# Patient Record
Sex: Male | Born: 1935 | Race: White | Hispanic: No | Smoking: Current some day smoker
Health system: Southern US, Community
[De-identification: ages and names within clinical notes are randomized; demographics above are authoritative.]

## PROBLEM LIST (undated history)

## (undated) DIAGNOSIS — N189 Chronic kidney disease, unspecified: Secondary | ICD-10-CM

## (undated) DIAGNOSIS — R569 Unspecified convulsions: Secondary | ICD-10-CM

## (undated) DIAGNOSIS — F419 Anxiety disorder, unspecified: Secondary | ICD-10-CM

## (undated) DIAGNOSIS — I10 Essential (primary) hypertension: Secondary | ICD-10-CM

## (undated) DIAGNOSIS — N390 Urinary tract infection, site not specified: Secondary | ICD-10-CM

## (undated) DIAGNOSIS — F32A Depression, unspecified: Secondary | ICD-10-CM

## (undated) DIAGNOSIS — F329 Major depressive disorder, single episode, unspecified: Secondary | ICD-10-CM

## (undated) DIAGNOSIS — R7881 Bacteremia: Secondary | ICD-10-CM

## (undated) DIAGNOSIS — D649 Anemia, unspecified: Secondary | ICD-10-CM

## (undated) DIAGNOSIS — D472 Monoclonal gammopathy: Secondary | ICD-10-CM

## (undated) DIAGNOSIS — Z978 Presence of other specified devices: Secondary | ICD-10-CM

## (undated) DIAGNOSIS — K5792 Diverticulitis of intestine, part unspecified, without perforation or abscess without bleeding: Secondary | ICD-10-CM

## (undated) DIAGNOSIS — Z960 Presence of urogenital implants: Secondary | ICD-10-CM

## (undated) DIAGNOSIS — N400 Enlarged prostate without lower urinary tract symptoms: Secondary | ICD-10-CM

## (undated) DIAGNOSIS — N289 Disorder of kidney and ureter, unspecified: Secondary | ICD-10-CM

## (undated) DIAGNOSIS — F4310 Post-traumatic stress disorder, unspecified: Secondary | ICD-10-CM

## (undated) DIAGNOSIS — J189 Pneumonia, unspecified organism: Secondary | ICD-10-CM

## (undated) HISTORY — PX: TONSILLECTOMY: SUR1361

---

## 2006-08-28 ENCOUNTER — Inpatient Hospital Stay: Payer: Self-pay | Admitting: Internal Medicine

## 2006-08-28 ENCOUNTER — Other Ambulatory Visit: Payer: Self-pay

## 2007-08-25 ENCOUNTER — Inpatient Hospital Stay: Payer: Self-pay | Admitting: Cardiovascular Disease

## 2007-08-25 ENCOUNTER — Other Ambulatory Visit: Payer: Self-pay

## 2007-09-21 ENCOUNTER — Ambulatory Visit: Payer: Self-pay | Admitting: Internal Medicine

## 2007-09-22 ENCOUNTER — Ambulatory Visit: Payer: Self-pay | Admitting: Internal Medicine

## 2008-10-17 ENCOUNTER — Ambulatory Visit: Payer: Self-pay | Admitting: Internal Medicine

## 2008-10-23 ENCOUNTER — Emergency Department: Payer: Self-pay | Admitting: Emergency Medicine

## 2008-11-02 ENCOUNTER — Emergency Department: Payer: Self-pay | Admitting: Emergency Medicine

## 2008-11-20 ENCOUNTER — Ambulatory Visit: Payer: Self-pay | Admitting: Ophthalmology

## 2009-07-06 ENCOUNTER — Inpatient Hospital Stay: Payer: Self-pay | Admitting: Internal Medicine

## 2009-07-20 ENCOUNTER — Other Ambulatory Visit: Payer: Self-pay | Admitting: Family Medicine

## 2009-09-22 ENCOUNTER — Inpatient Hospital Stay: Payer: Self-pay | Admitting: Specialist

## 2009-10-01 ENCOUNTER — Emergency Department: Payer: Self-pay | Admitting: Internal Medicine

## 2009-12-05 ENCOUNTER — Ambulatory Visit: Payer: Self-pay | Admitting: Internal Medicine

## 2009-12-11 ENCOUNTER — Inpatient Hospital Stay: Payer: Self-pay | Admitting: Internal Medicine

## 2010-01-04 ENCOUNTER — Ambulatory Visit: Payer: Self-pay | Admitting: Internal Medicine

## 2010-01-16 ENCOUNTER — Ambulatory Visit: Payer: Self-pay | Admitting: Internal Medicine

## 2010-02-04 ENCOUNTER — Ambulatory Visit: Payer: Self-pay | Admitting: Internal Medicine

## 2010-03-07 ENCOUNTER — Ambulatory Visit: Payer: Self-pay | Admitting: Internal Medicine

## 2010-04-06 ENCOUNTER — Ambulatory Visit: Payer: Self-pay | Admitting: Internal Medicine

## 2010-04-18 ENCOUNTER — Ambulatory Visit: Payer: Self-pay | Admitting: Internal Medicine

## 2010-05-07 ENCOUNTER — Ambulatory Visit: Payer: Self-pay | Admitting: Internal Medicine

## 2010-11-01 ENCOUNTER — Ambulatory Visit: Payer: Self-pay | Admitting: Internal Medicine

## 2010-11-05 ENCOUNTER — Ambulatory Visit: Payer: Self-pay | Admitting: Internal Medicine

## 2011-08-02 ENCOUNTER — Emergency Department: Payer: Self-pay | Admitting: *Deleted

## 2011-08-05 ENCOUNTER — Ambulatory Visit: Payer: Self-pay | Admitting: Internal Medicine

## 2011-08-05 LAB — CBC CANCER CENTER
Basophil %: 0.3 %
Eosinophil #: 0 x10 3/mm (ref 0.0–0.7)
Eosinophil %: 0.3 %
HGB: 12.7 g/dL — ABNORMAL LOW (ref 13.0–18.0)
Lymphocyte #: 0.9 x10 3/mm — ABNORMAL LOW (ref 1.0–3.6)
MCH: 32.1 pg (ref 26.0–34.0)
MCHC: 33.3 g/dL (ref 32.0–36.0)
MCV: 97 fL (ref 80–100)
Monocyte #: 0.5 x10 3/mm (ref 0.0–0.7)
Neutrophil #: 9.2 x10 3/mm — ABNORMAL HIGH (ref 1.4–6.5)
Neutrophil %: 87 %
RBC: 3.96 10*6/uL — ABNORMAL LOW (ref 4.40–5.90)

## 2011-08-05 LAB — COMPREHENSIVE METABOLIC PANEL
Albumin: 2.8 g/dL — ABNORMAL LOW (ref 3.4–5.0)
Anion Gap: 7 (ref 7–16)
Calcium, Total: 9.3 mg/dL (ref 8.5–10.1)
Glucose: 132 mg/dL — ABNORMAL HIGH (ref 65–99)
Osmolality: 285 (ref 275–301)
Potassium: 4.4 mmol/L (ref 3.5–5.1)
Sodium: 139 mmol/L (ref 136–145)
Total Protein: 8.8 g/dL — ABNORMAL HIGH (ref 6.4–8.2)

## 2011-08-08 ENCOUNTER — Ambulatory Visit: Payer: Self-pay | Admitting: Internal Medicine

## 2011-08-19 LAB — CBC CANCER CENTER
Bands: 1 %
Comment - H1-Com3: NORMAL
Eosinophil %: 1 %
Eosinophil: 3 %
HCT: 32.4 % — ABNORMAL LOW (ref 40.0–52.0)
HGB: 11.2 g/dL — ABNORMAL LOW (ref 13.0–18.0)
Lymphocyte #: 1.6 x10 3/mm (ref 1.0–3.6)
Lymphocyte %: 16.5 %
Lymphocytes: 17 %
Monocyte %: 6.3 %
Monocytes: 4 %
Neutrophil #: 7.5 x10 3/mm — ABNORMAL HIGH (ref 1.4–6.5)
Neutrophil %: 75.8 %
RDW: 15.4 % — ABNORMAL HIGH (ref 11.5–14.5)
WBC: 9.8 x10 3/mm (ref 3.8–10.6)

## 2011-08-19 LAB — RETICULOCYTES
Absolute Retic Count: 0.107 10*6/uL — ABNORMAL HIGH (ref 0.024–0.084)
Reticulocyte: 3.1 % — ABNORMAL HIGH (ref 0.5–1.5)

## 2011-09-05 ENCOUNTER — Ambulatory Visit: Payer: Self-pay | Admitting: Internal Medicine

## 2011-12-06 ENCOUNTER — Ambulatory Visit: Payer: Self-pay | Admitting: Internal Medicine

## 2011-12-20 ENCOUNTER — Inpatient Hospital Stay: Payer: Self-pay | Admitting: Internal Medicine

## 2011-12-20 LAB — DRUG SCREEN, URINE
Amphetamines, Ur Screen: NEGATIVE (ref ?–1000)
Benzodiazepine, Ur Scrn: POSITIVE (ref ?–200)
Cannabinoid 50 Ng, Ur ~~LOC~~: NEGATIVE (ref ?–50)
Cocaine Metabolite,Ur ~~LOC~~: NEGATIVE (ref ?–300)
Methadone, Ur Screen: NEGATIVE (ref ?–300)
Phencyclidine (PCP) Ur S: NEGATIVE (ref ?–25)
Tricyclic, Ur Screen: NEGATIVE (ref ?–1000)

## 2011-12-20 LAB — COMPREHENSIVE METABOLIC PANEL
Alkaline Phosphatase: 63 U/L (ref 50–136)
Anion Gap: 5 — ABNORMAL LOW (ref 7–16)
Creatinine: 1.4 mg/dL — ABNORMAL HIGH (ref 0.60–1.30)
EGFR (African American): 52 — ABNORMAL LOW
EGFR (Non-African Amer.): 45 — ABNORMAL LOW
Glucose: 81 mg/dL (ref 65–99)
Osmolality: 283 (ref 275–301)
SGOT(AST): 30 U/L (ref 15–37)
Total Protein: 8.2 g/dL (ref 6.4–8.2)

## 2011-12-20 LAB — CBC WITH DIFFERENTIAL/PLATELET
Basophil #: 0 10*3/uL (ref 0.0–0.1)
Basophil %: 0.3 %
Eosinophil %: 1.2 %
Lymphocyte #: 1.1 10*3/uL (ref 1.0–3.6)
Neutrophil %: 78.3 %
Platelet: 182 10*3/uL (ref 150–440)

## 2011-12-20 LAB — TROPONIN I: Troponin-I: 0.02 ng/mL

## 2011-12-20 LAB — URINALYSIS, COMPLETE
Glucose,UR: NEGATIVE mg/dL (ref 0–75)
Ketone: NEGATIVE
Ph: 5 (ref 4.5–8.0)
RBC,UR: 76 /HPF (ref 0–5)
Squamous Epithelial: <1

## 2011-12-21 LAB — MAGNESIUM: Magnesium: 1.9 mg/dL

## 2011-12-21 LAB — BASIC METABOLIC PANEL
BUN: 25 mg/dL — ABNORMAL HIGH (ref 7–18)
Calcium, Total: 8.3 mg/dL — ABNORMAL LOW (ref 8.5–10.1)
Chloride: 110 mmol/L — ABNORMAL HIGH (ref 98–107)
Co2: 28 mmol/L (ref 21–32)
EGFR (African American): 49 — ABNORMAL LOW
Glucose: 82 mg/dL (ref 65–99)
Potassium: 4 mmol/L (ref 3.5–5.1)
Sodium: 143 mmol/L (ref 136–145)

## 2011-12-21 LAB — LIPID PANEL: Cholesterol: 123 mg/dL (ref 0–200)

## 2011-12-21 LAB — CBC WITH DIFFERENTIAL/PLATELET
Basophil %: 3.2 %
Eosinophil #: 0.1 10*3/uL (ref 0.0–0.7)
Eosinophil %: 2.1 %
HGB: 12.2 g/dL — ABNORMAL LOW (ref 13.0–18.0)
Lymphocyte %: 13.4 %
MCH: 32 pg (ref 26.0–34.0)
MCHC: 33 g/dL (ref 32.0–36.0)
Monocyte %: 7.4 %
RDW: 14.2 % (ref 11.5–14.5)

## 2011-12-22 LAB — CBC WITH DIFFERENTIAL/PLATELET
Basophil #: 0 10*3/uL (ref 0.0–0.1)
Basophil %: 0.3 %
Eosinophil %: 0.9 %
Lymphocyte #: 0.6 10*3/uL — ABNORMAL LOW (ref 1.0–3.6)
Lymphocyte %: 9 %
MCV: 97 fL (ref 80–100)
Monocyte #: 0.5 x10 3/mm (ref 0.2–1.0)
Neutrophil %: 82 %
WBC: 7 10*3/uL (ref 3.8–10.6)

## 2011-12-22 LAB — URINE CULTURE

## 2011-12-22 LAB — BASIC METABOLIC PANEL
Creatinine: 1.36 mg/dL — ABNORMAL HIGH (ref 0.60–1.30)
EGFR (African American): 54 — ABNORMAL LOW
EGFR (Non-African Amer.): 46 — ABNORMAL LOW
Osmolality: 288 (ref 275–301)
Potassium: 3.5 mmol/L (ref 3.5–5.1)

## 2011-12-22 LAB — PHENYTOIN LEVEL, TOTAL: Dilantin: 12.6 ug/mL (ref 10.0–20.0)

## 2011-12-23 LAB — CBC WITH DIFFERENTIAL/PLATELET
Basophil #: 0 10*3/uL (ref 0.0–0.1)
Eosinophil %: 1.4 %
HCT: 35.1 % — ABNORMAL LOW (ref 40.0–52.0)
Lymphocyte #: 0.5 10*3/uL — ABNORMAL LOW (ref 1.0–3.6)
Lymphocyte %: 6.9 %
MCHC: 32.4 g/dL (ref 32.0–36.0)
Monocyte %: 5.5 %
Neutrophil #: 6.8 10*3/uL — ABNORMAL HIGH (ref 1.4–6.5)
Neutrophil %: 86 %
Platelet: 131 10*3/uL — ABNORMAL LOW (ref 150–440)
RDW: 14.7 % — ABNORMAL HIGH (ref 11.5–14.5)

## 2011-12-23 LAB — BASIC METABOLIC PANEL
Anion Gap: 7 (ref 7–16)
Calcium, Total: 7.9 mg/dL — ABNORMAL LOW (ref 8.5–10.1)
Chloride: 111 mmol/L — ABNORMAL HIGH (ref 98–107)
Co2: 23 mmol/L (ref 21–32)
EGFR (African American): 46 — ABNORMAL LOW
Glucose: 139 mg/dL — ABNORMAL HIGH (ref 65–99)
Osmolality: 285 (ref 275–301)

## 2011-12-24 LAB — BASIC METABOLIC PANEL WITH GFR
Anion Gap: 10
BUN: 20 mg/dL — ABNORMAL HIGH
Calcium, Total: 8.5 mg/dL
Chloride: 110 mmol/L — ABNORMAL HIGH
Co2: 20 mmol/L — ABNORMAL LOW
Creatinine: 1.36 mg/dL — ABNORMAL HIGH
EGFR (African American): 54 — ABNORMAL LOW
EGFR (Non-African Amer.): 46 — ABNORMAL LOW
Glucose: 89 mg/dL
Osmolality: 281
Potassium: 3.5 mmol/L
Sodium: 140 mmol/L

## 2011-12-25 LAB — CULTURE, BLOOD (SINGLE)

## 2011-12-31 ENCOUNTER — Inpatient Hospital Stay: Payer: Self-pay | Admitting: Internal Medicine

## 2011-12-31 ENCOUNTER — Other Ambulatory Visit: Payer: Self-pay

## 2011-12-31 LAB — URINALYSIS, COMPLETE
Ketone: NEGATIVE
Ph: 5 (ref 4.5–8.0)
Protein: NEGATIVE
Squamous Epithelial: NONE SEEN
WBC UR: 6 /HPF (ref 0–5)

## 2011-12-31 LAB — BASIC METABOLIC PANEL
Anion Gap: 10 (ref 7–16)
BUN: 68 mg/dL — ABNORMAL HIGH (ref 7–18)
Calcium, Total: 8.3 mg/dL — ABNORMAL LOW (ref 8.5–10.1)
Co2: 21 mmol/L (ref 21–32)
EGFR (African American): 8 — ABNORMAL LOW
EGFR (Non-African Amer.): 7 — ABNORMAL LOW
Glucose: 96 mg/dL (ref 65–99)
Osmolality: 301 (ref 275–301)
Potassium: 4.1 mmol/L (ref 3.5–5.1)
Sodium: 141 mmol/L (ref 136–145)

## 2011-12-31 LAB — CBC
HCT: 28.6 % — ABNORMAL LOW (ref 40.0–52.0)
HGB: 9.5 g/dL — ABNORMAL LOW (ref 13.0–18.0)
MCH: 32.1 pg (ref 26.0–34.0)
MCHC: 33.1 g/dL (ref 32.0–36.0)
Platelet: 204 10*3/uL (ref 150–440)
RDW: 14.6 % — ABNORMAL HIGH (ref 11.5–14.5)
WBC: 6.3 10*3/uL (ref 3.8–10.6)

## 2011-12-31 LAB — CBC WITH DIFFERENTIAL/PLATELET
Eosinophil %: 2.6 %
HCT: 31.9 % — ABNORMAL LOW (ref 40.0–52.0)
HGB: 10.6 g/dL — ABNORMAL LOW (ref 13.0–18.0)
Lymphocyte #: 0.6 10*3/uL — ABNORMAL LOW (ref 1.0–3.6)
MCH: 32.2 pg (ref 26.0–34.0)
MCHC: 33.3 g/dL (ref 32.0–36.0)
Monocyte #: 0.4 x10 3/mm (ref 0.2–1.0)
Monocyte %: 6.3 %
Neutrophil #: 5.7 10*3/uL (ref 1.4–6.5)
Platelet: 208 10*3/uL (ref 150–440)
RDW: 14.9 % — ABNORMAL HIGH (ref 11.5–14.5)
WBC: 7 10*3/uL (ref 3.8–10.6)

## 2011-12-31 LAB — COMPREHENSIVE METABOLIC PANEL
BUN: 73 mg/dL — ABNORMAL HIGH (ref 7–18)
Calcium, Total: 8.1 mg/dL — ABNORMAL LOW (ref 8.5–10.1)
Chloride: 110 mmol/L — ABNORMAL HIGH (ref 98–107)
Creatinine: 7.4 mg/dL — ABNORMAL HIGH (ref 0.60–1.30)
EGFR (African American): 7 — ABNORMAL LOW
Glucose: 94 mg/dL (ref 65–99)
Osmolality: 299 (ref 275–301)
Potassium: 4.5 mmol/L (ref 3.5–5.1)

## 2012-01-01 LAB — CBC WITH DIFFERENTIAL/PLATELET
Basophil #: 0 10*3/uL (ref 0.0–0.1)
Eosinophil #: 0.2 10*3/uL (ref 0.0–0.7)
Eosinophil %: 2.9 %
HCT: 29.4 % — ABNORMAL LOW (ref 40.0–52.0)
Lymphocyte #: 0.7 10*3/uL — ABNORMAL LOW (ref 1.0–3.6)
Lymphocyte %: 11.3 %
MCH: 32.3 pg (ref 26.0–34.0)
Monocyte #: 0.5 x10 3/mm (ref 0.2–1.0)
Neutrophil #: 4.5 10*3/uL (ref 1.4–6.5)
Neutrophil %: 76.7 %
Platelet: 194 10*3/uL (ref 150–440)
RBC: 3.06 10*6/uL — ABNORMAL LOW (ref 4.40–5.90)
WBC: 5.9 10*3/uL (ref 3.8–10.6)

## 2012-01-01 LAB — BASIC METABOLIC PANEL
Calcium, Total: 7.9 mg/dL — ABNORMAL LOW (ref 8.5–10.1)
Chloride: 114 mmol/L — ABNORMAL HIGH (ref 98–107)
Co2: 22 mmol/L (ref 21–32)
Creatinine: 4.99 mg/dL — ABNORMAL HIGH (ref 0.60–1.30)
EGFR (African American): 11 — ABNORMAL LOW
EGFR (Non-African Amer.): 10 — ABNORMAL LOW
Glucose: 89 mg/dL (ref 65–99)
Osmolality: 304 (ref 275–301)
Potassium: 3.9 mmol/L (ref 3.5–5.1)

## 2012-01-01 LAB — MAGNESIUM: Magnesium: 2 mg/dL

## 2012-01-01 LAB — PROTIME-INR: INR: 1.1

## 2012-01-02 LAB — BASIC METABOLIC PANEL
Anion Gap: 9 (ref 7–16)
BUN: 36 mg/dL — ABNORMAL HIGH (ref 7–18)
Calcium, Total: 8 mg/dL — ABNORMAL LOW (ref 8.5–10.1)
Chloride: 111 mmol/L — ABNORMAL HIGH (ref 98–107)
Creatinine: 1.74 mg/dL — ABNORMAL HIGH (ref 0.60–1.30)
EGFR (African American): 40 — ABNORMAL LOW
EGFR (Non-African Amer.): 34 — ABNORMAL LOW
Glucose: 89 mg/dL (ref 65–99)
Osmolality: 291 (ref 275–301)
Sodium: 142 mmol/L (ref 136–145)

## 2012-01-02 LAB — URINE CULTURE

## 2012-01-02 LAB — CBC WITH DIFFERENTIAL/PLATELET
Basophil %: 0.4 %
Eosinophil #: 0.1 10*3/uL (ref 0.0–0.7)
Eosinophil %: 1.6 %
HCT: 29.1 % — ABNORMAL LOW (ref 40.0–52.0)
HGB: 9.7 g/dL — ABNORMAL LOW (ref 13.0–18.0)
Lymphocyte %: 9.2 %
MCV: 97 fL (ref 80–100)
Monocyte #: 0.6 x10 3/mm (ref 0.2–1.0)
Monocyte %: 8.3 %
Neutrophil %: 80.5 %

## 2012-01-03 LAB — BASIC METABOLIC PANEL
Anion Gap: 8 (ref 7–16)
BUN: 27 mg/dL — ABNORMAL HIGH (ref 7–18)
Creatinine: 1.29 mg/dL (ref 0.60–1.30)
Osmolality: 288 (ref 275–301)
Potassium: 3.8 mmol/L (ref 3.5–5.1)

## 2012-01-05 ENCOUNTER — Ambulatory Visit: Payer: Self-pay | Admitting: Internal Medicine

## 2012-01-11 ENCOUNTER — Inpatient Hospital Stay (HOSPITAL_COMMUNITY): Payer: Medicare Other

## 2012-01-11 ENCOUNTER — Emergency Department (HOSPITAL_COMMUNITY): Payer: Medicare Other

## 2012-01-11 ENCOUNTER — Encounter (HOSPITAL_COMMUNITY): Payer: Self-pay

## 2012-01-11 ENCOUNTER — Inpatient Hospital Stay (HOSPITAL_COMMUNITY)
Admission: EM | Admit: 2012-01-11 | Discharge: 2012-01-19 | DRG: 872 | Disposition: A | Discharge status: Skilled Nursing Facility | Payer: Medicare Other | Attending: Internal Medicine | Admitting: Internal Medicine

## 2012-01-11 DIAGNOSIS — R310 Gross hematuria: Secondary | ICD-10-CM | POA: Diagnosis present

## 2012-01-11 DIAGNOSIS — R0603 Acute respiratory distress: Secondary | ICD-10-CM

## 2012-01-11 DIAGNOSIS — N390 Urinary tract infection, site not specified: Secondary | ICD-10-CM

## 2012-01-11 DIAGNOSIS — F329 Major depressive disorder, single episode, unspecified: Secondary | ICD-10-CM | POA: Diagnosis present

## 2012-01-11 DIAGNOSIS — R4182 Altered mental status, unspecified: Secondary | ICD-10-CM | POA: Diagnosis present

## 2012-01-11 DIAGNOSIS — A419 Sepsis, unspecified organism: Principal | ICD-10-CM

## 2012-01-11 DIAGNOSIS — F411 Generalized anxiety disorder: Secondary | ICD-10-CM | POA: Diagnosis present

## 2012-01-11 DIAGNOSIS — J90 Pleural effusion, not elsewhere classified: Secondary | ICD-10-CM | POA: Diagnosis present

## 2012-01-11 DIAGNOSIS — N1330 Unspecified hydronephrosis: Secondary | ICD-10-CM | POA: Diagnosis present

## 2012-01-11 DIAGNOSIS — R319 Hematuria, unspecified: Secondary | ICD-10-CM

## 2012-01-11 DIAGNOSIS — N400 Enlarged prostate without lower urinary tract symptoms: Secondary | ICD-10-CM | POA: Diagnosis present

## 2012-01-11 DIAGNOSIS — E871 Hypo-osmolality and hyponatremia: Secondary | ICD-10-CM

## 2012-01-11 DIAGNOSIS — I10 Essential (primary) hypertension: Secondary | ICD-10-CM

## 2012-01-11 DIAGNOSIS — D72829 Elevated white blood cell count, unspecified: Secondary | ICD-10-CM

## 2012-01-11 DIAGNOSIS — E860 Dehydration: Secondary | ICD-10-CM

## 2012-01-11 DIAGNOSIS — N289 Disorder of kidney and ureter, unspecified: Secondary | ICD-10-CM

## 2012-01-11 DIAGNOSIS — M549 Dorsalgia, unspecified: Secondary | ICD-10-CM

## 2012-01-11 DIAGNOSIS — D649 Anemia, unspecified: Secondary | ICD-10-CM | POA: Diagnosis present

## 2012-01-11 DIAGNOSIS — K5900 Constipation, unspecified: Secondary | ICD-10-CM | POA: Diagnosis present

## 2012-01-11 DIAGNOSIS — N183 Chronic kidney disease, stage 3 unspecified: Secondary | ICD-10-CM

## 2012-01-11 DIAGNOSIS — F3289 Other specified depressive episodes: Secondary | ICD-10-CM | POA: Diagnosis present

## 2012-01-11 DIAGNOSIS — N210 Calculus in bladder: Secondary | ICD-10-CM | POA: Diagnosis present

## 2012-01-11 DIAGNOSIS — N179 Acute kidney failure, unspecified: Secondary | ICD-10-CM | POA: Diagnosis present

## 2012-01-11 DIAGNOSIS — R9431 Abnormal electrocardiogram [ECG] [EKG]: Secondary | ICD-10-CM

## 2012-01-11 DIAGNOSIS — I129 Hypertensive chronic kidney disease with stage 1 through stage 4 chronic kidney disease, or unspecified chronic kidney disease: Secondary | ICD-10-CM | POA: Diagnosis present

## 2012-01-11 DIAGNOSIS — B9561 Methicillin susceptible Staphylococcus aureus infection as the cause of diseases classified elsewhere: Secondary | ICD-10-CM

## 2012-01-11 DIAGNOSIS — N401 Enlarged prostate with lower urinary tract symptoms: Secondary | ICD-10-CM | POA: Diagnosis present

## 2012-01-11 DIAGNOSIS — D509 Iron deficiency anemia, unspecified: Secondary | ICD-10-CM | POA: Diagnosis present

## 2012-01-11 DIAGNOSIS — R5381 Other malaise: Secondary | ICD-10-CM | POA: Diagnosis present

## 2012-01-11 DIAGNOSIS — J984 Other disorders of lung: Secondary | ICD-10-CM | POA: Diagnosis present

## 2012-01-11 DIAGNOSIS — A4902 Methicillin resistant Staphylococcus aureus infection, unspecified site: Secondary | ICD-10-CM | POA: Diagnosis present

## 2012-01-11 DIAGNOSIS — I7090 Unspecified atherosclerosis: Secondary | ICD-10-CM | POA: Diagnosis present

## 2012-01-11 DIAGNOSIS — K5730 Diverticulosis of large intestine without perforation or abscess without bleeding: Secondary | ICD-10-CM | POA: Diagnosis present

## 2012-01-11 DIAGNOSIS — F17200 Nicotine dependence, unspecified, uncomplicated: Secondary | ICD-10-CM | POA: Diagnosis present

## 2012-01-11 DIAGNOSIS — N138 Other obstructive and reflux uropathy: Secondary | ICD-10-CM | POA: Diagnosis present

## 2012-01-11 DIAGNOSIS — J9819 Other pulmonary collapse: Secondary | ICD-10-CM | POA: Diagnosis present

## 2012-01-11 DIAGNOSIS — R531 Weakness: Secondary | ICD-10-CM

## 2012-01-11 DIAGNOSIS — K449 Diaphragmatic hernia without obstruction or gangrene: Secondary | ICD-10-CM | POA: Diagnosis present

## 2012-01-11 DIAGNOSIS — D62 Acute posthemorrhagic anemia: Secondary | ICD-10-CM

## 2012-01-11 DIAGNOSIS — R7881 Bacteremia: Secondary | ICD-10-CM | POA: Diagnosis present

## 2012-01-11 HISTORY — DX: Chronic kidney disease, unspecified: N18.9

## 2012-01-11 HISTORY — DX: Monoclonal gammopathy: D47.2

## 2012-01-11 HISTORY — DX: Major depressive disorder, single episode, unspecified: F32.9

## 2012-01-11 HISTORY — DX: Anxiety disorder, unspecified: F41.9

## 2012-01-11 HISTORY — DX: Depression, unspecified: F32.A

## 2012-01-11 HISTORY — DX: Essential (primary) hypertension: I10

## 2012-01-11 HISTORY — DX: Post-traumatic stress disorder, unspecified: F43.10

## 2012-01-11 HISTORY — DX: Diverticulitis of intestine, part unspecified, without perforation or abscess without bleeding: K57.92

## 2012-01-11 HISTORY — DX: Unspecified convulsions: R56.9

## 2012-01-11 HISTORY — DX: Pneumonia, unspecified organism: J18.9

## 2012-01-11 HISTORY — DX: Anemia, unspecified: D64.9

## 2012-01-11 HISTORY — DX: Disorder of kidney and ureter, unspecified: N28.9

## 2012-01-11 HISTORY — DX: Benign prostatic hyperplasia without lower urinary tract symptoms: N40.0

## 2012-01-11 LAB — LACTIC ACID, PLASMA: Lactic Acid, Venous: 2.7 mmol/L — ABNORMAL HIGH (ref 0.5–2.2)

## 2012-01-11 LAB — COMPREHENSIVE METABOLIC PANEL
ALT: 12 U/L (ref 0–53)
AST: 18 U/L (ref 0–37)
Alkaline Phosphatase: 67 U/L (ref 39–117)
CO2: 19 mEq/L (ref 19–32)
Calcium: 9.1 mg/dL (ref 8.4–10.5)
Chloride: 98 mEq/L (ref 96–112)
GFR calc Af Amer: 14 mL/min — ABNORMAL LOW (ref >90)
GFR calc non Af Amer: 12 mL/min — ABNORMAL LOW (ref >90)
Glucose, Bld: 134 mg/dL — ABNORMAL HIGH (ref 70–99)
Potassium: 4.8 mEq/L (ref 3.5–5.1)
Sodium: 130 mEq/L — ABNORMAL LOW (ref 135–145)

## 2012-01-11 LAB — CBC WITH DIFFERENTIAL/PLATELET
Basophils Relative: 0 % (ref 0–1)
Eosinophils Relative: 0 % (ref 0–5)
Hemoglobin: 9.2 g/dL — ABNORMAL LOW (ref 13.0–17.0)
Lymphocytes Relative: 1 % — ABNORMAL LOW (ref 12–46)
MCH: 31.7 pg (ref 26.0–34.0)
Monocytes Absolute: 1.1 10*3/uL — ABNORMAL HIGH (ref 0.1–1.0)
Monocytes Relative: 5 % (ref 3–12)
Neutrophils Relative %: 94 % — ABNORMAL HIGH (ref 43–77)
Platelets: 299 10*3/uL (ref 150–400)
RBC: 2.9 MIL/uL — ABNORMAL LOW (ref 4.22–5.81)
WBC: 21.7 10*3/uL — ABNORMAL HIGH (ref 4.0–10.5)

## 2012-01-11 LAB — URINALYSIS, ROUTINE W REFLEX MICROSCOPIC
Ketones, ur: 15 mg/dL — AB
Specific Gravity, Urine: 1.018 (ref 1.005–1.030)
Urobilinogen, UA: 0.2 mg/dL (ref 0.0–1.0)

## 2012-01-11 LAB — CARDIAC PANEL(CRET KIN+CKTOT+MB+TROPI)
CK, MB: 3.2 ng/mL (ref 0.3–4.0)
CK, MB: 2.2 ng/mL (ref 0.3–4.0)
Relative Index: INVALID (ref 0.0–2.5)
Relative Index: INVALID (ref 0.0–2.5)
Troponin I: <0.3 ng/mL (ref <0.30)
Troponin I: <0.3 ng/mL (ref <0.30)

## 2012-01-11 LAB — VITAMIN B12: Vitamin B-12: 373 pg/mL (ref 211–911)

## 2012-01-11 LAB — FOLATE: Folate: 19.3 ng/mL

## 2012-01-11 LAB — PROTIME-INR: INR: 1.29 (ref 0.00–1.49)

## 2012-01-11 LAB — IRON AND TIBC: Iron: <10 ug/dL — ABNORMAL LOW (ref 42–135)

## 2012-01-11 LAB — PROCALCITONIN: Procalcitonin: 33.3 ng/mL

## 2012-01-11 LAB — APTT: aPTT: 36 seconds (ref 24–37)

## 2012-01-11 LAB — URINE MICROSCOPIC-ADD ON

## 2012-01-11 LAB — SODIUM, URINE, RANDOM: Sodium, Ur: 42 mEq/L

## 2012-01-11 MED ORDER — SODIUM CHLORIDE 0.9 % IJ SOLN
3.0000 mL | Freq: Two times a day (BID) | INTRAMUSCULAR | Status: DC
  Administered 2012-01-12: 10 mL via INTRAVENOUS
  Administered 2012-01-13 – 2012-01-17 (×4): 3 mL via INTRAVENOUS

## 2012-01-11 MED ORDER — LEVALBUTEROL HCL 1.25 MG/0.5ML IN NEBU
1.2500 mg | INHALATION_SOLUTION | Freq: Once | RESPIRATORY_TRACT | Status: AC
  Administered 2012-01-12: 1.25 mg via RESPIRATORY_TRACT
  Filled 2012-01-11: qty 0.5

## 2012-01-11 MED ORDER — ONDANSETRON HCL 4 MG/2ML IJ SOLN: 4.0000 mg | Freq: Four times a day (QID) | INTRAMUSCULAR | Status: DC | PRN

## 2012-01-11 MED ORDER — SERTRALINE HCL 100 MG PO TABS
150.0000 mg | ORAL_TABLET | Freq: Every day | ORAL | Status: DC
  Administered 2012-01-12 – 2012-01-19 (×8): 150 mg via ORAL
  Filled 2012-01-11 (×8): qty 1

## 2012-01-11 MED ORDER — TAMSULOSIN HCL 0.4 MG PO CAPS
0.4000 mg | ORAL_CAPSULE | Freq: Every day | ORAL | Status: DC
  Administered 2012-01-11 – 2012-01-18 (×8): 0.4 mg via ORAL
  Filled 2012-01-11 (×9): qty 1

## 2012-01-11 MED ORDER — SODIUM CHLORIDE 0.9 % IV BOLUS (SEPSIS)
500.0000 mL | Freq: Once | INTRAVENOUS | Status: AC
  Administered 2012-01-11: 500 mL via INTRAVENOUS

## 2012-01-11 MED ORDER — ALPRAZOLAM 0.5 MG PO TABS
0.5000 mg | ORAL_TABLET | Freq: Three times a day (TID) | ORAL | Status: DC | PRN
  Administered 2012-01-12 – 2012-01-17 (×7): 0.5 mg via ORAL
  Filled 2012-01-11 (×7): qty 1

## 2012-01-11 MED ORDER — PIPERACILLIN-TAZOBACTAM IN DEX 2-0.25 GM/50ML IV SOLN
2.2500 g | Freq: Three times a day (TID) | INTRAVENOUS | Status: DC
  Administered 2012-01-11 – 2012-01-12 (×2): 2.25 g via INTRAVENOUS
  Filled 2012-01-11 (×4): qty 50

## 2012-01-11 MED ORDER — BISACODYL 5 MG PO TBEC
10.0000 mg | DELAYED_RELEASE_TABLET | Freq: Every day | ORAL | Status: DC | PRN
  Filled 2012-01-11: qty 2

## 2012-01-11 MED ORDER — POLYETHYLENE GLYCOL 3350 17 G PO PACK
17.0000 g | PACK | Freq: Every day | ORAL | Status: DC | PRN
  Filled 2012-01-11: qty 1

## 2012-01-11 MED ORDER — OXYCODONE HCL 5 MG PO TABS
5.0000 mg | ORAL_TABLET | ORAL | Status: DC | PRN
  Administered 2012-01-15 – 2012-01-19 (×8): 5 mg via ORAL
  Filled 2012-01-11 (×8): qty 1

## 2012-01-11 MED ORDER — CHLORHEXIDINE GLUCONATE CLOTH 2 % EX PADS
6.0000 | MEDICATED_PAD | Freq: Every day | CUTANEOUS | Status: AC
  Administered 2012-01-13 – 2012-01-15 (×3): 6 via TOPICAL

## 2012-01-11 MED ORDER — ACETAMINOPHEN 325 MG PO TABS
650.0000 mg | ORAL_TABLET | Freq: Four times a day (QID) | ORAL | Status: DC | PRN
  Administered 2012-01-12 – 2012-01-18 (×11): 650 mg via ORAL
  Filled 2012-01-11 (×4): qty 2
  Filled 2012-01-11: qty 1
  Filled 2012-01-11 (×4): qty 2
  Filled 2012-01-11: qty 1
  Filled 2012-01-11 (×2): qty 2

## 2012-01-11 MED ORDER — SODIUM CHLORIDE 0.9 % IV SOLN
INTRAVENOUS | Status: DC
  Administered 2012-01-11: 100 mL via INTRAVENOUS
  Administered 2012-01-12 – 2012-01-13 (×5): via INTRAVENOUS
  Administered 2012-01-18: 20 mL/h via INTRAVENOUS

## 2012-01-11 MED ORDER — SODIUM CHLORIDE 0.9 % IV SOLN: INTRAVENOUS | Status: DC

## 2012-01-11 MED ORDER — ACETAMINOPHEN 650 MG RE SUPP: 650.0000 mg | Freq: Four times a day (QID) | RECTAL | Status: DC | PRN

## 2012-01-11 MED ORDER — DEXTROSE 5 % IV SOLN
1.0000 g | Freq: Once | INTRAVENOUS | Status: AC
  Administered 2012-01-11: 1 g via INTRAVENOUS
  Filled 2012-01-11: qty 10

## 2012-01-11 MED ORDER — ONDANSETRON HCL 4 MG PO TABS: 4.0000 mg | ORAL_TABLET | Freq: Four times a day (QID) | ORAL | Status: DC | PRN

## 2012-01-11 MED ORDER — DUTASTERIDE 0.5 MG PO CAPS
0.5000 mg | ORAL_CAPSULE | Freq: Every day | ORAL | Status: DC
  Administered 2012-01-11 – 2012-01-19 (×9): 0.5 mg via ORAL
  Filled 2012-01-11 (×9): qty 1

## 2012-01-11 MED ORDER — PIPERACILLIN-TAZOBACTAM IN DEX 2-0.25 GM/50ML IV SOLN
2.2500 g | INTRAVENOUS | Status: AC
  Administered 2012-01-11: 2.25 g via INTRAVENOUS
  Filled 2012-01-11: qty 50

## 2012-01-11 MED ORDER — MUPIROCIN 2 % EX OINT
1.0000 "application " | TOPICAL_OINTMENT | Freq: Two times a day (BID) | CUTANEOUS | Status: AC
  Administered 2012-01-11 – 2012-01-16 (×10): 1 via NASAL
  Filled 2012-01-11 (×2): qty 22

## 2012-01-11 MED ORDER — ALUM & MAG HYDROXIDE-SIMETH 200-200-20 MG/5ML PO SUSP: 30.0000 mL | Freq: Four times a day (QID) | ORAL | Status: DC | PRN

## 2012-01-11 MED ORDER — PANTOPRAZOLE SODIUM 40 MG IV SOLR
40.0000 mg | INTRAVENOUS | Status: DC
  Administered 2012-01-12: 40 mg via INTRAVENOUS
  Filled 2012-01-11 (×2): qty 40

## 2012-01-11 NOTE — Progress Notes (Signed)
ANTIBIOTIC CONSULT NOTE - INITIAL  Pharmacy Consult for Zosyn Indication: urosepsis  No Known Allergies  Patient Measurements:    Vital Signs: Temp: 98.1 F (36.7 C) (07/07 1038) Temp src: Oral (07/07 1038) BP: 98/53 mmHg (07/07 1400) Pulse Rate: 107  (07/07 1400) Intake/Output from previous day:   Intake/Output from this shift: Total I/O In: -  Out: 250 [Urine:250]  Labs:  [facility] 01/11/12 1159  WBC 21.7*  HGB 9.2*  PLT 299  LABCREA --  CREATININE 3.95*   CrCl is unknown because there is no height on file for the current visit. No results found for this basename: VANCOTROUGH:2,VANCOPEAK:2,VANCORANDOM:2,GENTTROUGH:2,GENTPEAK:2,GENTRANDOM:2,TOBRATROUGH:2,TOBRAPEAK:2,TOBRARND:2,AMIKACINPEAK:2,AMIKACINTROU:2,AMIKACIN:2, in the last 72 hours   Microbiology: No results found for this or any previous visit (from the past 720 hour(s)).  Medical History: Past Medical History  Diagnosis Date  . Renal disorder   . Chronic renal failure   . BPH (benign prostatic hyperplasia)   . Seizure   . Hypertension   . Depression   . MGUS (monoclonal gammopathy of unknown significance)   . Anxiety   . PTSD (post-traumatic stress disorder)   . Diverticulitis   . Anemia   . Pneumonia    Assessment:  10 YOM with h/o stage III CKD presented with acute renal failure and worsening hematuria. UA consistent with UTI to start IV zosyn for r/o urosepsis.  Patient received Ceftriaxone 1gm x 1 in the ED at 1303  Baseline Scr 1.29 (01/06/12), Scr here 3.95. Patient reported weighing 150 lbs (68 kg) => CrCl 13 ml/min  WBC 21.7, afebrile  Urine and blood culture pending  Plan:   Zosyn 2.25 gm IV q8h   Pharmacy will f/u  Geoffry Paradise Thi 01/11/2012,3:36 PM

## 2012-01-11 NOTE — Consult Note (Signed)
Urology Consult  Referring physician: Dr. Ramiro Harvest Reason for referral: Gross hematuria  History of Present Illness: The patient is an 78 years old male who was seen in the emergency room and with a 2 days history of worsening hematuria. He has a history of renal insufficiency. He was discharged from [facility] on 01/06/2012 with a diagnosis of acute on chronic renal disease. His creatinine on 7/2 was 1.29. His creatinine today is 3.95 with a BUN of 69. He has a history of BPH and has a chronic indwelling Foley catheter that is draining bloody urine. His hemoglobin is 9.2 and white count is 21.7. No history can be obtained from the patient. He resides in a nursing home and most of the history was obtained from the emergency room records. The Foley catheter was replaced with a #24 French catheter and is now draining well. He has been on Avodart and tamsulosin.``  Past Medical History  Diagnosis Date  . Renal disorder   . Chronic renal failure   . BPH (benign prostatic hyperplasia)   . Seizure   . Hypertension   . Depression   . MGUS (monoclonal gammopathy of unknown significance)   . Anxiety   . PTSD (post-traumatic stress disorder)   . Diverticulitis   . Anemia   . Pneumonia    Past Surgical History  Procedure Date  . Tonsillectomy     Medications: Acetaminophen, alprazolam, amlodipine, Avodart, ferrous sulfate, hydrocodone/APAP 5/325, Lopressor, nystatin, Zofran, Nicoderm patch, Protonix, Zoloft, tamsulosin HCl, triamcinolone. Allergies: No Known Allergies  History reviewed. No pertinent family history. Social History:  reports that he has been smoking.  He does not have any smokeless tobacco history on file. He reports that he does not drink alcohol or use illicit drugs.  ROS: All systems are reviewed and negative except as noted.            No history can be obtained from the patient. He has a history of shortness of breath.  Physical Exam:  Vital signs  in last 24 hours: Temp:  [98.1 F (36.7 C)-98.6 F (37 C)] 98.6 F (37 C) (07/07 1615) Pulse Rate:  [76-121] 121  (07/07 1800) Resp:  [18-31] 31  (07/07 1800) BP: (98-148)/(47-83) 148/69 mmHg (07/07 1800) SpO2:  [93 %-98 %] 98 % (07/07 1800)  Ears nose and throat: Are within normal limits. Neck: Supple, no cervical adenopathy, no thyromegaly.  Cardiovascular: Skin warm; not flushed.  Respiratory: Breaths quiet; no shortness of breath.  Abdomen: No masses. Bladder is not distended. No hepatomegaly no splenomegaly.  Neurological: Normal sensation to touch Musculoskeletal: Normal motor function arms and legs Lymphatics: No inguinal adenopathy Skin: No rashes Genitourinary: Penis is uncircumcised. He has a three-way Foley catheter in place. Scrotum is normal in appearance. There is no testicular mass. Cords and epididymis are within normal limits. Rectal: Sphincter tone is normal; the prostate is markedly enlarged, 90 g. There are no nodules, no induration. Seminal vesicles are not palpable.  Laboratory Data:  Results for orders placed during the hospital encounter of 01/11/12 (from the past 72 hour(s))  URINALYSIS, ROUTINE W REFLEX MICROSCOPIC     Status: Abnormal   Collection Time   01/11/12 11:47 AM      Component Value Range Comment   Color, Urine RED (*) YELLOW BIOCHEMICALS MAY BE AFFECTED BY COLOR   APPearance TURBID (*) CLEAR    Specific Gravity, Urine 1.018  1.005 - 1.030    pH 6.0  5.0 - 8.0  Glucose, UA NEGATIVE  NEGATIVE mg/dL    Hgb urine dipstick LARGE (*) NEGATIVE    Bilirubin Urine LARGE (*) NEGATIVE    Ketones, ur 15 (*) NEGATIVE mg/dL    Protein, ur >578 (*) NEGATIVE mg/dL    Urobilinogen, UA 0.2  0.0 - 1.0 mg/dL    Nitrite POSITIVE (*) NEGATIVE    Leukocytes, UA LARGE (*) NEGATIVE   URINE MICROSCOPIC-ADD ON     Status: Abnormal   Collection Time   01/11/12 11:47 AM      Component Value Range Comment   Squamous Epithelial / LPF RARE  RARE    WBC, UA TOO  NUMEROUS TO COUNT  <3 WBC/hpf    RBC / HPF TOO NUMEROUS TO COUNT  <3 RBC/hpf    Bacteria, UA FEW (*) RARE    Urine-Other MICROSCOPIC EXAM PERFORMED ON UNCONCENTRATED URINE   FIELD OBSCURED BY RBC'S  CBC WITH DIFFERENTIAL     Status: Abnormal   Collection Time   01/11/12 11:59 AM      Component Value Range Comment   WBC 21.7 (*) 4.0 - 10.5 K/uL    RBC 2.90 (*) 4.22 - 5.81 MIL/uL    Hemoglobin 9.2 (*) 13.0 - 17.0 g/dL    HCT 46.9 (*) 62.9 - 52.0 %    MCV 93.8  78.0 - 100.0 fL    MCH 31.7  26.0 - 34.0 pg    MCHC 33.8  30.0 - 36.0 g/dL    RDW 52.8  41.3 - 24.4 %    Platelets 299  150 - 400 K/uL    Neutrophils Relative 94 (*) 43 - 77 %    Lymphocytes Relative 1 (*) 12 - 46 %    Monocytes Relative 5  3 - 12 %    Eosinophils Relative 0  0 - 5 %    Basophils Relative 0  0 - 1 %    Neutro Abs 20.4 (*) 1.7 - 7.7 K/uL    Lymphs Abs 0.2 (*) 0.7 - 4.0 K/uL    Monocytes Absolute 1.1 (*) 0.1 - 1.0 K/uL    Eosinophils Absolute 0.0  0.0 - 0.7 K/uL    Basophils Absolute 0.0  0.0 - 0.1 K/uL    WBC Morphology MILD LEFT SHIFT (1-5% METAS, OCC MYELO, OCC BANDS)     COMPREHENSIVE METABOLIC PANEL     Status: Abnormal   Collection Time   01/11/12 11:59 AM      Component Value Range Comment   Sodium 130 (*) 135 - 145 mEq/L    Potassium 4.8  3.5 - 5.1 mEq/L    Chloride 98  96 - 112 mEq/L    CO2 19  19 - 32 mEq/L    Glucose, Bld 134 (*) 70 - 99 mg/dL    BUN 69 (*) 6 - 23 mg/dL    Creatinine, Ser 0.10 (*) 0.50 - 1.35 mg/dL    Calcium 9.1  8.4 - 27.2 mg/dL    Total Protein 7.2  6.0 - 8.3 g/dL    Albumin 2.4 (*) 3.5 - 5.2 g/dL    AST 18  0 - 37 U/L    ALT 12  0 - 53 U/L    Alkaline Phosphatase 67  39 - 117 U/L    Total Bilirubin 0.2 (*) 0.3 - 1.2 mg/dL    GFR calc non Af Amer 12 (*) >90 mL/min    GFR calc Af Amer 14 (*) >90 mL/min   PROTIME-INR  Status: Abnormal   Collection Time   01/11/12 11:59 AM      Component Value Range Comment   Prothrombin Time 16.3 (*) 11.6 - 15.2 seconds    INR 1.29   0.00 - 1.49   APTT     Status: Normal   Collection Time   01/11/12 11:59 AM      Component Value Range Comment   aPTT 36  24 - 37 seconds   CARDIAC PANEL(CRET KIN+CKTOT+MB+TROPI)     Status: Normal   Collection Time   01/11/12 11:59 AM      Component Value Range Comment   Total CK 60  7 - 232 U/L    CK, MB 3.2  0.3 - 4.0 ng/mL    Troponin I <0.30  <0.30 ng/mL    Relative Index RELATIVE INDEX IS INVALID  0.0 - 2.5   MAGNESIUM     Status: Normal   Collection Time   01/11/12 11:59 AM      Component Value Range Comment   Magnesium 1.9  1.5 - 2.5 mg/dL   LACTIC ACID, PLASMA     Status: Abnormal   Collection Time   01/11/12  3:03 PM      Component Value Range Comment   Lactic Acid, Venous 2.7 (*) 0.5 - 2.2 mmol/L   PROCALCITONIN     Status: Normal   Collection Time   01/11/12  3:03 PM      Component Value Range Comment   Procalcitonin 33.30     MRSA PCR SCREENING     Status: Abnormal   Collection Time   01/11/12  4:37 PM      Component Value Range Comment   MRSA by PCR POSITIVE (*) NEGATIVE    Recent Results (from the past 240 hour(s))  MRSA PCR SCREENING     Status: Abnormal   Collection Time   01/11/12  4:37 PM      Component Value Range Status Comment   MRSA by PCR POSITIVE (*) NEGATIVE Final    Creatinine:  Basename 01/11/12 1159  CREATININE 3.95*      Impression/Assessment:  Gross hematuria. BPH. Renal insufficiency.    Plan:  Leave Foley catheter indwelling. Irrigate catheter as needed. Renal ultrasound to rule out hydronephrosis. Patient will need cystoscopy when medically stable.  Jann Milkovich-HENRY 01/11/2012, 6:20 PM

## 2012-01-11 NOTE — ED Notes (Signed)
Per EMS- Patient is a resident of [facility]. Patient has had hematuria and weakness x 2 days according to staff. Patient was seen on 6/26/21013 in the ED for the same. Patient has a history of BPH per EMS.

## 2012-01-11 NOTE — Consult Note (Signed)
Reason for Consult: Abnormal EKG  Referring Physician: Dr. [name] is an 78 y.o. male.    Chief Complaint:  Hematuria   HPI: Jack Stevens is an 78 year old Caucasian gentleman with a history of chronic kidney disease stage III last creatinine of 1.29 on 01/06/2012 per discharge summary from [facility], history of BPH with chronic indwelling Foley catheter, history of MGUS, history of hypertension history of seizure history of depression history of anxiety history of PTSD history of anemia who was recently discharged from [facility] with acute on chronic kidney disease secondary to severe BPH and seizure disorder presenting to the ED with a one to two-day history of worsening hematuria. ED records and records from the nursing home. Patient is a poor historian and as such can't quite give a full history. When asked the patient why he is here patient states that he just feels very weak and feels his nerves are shot and not too sure. Patient thinks he lives at home in his own home however is from a facility since to be asked in place. There is no family at bedside and a such most of the history was obtained from the ED records. The ED records the patient presented from [facility] place with generalized weakness and hematuria which have been ongoing for the past 2 days. Patient denies any fever, no chills, no chest pain, no nausea, no vomiting, no abdominal pain, no change in his chronic shortness of breath per patient, no dysuria no diarrhea no constipation no cough no other associated symptoms. Patient was seen in the ED labs which were drawn did show white count of 21.7 a hemoglobin of 9.2 sodium of 1:30 BUN of 69 creatinine of 3.95 urinalysis was consistent with a UTI chest x-ray was negative. The patient was admitted for further evaluation and management.  Cardiology asked to see for abnormal EKG. T wave inversions in lateral leads and ? St elevation in ant   Leads.  Initial cardiac markers negative.   Currently no chest pain, and he denied chest pain though he did have history of chest pain when he was in the [employer] and had a stress test.  But no knowledge of heart disease or cardiac cath.  HR now 120 S. Tach with occ. Pause.  He appears septic.  In discharge note from [facility] there is no mention of CAD or EKGs.   Past Medical History  Diagnosis Date  . Renal disorder   . Chronic renal failure   . BPH (benign prostatic hyperplasia)   . Seizure   . Hypertension   . Depression   . MGUS (monoclonal gammopathy of unknown significance)   . Anxiety   . PTSD (post-traumatic stress disorder)   . Diverticulitis   . Anemia   . Pneumonia     Past Surgical History  Procedure Date  . Tonsillectomy     History reviewed. No pertinent family history. He stated no family history of CAD. Social History:  reports that he has been smoking.  He does not have any smokeless tobacco history on file. He reports that he does not drink alcohol or use illicit drugs. Widowed, 2 children in [place].   Currently living at [facility].  Allergies: No Known Allergies  Medications Prior to Admission  Medication Sig Dispense Refill  . acetaminophen (TYLENOL) 325 MG tablet Take by mouth every 6 (six) hours as needed. 1 to 2 tablets every 4 hours as needed for pain  or temperature exceeding 100.79F. Not to exceed 4gm APAP/24 hrs.      . ALPRAZolam (XANAX) 0.5 MG tablet Take 0.5 mg by mouth 3 (three) times daily as needed. Anxiety.      Marland Kitchen amLODipine (NORVASC) 5 MG tablet Take 5 mg by mouth daily.      Marland Kitchen dutasteride (AVODART) 0.5 MG capsule Take 0.5 mg by mouth daily.      . ferrous sulfate 325 (65 FE) MG tablet Take 325 mg by mouth daily with breakfast.      . hydrocerin (EUCERIN) CREA Apply 1 application topically 2 (two) times daily as needed. Apply to affected area on back every 12 hours as needed for rash.      Marland Kitchen HYDROcodone-acetaminophen (NORCO)  5-325 MG per tablet Take by mouth every 4 (four) hours as needed. Take 1 to 2 tablets every 4 hours as needed; not to exceed 4gm APAP/24 hrs.      . metoprolol tartrate (LOPRESSOR) 25 MG tablet Take 25 mg by mouth 2 (two) times daily.      . Multiple Vitamin (MULTIVITAMIN WITH MINERALS) TABS Take 1 tablet by mouth daily.      . nicotine (NICODERM CQ - DOSED IN MG/24 HR) 7 mg/24hr patch Place 1 patch onto the skin daily.      Marland Kitchen nystatin (MYCOSTATIN) powder Apply 1 Units topically 2 (two) times daily. To affected area (unspecified) for fungal infection.      . ondansetron (ZOFRAN-ODT) 4 MG disintegrating tablet Take 4 mg by mouth every 6 (six) hours as needed. Nausea/vomiting.      . pantoprazole (PROTONIX) 40 MG tablet Take 40 mg by mouth every morning.      . sertraline (ZOLOFT) 100 MG tablet Take 150 mg by mouth daily.      . sodium chloride 0.9 % infusion Inject 2 mLs into the vein every 6 (six) hours. For IV push.      . Tamsulosin HCl (FLOMAX) 0.4 MG CAPS Take 0.4 mg by mouth daily. After a meal.      . triamcinolone cream (KENALOG) 0.1 % Apply 1 application topically 2 (two) times daily. Apply to rash on back until resolved.        Results for orders placed during the hospital encounter of 01/11/12 (from the past 48 hour(s))  URINALYSIS, ROUTINE W REFLEX MICROSCOPIC     Status: Abnormal   Collection Time   01/11/12 11:47 AM      Component Value Range Comment   Color, Urine RED (*) YELLOW BIOCHEMICALS MAY BE AFFECTED BY COLOR   APPearance TURBID (*) CLEAR    Specific Gravity, Urine 1.018  1.005 - 1.030    pH 6.0  5.0 - 8.0    Glucose, UA NEGATIVE  NEGATIVE mg/dL    Hgb urine dipstick LARGE (*) NEGATIVE    Bilirubin Urine LARGE (*) NEGATIVE    Ketones, ur 15 (*) NEGATIVE mg/dL    Protein, ur >098 (*) NEGATIVE mg/dL    Urobilinogen, UA 0.2  0.0 - 1.0 mg/dL    Nitrite POSITIVE (*) NEGATIVE    Leukocytes, UA LARGE (*) NEGATIVE   URINE MICROSCOPIC-ADD ON     Status: Abnormal   Collection  Time   01/11/12 11:47 AM      Component Value Range Comment   Squamous Epithelial / LPF RARE  RARE    WBC, UA TOO NUMEROUS TO COUNT  <3 WBC/hpf    RBC / HPF TOO NUMEROUS TO COUNT  <3 RBC/hpf  Bacteria, UA FEW (*) RARE    Urine-Other MICROSCOPIC EXAM PERFORMED ON UNCONCENTRATED URINE   FIELD OBSCURED BY RBC'S  CBC WITH DIFFERENTIAL     Status: Abnormal   Collection Time   01/11/12 11:59 AM      Component Value Range Comment   WBC 21.7 (*) 4.0 - 10.5 K/uL    RBC 2.90 (*) 4.22 - 5.81 MIL/uL    Hemoglobin 9.2 (*) 13.0 - 17.0 g/dL    HCT 60.4 (*) 54.0 - 52.0 %    MCV 93.8  78.0 - 100.0 fL    MCH 31.7  26.0 - 34.0 pg    MCHC 33.8  30.0 - 36.0 g/dL    RDW 98.1  19.1 - 47.8 %    Platelets 299  150 - 400 K/uL    Neutrophils Relative 94 (*) 43 - 77 %    Lymphocytes Relative 1 (*) 12 - 46 %    Monocytes Relative 5  3 - 12 %    Eosinophils Relative 0  0 - 5 %    Basophils Relative 0  0 - 1 %    Neutro Abs 20.4 (*) 1.7 - 7.7 K/uL    Lymphs Abs 0.2 (*) 0.7 - 4.0 K/uL    Monocytes Absolute 1.1 (*) 0.1 - 1.0 K/uL    Eosinophils Absolute 0.0  0.0 - 0.7 K/uL    Basophils Absolute 0.0  0.0 - 0.1 K/uL    WBC Morphology MILD LEFT SHIFT (1-5% METAS, OCC MYELO, OCC BANDS)     COMPREHENSIVE METABOLIC PANEL     Status: Abnormal   Collection Time   01/11/12 11:59 AM      Component Value Range Comment   Sodium 130 (*) 135 - 145 mEq/L    Potassium 4.8  3.5 - 5.1 mEq/L    Chloride 98  96 - 112 mEq/L    CO2 19  19 - 32 mEq/L    Glucose, Bld 134 (*) 70 - 99 mg/dL    BUN 69 (*) 6 - 23 mg/dL    Creatinine, Ser 2.95 (*) 0.50 - 1.35 mg/dL    Calcium 9.1  8.4 - 62.1 mg/dL    Total Protein 7.2  6.0 - 8.3 g/dL    Albumin 2.4 (*) 3.5 - 5.2 g/dL    AST 18  0 - 37 U/L    ALT 12  0 - 53 U/L    Alkaline Phosphatase 67  39 - 117 U/L    Total Bilirubin 0.2 (*) 0.3 - 1.2 mg/dL    GFR calc non Af Amer 12 (*) >90 mL/min    GFR calc Af Amer 14 (*) >90 mL/min   PROTIME-INR     Status: Abnormal   Collection Time    01/11/12 11:59 AM      Component Value Range Comment   Prothrombin Time 16.3 (*) 11.6 - 15.2 seconds    INR 1.29  0.00 - 1.49   APTT     Status: Normal   Collection Time   01/11/12 11:59 AM      Component Value Range Comment   aPTT 36  24 - 37 seconds   CARDIAC PANEL(CRET KIN+CKTOT+MB+TROPI)     Status: Normal   Collection Time   01/11/12 11:59 AM      Component Value Range Comment   Total CK 60  7 - 232 U/L    CK, MB 3.2  0.3 - 4.0 ng/mL    Troponin I <0.30  <  0.30 ng/mL    Relative Index RELATIVE INDEX IS INVALID  0.0 - 2.5   LACTIC ACID, PLASMA     Status: Abnormal   Collection Time   01/11/12  3:03 PM      Component Value Range Comment   Lactic Acid, Venous 2.7 (*) 0.5 - 2.2 mmol/L   PROCALCITONIN     Status: Normal   Collection Time   01/11/12  3:03 PM      Component Value Range Comment   Procalcitonin 33.30      Dg Chest Port 1 View  01/11/2012  *RADIOLOGY REPORT*  Clinical Data: Weakness.  Shortness of breath.  PORTABLE CHEST - 1 VIEW  Comparison: None.  Findings: Enlarged cardiac silhouette.  Moderately large hiatal hernia.  Clear lungs with normal vascularity.  Bilateral acromioclavicular joint degenerative changes.  IMPRESSION:  1.  Cardiomegaly. 2.  Moderately large hiatal hernia.  Original Report Authenticated By: Darrol Angel, M.D.    ROS: General:chilled, now , no chest pain, obviously ill, but pleasant affect. Skin:no rashes or ulcers HEENT:no blurred vision CV:no chest pain PUL:no SOB GI:no diarrhea, no constipation  GU:+++hematuria ZO:XWRU back pain Neuro:oriented now, but weak Endo:no diabetes. No thyroid   Blood pressure 133/71, pulse 114, temperature 98.1 F (36.7 C), temperature source Oral, resp. rate 27, SpO2 96.00%. PE: General:Alert and oriented, no acute distress but appears ill.. Color poor Skin:Warm and dry to moist HEENT:normocephalic sclera clear Neck:supple no JVD. Heart:S1S2 RRR.  Lungs:clear ant. No rales Abd:+BS, soft abd.  Ext:+ 1 edema of  ankles Neuro:alert and oriented, poor historian.    Assessment/Plan Patient Active Problem List  Diagnosis  . Sepsis  . Acute on chronic kidney disease, stage 3  . UTI (lower urinary tract infection)  . Dehydration  . EKG abnormalities  . Hyponatremia  . Weakness generalized  . Leukocytosis  . BPH (benign prostatic hyperplasia)  . Anemia  . Acute blood loss anemia   PLAN: recent hydronephrosis agree with renal ultrasound.  Abnormal EKG could be LVH,  Neg. Enzymes.  Will check EKG in am and echo.  Serial cardiac enzymes.  Would eventually need nuc. Study.   INGOLD,LAURA R 01/11/2012, 4:45 PM  Patient seen and examined. Agree with assessment and plan. Pleasant [name] with  History of htn, 65 yr tobacco history, anemia and has h/o recent obstructive uropathy felt secondary to marked BPH. He had been  hospitilized at [facility] in from 6/26 - 01/06/2012 with Cr 6.67 with bilateral hydronephrosis(L>R) which improved to 1.29 at dc.  He now presents with SOB, fever, probable sepsis with WBC 21K with left shift probably from urine source. Pt is tachycardic at 110. ECG shows T inversion anterolaterally, Cr now 3.95. No chest pain. Initial cardiac enzymes are negative. CXR suggests cardiomegaly with large hiatal hernia.  Will schedule for 2d echo, and suggest reevaluation for obstructive uropathy with ultrasound. Will need ischemic evaluation once stable with initial myoview scan for risk stratification in light of ECG changes, risk factors and renal insufficiency.   Lennette Bihari, MD, [facility] 01/11/2012 5:46 PM

## 2012-01-11 NOTE — ED Provider Notes (Signed)
History     CSN: [id number]  Arrival date & time 01/11/12  1031   First MD Initiated Contact with Patient 01/11/12 1059      Chief Complaint  Patient presents with  . Hematuria  . Weakness    (Consider location/radiation/quality/duration/timing/severity/associated sxs/prior treatment) HPI Pt is poor historian. Coming [facility] by [facility] for generalized weakness and hematuria. Per facility symptoms have been for 2 days. Pt unable to recall when it started. Denied focal pain, fever, chills. No N/V/D.  Past Medical History  Diagnosis Date  . Renal disorder   . Chronic renal failure   . BPH (benign prostatic hyperplasia)   . Seizure   . Hypertension   . Depression   . MGUS (monoclonal gammopathy of unknown significance)   . Anxiety   . PTSD (post-traumatic stress disorder)   . Diverticulitis   . Anemia   . Pneumonia     Past Surgical History  Procedure Date  . Tonsillectomy     No family history on file.  History  Substance Use Topics  . Smoking status: Current Some Day Smoker  . Smokeless tobacco: Not on file  . Alcohol Use: No      Review of Systems  Constitutional: Positive for fatigue. Negative for fever and chills.  Respiratory: Negative for shortness of breath.   Cardiovascular: Negative for chest pain.  Gastrointestinal: Negative for nausea, vomiting and abdominal pain.  Genitourinary: Positive for hematuria. Negative for dysuria.  Skin: Negative for rash.  Neurological: Positive for weakness. Negative for dizziness, light-headedness, numbness and headaches.    Allergies  Review of patient's allergies indicates no known allergies.  Home Medications  No current outpatient prescriptions on file.  BP 100/52  Pulse 77  Temp 98.1 F (36.7 C) (Oral)  Resp 18  SpO2 98%  Physical Exam  Nursing note and vitals reviewed. Constitutional: He is oriented to person, place, and time. He appears well-developed. No distress.  HENT:  Head:  Normocephalic and atraumatic.       Dry MM and tongue  Eyes: EOM are normal. Pupils are equal, round, and reactive to light.  Neck: Normal range of motion. Neck supple.  Cardiovascular: Normal rate and regular rhythm.   Pulmonary/Chest: Effort normal and breath sounds normal. No respiratory distress. He has no wheezes. He has no rales.  Abdominal: Soft. Bowel sounds are normal. He exhibits distension. He exhibits no mass. There is no tenderness. There is no rebound and no guarding.  Genitourinary:       Foley draining red urine  Musculoskeletal: Normal range of motion. He exhibits no edema and no tenderness.  Neurological: He is oriented to person, place, and time.       Pt appears fatigued and dry.  Moves all ext without focal weakness, sensation intact  Skin: Skin is warm and dry. No rash noted. No erythema.    ED Course  Procedures (including critical care time)  Labs Reviewed  CBC WITH DIFFERENTIAL - Abnormal; Notable for the following:    WBC 21.7 (*)     RBC 2.90 (*)     Hemoglobin 9.2 (*)     HCT 27.2 (*)     Neutrophils Relative 94 (*)     Lymphocytes Relative 1 (*)     Neutro Abs 20.4 (*)     Lymphs Abs 0.2 (*)     Monocytes Absolute 1.1 (*)     All other components within normal limits  COMPREHENSIVE METABOLIC PANEL - Abnormal; Notable  for the following:    Sodium 130 (*)     Glucose, Bld 134 (*)     BUN 69 (*)     Creatinine, Ser 3.95 (*)     Albumin 2.4 (*)     Total Bilirubin 0.2 (*)     GFR calc non Af Amer 12 (*)     GFR calc Af Amer 14 (*)     All other components within normal limits  URINALYSIS, ROUTINE W REFLEX MICROSCOPIC - Abnormal; Notable for the following:    Color, Urine RED (*)  BIOCHEMICALS MAY BE AFFECTED BY COLOR   APPearance TURBID (*)     Hgb urine dipstick LARGE (*)     Bilirubin Urine LARGE (*)     Ketones, ur 15 (*)     Protein, ur >300 (*)     Nitrite POSITIVE (*)     Leukocytes, UA LARGE (*)     All other components within normal  limits  PROTIME-INR - Abnormal; Notable for the following:    Prothrombin Time 16.3 (*)     All other components within normal limits  URINE MICROSCOPIC-ADD ON - Abnormal; Notable for the following:    Bacteria, UA FEW (*)     All other components within normal limits  APTT  CARDIAC PANEL(CRET KIN+CKTOT+MB+TROPI)   Dg Chest Port 1 View  01/11/2012  *RADIOLOGY REPORT*  Clinical Data: Weakness.  Shortness of breath.  PORTABLE CHEST - 1 VIEW  Comparison: None.  Findings: Enlarged cardiac silhouette.  Moderately large hiatal hernia.  Clear lungs with normal vascularity.  Bilateral acromioclavicular joint degenerative changes.  IMPRESSION:  1.  Cardiomegaly. 2.  Moderately large hiatal hernia.  Original Report Authenticated By: Darrol Angel, M.D.     1. Dehydration   2. UTI (lower urinary tract infection)   3. Renal insufficiency      Date: 01/11/2012  Rate: 79  Rhythm: normal sinus rhythm  QRS Axis: normal  Intervals: normal  ST/T Wave abnormalities: nonspecific T wave changes  Conduction Disutrbances:none  Narrative Interpretation:   Old EKG Reviewed: none available   MDM   [facility] to admit.        Loren Racer, MD 01/11/12 1356

## 2012-01-11 NOTE — ED Notes (Signed)
ZOX:[id number]<AV> Expected date:01/11/12<BR> Expected time:10:27 AM<BR> Means of arrival:<BR> Comments:<BR> Blood in foley

## 2012-01-11 NOTE — Plan of Care (Signed)
Problem: Phase I Progression Outcomes Goal: Pain controlled with appropriate interventions Outcome: Progressing Denies pain.     

## 2012-01-11 NOTE — ED Notes (Signed)
Emergency Contact Person-Jennifer Condrey/daughter  [phone]

## 2012-01-11 NOTE — H&P (Addendum)
Jack Stevens MRN: [id number] DOB/AGE: 10-20-1921 78 y.o. Primary Care Physician:No primary provider on file. Admit date: 01/11/2012 Chief Complaint: Hematuria HPI: Jack Stevens is an 78 year old Caucasian gentleman with a history of chronic kidney disease stage III last creatinine of 1.29 on 01/06/2012 per discharge summary from [facility], history of BPH with chronic indwelling Foley catheter, history of MGUS, history of hypertension history of seizure history of depression history of anxiety history of PTSD history of anemia who was recently discharged from [facility] with acute on chronic kidney disease secondary to severe BPH and seizure disorder presenting to the ED with a one to two-day history of worsening hematuria. ED records and records from the nursing home. Patient is a poor historian and as such can't quite give a full history. When asked the patient why he is here patient states that he just feels very weak and feels his nerves are shot and not too sure. Patient thinks he lives at home in his own home however is from a facility since to be asked in place. There is no family at bedside and a such most of the history was obtained from the ED records. The ED records the patient presented from [facility] with generalized weakness and hematuria which have been ongoing for the past 2 days. Patient denies any fever, no chills, no chest pain, no nausea, no vomiting, no abdominal pain, no change in his chronic shortness of breath per patient, no dysuria no diarrhea no constipation no cough no other associated symptoms. Patient was seen in the ED labs which were drawn did show white count of 21.7 a hemoglobin of 9.2 sodium of 1:30 BUN of 69 creatinine of 3.95 urinalysis was consistent with a UTI chest x-ray was negative. Will call to admit the patient for further evaluation and management.  Past Medical History  Diagnosis Date  . Renal disorder   . Chronic renal  failure   . BPH (benign prostatic hyperplasia)   . Seizure   . Hypertension   . Depression   . MGUS (monoclonal gammopathy of unknown significance)   . Anxiety   . PTSD (post-traumatic stress disorder)   . Diverticulitis   . Anemia   . Pneumonia     Past Surgical History  Procedure Date  . Tonsillectomy     Prior to Admission medications   Not on File    Allergies: No Known Allergies  History reviewed. No pertinent family history.  Social History:  reports that he has been smoking.  He does not have any smokeless tobacco history on file. He reports that he does not drink alcohol or use illicit drugs.  ROS: All systems reviewed with the patient and was positive as per HPI otherwise all other systems are negative.  PHYSICAL EXAM: Blood pressure 98/53, pulse 107, temperature 98.1 F (36.7 C), temperature source Oral, resp. rate 29, SpO2 95.00%. General: Alert, awake, some confusion in no acute cardiopulmonary distress. Following commands. HEENT: Normocephalic atraumatic. Pupils equal round and reactive to light and accommodation. Extraocular movements intact. Oropharynx is clear, no lesions, no exudates. Neck is supple with no lymphadenopathy. Extremely dry mucous membranes. No bruits, no goiter. Heart: Tachycardia, without murmurs, rubs, gallops. Lungs: Clear to auscultation bilaterally. Abdomen: Soft, nontender, nondistended, positive bowel sounds. Extremities: No clubbing cyanosis or edema with positive pedal pulses. Neuro: Alert. Cranial nerves II through XII are closely intact. No focal deficits. Sensation is intact. 5 out of 5 bilateral upper extremity strength. 5 out of 5  bilateral lower extremity strength. Grossly intact, nonfocal. GU: Foley catheter in place with numerous bloody clots in the Foley bag.    EKG: T-wave inversions in leads 1,2, V4 through V6. No old EKG to compare to.  No results found for this or any previous visit (from the past 240 hour(s)).    Lab results:  [facility] 01/11/12 1159  NA 130*  K 4.8  CL 98  CO2 19  GLUCOSE 134*  BUN 69*  CREATININE 3.95*  CALCIUM 9.1  MG --  PHOS --    Basename 01/11/12 1159  AST 18  ALT 12  ALKPHOS 67  BILITOT 0.2*  PROT 7.2  ALBUMIN 2.4*   No results found for this basename: LIPASE:2,AMYLASE:2 in the last 72 hours  Basename 01/11/12 1159  WBC 21.7*  NEUTROABS 20.4*  HGB 9.2*  HCT 27.2*  MCV 93.8  PLT 299    Basename 01/11/12 1159  CKTOTAL 60  CKMB 3.2  CKMBINDEX --  TROPONINI <0.30   No components found with this basename: POCBNP:3 No results found for this basename: DDIMER in the last 72 hours No results found for this basename: HGBA1C:2 in the last 72 hours No results found for this basename: CHOL:2,HDL:2,LDLCALC:2,TRIG:2,CHOLHDL:2,LDLDIRECT:2 in the last 72 hours No results found for this basename: TSH,T4TOTAL,FREET3,T3FREE,THYROIDAB in the last 72 hours No results found for this basename: VITAMINB12:2,FOLATE:2,FERRITIN:2,TIBC:2,IRON:2,RETICCTPCT:2 in the last 72 hours Imaging results:  Dg Chest Port 1 View  01/11/2012  *RADIOLOGY REPORT*  Clinical Data: Weakness.  Shortness of breath.  PORTABLE CHEST - 1 VIEW  Comparison: None.  Findings: Enlarged cardiac silhouette.  Moderately large hiatal hernia.  Clear lungs with normal vascularity.  Bilateral acromioclavicular joint degenerative changes.  IMPRESSION:  1.  Cardiomegaly. 2.  Moderately large hiatal hernia.  Original Report Authenticated By: Darrol Angel, M.D.   Impression/Plan:  Principal Problem:  *Sepsis Active Problems:  Acute on chronic kidney disease, stage 3  UTI (lower urinary tract infection)  Dehydration  EKG abnormalities  Hyponatremia  Weakness generalized  Leukocytosis  BPH (benign prostatic hyperplasia)  Anemia  Acute blood loss anemia   #1 sepsis Likely secondary to urinary tract infection. Patient is tachycardic, has a respiratory rate of 29, with a white count of 21.7 and  urinalysis consistent with a UTI. Patient's blood pressure is borderline. We'll admit patient to the step down unit. Hydrate with IV fluids. Will check blood cultures x2. Check a lactic acid level. Check a pro calcitonin level. Chest x-ray is negative. Hydrate with IV fluids. Placed empirically on IV Zosyn.  #2 acute on chronic kidney disease stage III Likely secondary to a combination of a post renal azotemia likely secondary to obstruction from BPH in the setting of dehydration. Per records from nursing home on discharge from [facility] patient's discharge creatinine was 1.29 on 01/06/2012.Marland Kitchen Patient's creatinine on admission is 3.95. Will check a UA with cultures and sensitivities. Check a urine sodium and a urine creatinine. Check a renal ultrasound. Will change Foley catheter in place a 24 inch Foley with three-way. Hydrate with IV fluids. Follow. Will consult with urology for further evaluation and management. If no improvement in renal function her renal function continues to worsen may consider a renal consult.  #3 hematuria  Questionable etiology. Patient does have a history of enlarged BPH. Will change Foley catheter. Will consult with urology for further evaluation and management.  #4 urinary tract infection Check a urine cultures. Placed empirically on IV Zosyn.  #5 dehydration IV fluids  #  6 EKG changes We'll cycle cardiac enzymes every 8 hours x3. Check a 2-D echo. Will consult cardiology for further evaluation and management. Unable to place patient on aspirin secondary to problem #3.  #7 leukocytosis Likely secondary to UTI. Will check blood cultures x2. Chest x-ray is negative. Will check a lactic acid level and a pro calcitonin level. Placed empirically on IV Zosyn.  #8 anemia/acute blood loss anemia Likely secondary to problem #3. Will check an anemia panel. Follow H&H.  #9 hyponatremia Likely secondary to volume depletion. Will check a TSH. Check a urine  sodium and a urine creatinine. Chest x-ray is negative. Place on IV fluids and follow.  #10 depression/anxiety Continue home regimen of Zoloft and Xanax.  #11 hypertension Will hold patient's metoprolol and Norvasc secondary to borderline blood pressure in problem #1. Will follow.  #12 BPH Continue home regimen of Flomax and Avodart.  #13 prophylaxis Protonix for GI prophylaxis. SCDs for DVT prophylaxis.  Shawntez Dickison [phone] 01/11/2012, 3:10 PM  Critical care time spent 60 minutes.

## 2012-01-11 NOTE — Progress Notes (Signed)
CRITICAL VALUE ALERT  Critical value received:  + MRSA  Date of notification:  01/11/2012  Time of notification:  1800  Critical value read back:yes   Nurse who received alert:  Aris Georgia, RN  MD notified (1st page):  Janee Morn, D  Time of first page:  1805  MD notified (2nd page):  Time of second page:  Responding MD:  Isla Pence  Time MD responded:  [phone]

## 2012-01-12 ENCOUNTER — Inpatient Hospital Stay (HOSPITAL_COMMUNITY): Payer: Medicare Other

## 2012-01-12 ENCOUNTER — Other Ambulatory Visit: Payer: Self-pay

## 2012-01-12 DIAGNOSIS — N400 Enlarged prostate without lower urinary tract symptoms: Secondary | ICD-10-CM

## 2012-01-12 DIAGNOSIS — R9431 Abnormal electrocardiogram [ECG] [EKG]: Secondary | ICD-10-CM

## 2012-01-12 DIAGNOSIS — J984 Other disorders of lung: Secondary | ICD-10-CM

## 2012-01-12 DIAGNOSIS — N179 Acute kidney failure, unspecified: Secondary | ICD-10-CM

## 2012-01-12 DIAGNOSIS — R319 Hematuria, unspecified: Secondary | ICD-10-CM | POA: Diagnosis present

## 2012-01-12 DIAGNOSIS — R0603 Acute respiratory distress: Secondary | ICD-10-CM | POA: Diagnosis present

## 2012-01-12 DIAGNOSIS — I10 Essential (primary) hypertension: Secondary | ICD-10-CM | POA: Diagnosis present

## 2012-01-12 DIAGNOSIS — A419 Sepsis, unspecified organism: Principal | ICD-10-CM

## 2012-01-12 LAB — COMPREHENSIVE METABOLIC PANEL
CO2: 18 mEq/L — ABNORMAL LOW (ref 19–32)
Calcium: 8.7 mg/dL (ref 8.4–10.5)
Chloride: 99 mEq/L (ref 96–112)
Creatinine, Ser: 3.14 mg/dL — ABNORMAL HIGH (ref 0.50–1.35)
GFR calc Af Amer: 19 mL/min — ABNORMAL LOW (ref >90)
GFR calc non Af Amer: 16 mL/min — ABNORMAL LOW (ref >90)
Glucose, Bld: 129 mg/dL — ABNORMAL HIGH (ref 70–99)
Total Bilirubin: 0.2 mg/dL — ABNORMAL LOW (ref 0.3–1.2)

## 2012-01-12 LAB — DIFFERENTIAL
Basophils Relative: 0 % (ref 0–1)
Eosinophils Relative: 0 % (ref 0–5)
Lymphs Abs: 0.3 10*3/uL — ABNORMAL LOW (ref 0.7–4.0)
Monocytes Absolute: 0.5 10*3/uL (ref 0.1–1.0)
Monocytes Relative: 3 % (ref 3–12)
Neutro Abs: 16.3 10*3/uL — ABNORMAL HIGH (ref 1.7–7.7)

## 2012-01-12 LAB — CBC
HCT: 24.2 % — ABNORMAL LOW (ref 39.0–52.0)
Hemoglobin: 8.3 g/dL — ABNORMAL LOW (ref 13.0–17.0)
MCH: 31.4 pg (ref 26.0–34.0)
MCV: 91.7 fL (ref 78.0–100.0)
RBC: 2.64 MIL/uL — ABNORMAL LOW (ref 4.22–5.81)
WBC: 17.1 10*3/uL — ABNORMAL HIGH (ref 4.0–10.5)

## 2012-01-12 LAB — CARDIAC PANEL(CRET KIN+CKTOT+MB+TROPI)
CK, MB: 2 ng/mL (ref 0.3–4.0)
Total CK: 34 U/L (ref 7–232)
Troponin I: <0.3 ng/mL (ref <0.30)

## 2012-01-12 LAB — PROCALCITONIN: Procalcitonin: 22.14 ng/mL

## 2012-01-12 MED ORDER — VANCOMYCIN HCL IN DEXTROSE 1-5 GM/200ML-% IV SOLN
1000.0000 mg | INTRAVENOUS | Status: DC
  Administered 2012-01-12 – 2012-01-14 (×2): 1000 mg via INTRAVENOUS
  Filled 2012-01-12 (×2): qty 200

## 2012-01-12 MED ORDER — NICOTINE 14 MG/24HR TD PT24
14.0000 mg | MEDICATED_PATCH | Freq: Every day | TRANSDERMAL | Status: DC
  Filled 2012-01-12: qty 1

## 2012-01-12 MED ORDER — METOPROLOL TARTRATE 25 MG PO TABS
25.0000 mg | ORAL_TABLET | Freq: Two times a day (BID) | ORAL | Status: DC
  Administered 2012-01-12 – 2012-01-15 (×6): 25 mg via ORAL
  Filled 2012-01-12 (×8): qty 1

## 2012-01-12 MED ORDER — ASPIRIN 81 MG PO CHEW
81.0000 mg | CHEWABLE_TABLET | Freq: Every day | ORAL | Status: DC
  Administered 2012-01-12 – 2012-01-19 (×8): 81 mg via ORAL
  Filled 2012-01-12 (×8): qty 1

## 2012-01-12 MED ORDER — DILTIAZEM HCL ER COATED BEADS 120 MG PO CP24
120.0000 mg | ORAL_CAPSULE | Freq: Every day | ORAL | Status: DC
  Administered 2012-01-12 – 2012-01-19 (×8): 120 mg via ORAL
  Filled 2012-01-12 (×8): qty 1

## 2012-01-12 MED ORDER — PIPERACILLIN-TAZOBACTAM IN DEX 2-0.25 GM/50ML IV SOLN
2.2500 g | Freq: Four times a day (QID) | INTRAVENOUS | Status: DC
  Administered 2012-01-12 – 2012-01-13 (×4): 2.25 g via INTRAVENOUS
  Filled 2012-01-12 (×5): qty 50

## 2012-01-12 NOTE — Progress Notes (Signed)
Subjective: Patient reports: No pain  Objective: Vital signs in last 24 hours: Temp:  [99 F (37.2 C)-101.1 F (38.4 C)] 99 F (37.2 C) (07/08 1600) Pulse Rate:  [80-122] 80  (07/08 1800) Resp:  [18-30] 18  (07/08 1800) BP: (94-147)/(37-74) 105/44 mmHg (07/08 1800) SpO2:  [92 %-99 %] 97 % (07/08 1800) Weight:  [172 lb 2.9 oz (78.1 kg)] 172 lb 2.9 oz (78.1 kg) (07/08 0457)  Intake/Output from previous day: 07/07 0701 - 07/08 0700 In: 1789.2 [P.O.:360; I.V.:1316.7; IV Piggyback:112.5] Out: 1700 [Urine:1700] Intake/Output this shift: Total I/O In: 1635 [I.V.:1375; IV Piggyback:260] Out: 1300 [Urine:1300]  Physical Exam:   Lungs - Normal respiratory effort, chest expands symmetrically.  Abdomen - Soft, non-tender & non-distended Urine clearing up.  Foley in place draining well.  Urinary output: 1300 ml. Creatinine : 3.14 from 3.95 yesterday. Lab Results:  Basename 01/12/12 0339 01/11/12 1159  HGB 8.3* 9.2*  HCT 24.2* 27.2*   BMET  Basename 01/12/12 0339 01/11/12 1159  NA 129* 130*  K 3.7 4.8  CL 99 98  CO2 18* 19  GLUCOSE 129* 134*  BUN 66* 69*  CREATININE 3.14* 3.95*  CALCIUM 8.7 9.1    Basename 01/11/12 1159  LABPT --  INR 1.29   No results found for this basename: LABURIN:1 in the last 72 hours Results for orders placed during the hospital encounter of 01/11/12  URINE CULTURE     Status: Normal (Preliminary result)   Collection Time   01/11/12 11:47 AM      Component Value Range Status Comment   Specimen Description URINE, CATHETERIZED   Final    Special Requests NONE   Final    Culture  Setup Time 01/11/2012 16:43   Final    Colony Count >=100,000 COLONIES/ML   Final    Culture     Final    Value: STAPHYLOCOCCUS AUREUS     Note: RIFAMPIN AND GENTAMICIN SHOULD NOT BE USED AS SINGLE DRUGS FOR TREATMENT OF STAPH INFECTIONS.   Report Status PENDING   Incomplete   CULTURE, BLOOD (ROUTINE X 2)     Status: Normal (Preliminary result)   Collection Time   01/11/12  3:03 PM      Component Value Range Status Comment   Specimen Description BLOOD RIGHT ARM  5 ML IN [facility] BOTTLE   Final    Special Requests NONE   Final    Culture  Setup Time 01/11/2012 22:25   Final    Culture     Final    Value: GRAM POSITIVE COCCI IN CLUSTERS     Note: Gram Stain Report Called to,Read Back By and Verified With: SHEILA MAIN @ 1746 ON 01/12/12 BY GOLLD   Report Status PENDING   Incomplete   CULTURE, BLOOD (ROUTINE X 2)     Status: Normal (Preliminary result)   Collection Time   01/11/12  3:36 PM      Component Value Range Status Comment   Specimen Description BLOOD RIGHT HAND  3 ML IN [facility] BOTTLE   Final    Special Requests NONE   Final    Culture  Setup Time 01/11/2012 22:25   Final    Culture     Final    Value: GRAM POSITIVE COCCI IN CLUSTERS     Note: Gram Stain Report Called to,Read Back By and Verified With: CONRAD WHITROW 01/12/12 1050 BY SMITHERSJ   Report Status PENDING   Incomplete   MRSA PCR SCREENING  Status: Abnormal   Collection Time   01/11/12  4:37 PM      Component Value Range Status Comment   MRSA by PCR POSITIVE (*) NEGATIVE Final     Studies/Results: US Renal  01/12/2012  *RADIOLOGY REPORT*  Clinical Data: 78 year old male with hematuria, UTI, acute renal failure.  RENAL/URINARY TRACT ULTRASOUND COMPLETE  Comparison:  None.  Findings:  Right Kidney:  Hydronephrosis.  Evidence of echogenic debris in the right renal pelvis and proximal ureter.  Hydroureter is evident. Renal length 11.5 cm.  No focal right renal lesion is identified.  Left Kidney:  Hydronephrosis, moderate to severe in greater than that on the right.  No debris evident in the left renal collecting system.  Hydroureter is evident.  There is a superimposed simple appearing exophytic renal cyst measuring up to 48 mm diameter. Renal length is 12.9 cm.  Bladder:  At least partially decompressed, and a Foley catheter balloon is partially visible.  However, to complex appearing cystic  structures are noted adjacent to the bladder.  These might be bladder diverticula containing debris, uncertain.  The bladder also has a heterogeneous appearance.  Furthermore there is a linear echogenic structure partially visible which might represent a stent, uncertain.  IMPRESSION: Left greater than right bilateral obstructive uropathy.  The obstructing etiology is not identified, but the bladder is very heterogeneous such that an obstructing bladder mass is not excluded. There is evidence of echogenic debris in the obstructed right side system, which may indicate pyonephrosis.  Additionally, there may be bladder diverticula containing debris. Overall, recommend follow-up CT abdomen and pelvis (can be done without contrast if necessary) to better characterize.  Original Report Authenticated By: Harley Hallmark, M.D.   Dg Chest Port 1 View  01/12/2012  *RADIOLOGY REPORT*  Clinical Data: T kidney.  PORTABLE CHEST - 1 VIEW  Comparison: 01/11/2012.  Findings: Mild pulmonary vascular congestion.  Slight increased markings lung bases may represent atelectasis.  Subtle infiltrate not entirely excluded.  Hiatal hernia.  Heart size top normal. Minimally tortuous aorta.  No pneumothorax.  IMPRESSION: Pulmonary vascular congestion.  Slight increased markings lung bases as noted above.  Hiatal hernia.  Original Report Authenticated By: Fuller Canada, M.D.   Dg Chest Port 1 View  01/11/2012  *RADIOLOGY REPORT*  Clinical Data: Weakness.  Shortness of breath.  PORTABLE CHEST - 1 VIEW  Comparison: None.  Findings: Enlarged cardiac silhouette.  Moderately large hiatal hernia.  Clear lungs with normal vascularity.  Bilateral acromioclavicular joint degenerative changes.  IMPRESSION:  1.  Cardiomegaly. 2.  Moderately large hiatal hernia.  Original Report Authenticated By: Darrol Angel, M.D.    Assessment/Plan:  Gross hematuria. BPH. Bilateral hydronephrosis.  Leave foley indwelling.     LOS: 1 day    Jack Stevens 01/12/2012, 6:51 PM

## 2012-01-12 NOTE — Progress Notes (Signed)
Upon assessment pt found sleeping with respirations between 26-30 resp. per min. Awoke pt and asked about breathing. Pt stated feeling SOB and upon ausculation was clear with mildly diminished bases bilaterally. O2 sats 96% on Rm Air. Pt with shallow, moderately labored breathing. MD on call notified and orders received. Will cont to monitor Jack Stevens, [employer]

## 2012-01-12 NOTE — Progress Notes (Signed)
Subjective:  No distress  Objective:  Vital Signs in the last 24 hours: Temp:  [98.6 F (37 C)-101.1 F (38.4 C)] 99.4 F (37.4 C) (07/08 1200) Pulse Rate:  [107-122] 119  (07/08 1200) Resp:  [22-31] 27  (07/08 1200) BP: (98-148)/(51-83) 132/69 mmHg (07/08 1200) SpO2:  [92 %-99 %] 99 % (07/08 1200) Weight:  [78.1 kg (172 lb 2.9 oz)-78.7 kg (173 lb 8 oz)] 78.1 kg (172 lb 2.9 oz) (07/08 0457)  Intake/Output from previous day:  Intake/Output Summary (Last 24 hours) at 01/12/12 1256 Last data filed at 01/12/12 1200  Gross per 24 hour  Intake 2614.17 ml  Output   2425 ml  Net 189.17 ml    Physical Exam: General appearance: alert and no distress Lungs: decreased breath sounds Heart: irregularly irregular rhythm   Rate: 110  Rhythm: Looks like only occasion sinus beat, mainly AF  Lab Results:  Basename 01/12/12 0339 01/11/12 1159  WBC 17.1* 21.7*  HGB 8.3* 9.2*  PLT 241 299    Basename 01/12/12 0339 01/11/12 1159  NA 129* 130*  K 3.7 4.8  CL 99 98  CO2 18* 19  GLUCOSE 129* 134*  BUN 66* 69*  CREATININE 3.14* 3.95*    Basename 01/12/12 0339 01/11/12 1947  TROPONINI <0.30 <0.30   Hepatic Function Panel  Basename 01/12/12 0339  PROT 6.6  ALBUMIN 2.1*  AST 13  ALT 9  ALKPHOS 63  BILITOT 0.2*  BILIDIR --  IBILI --   No results found for this basename: CHOL in the last 72 hours  Basename 01/11/12 1159  INR 1.29    Imaging: Imaging results have been reviewed  Cardiac Studies:  Assessment/Plan:   Principal Problem:  *Sepsis Active Problems:  Acute on chronic kidney disease, stage 3  UTI (lower urinary tract infection)  Dehydration  EKG abnormalities  Acute respiratory distress  Hyponatremia  Weakness generalized  Leukocytosis  BPH (benign prostatic hyperplasia)  Anemia  Hematuria  HTN (hypertension), LVH by volts   Plan- echo done, results pending. MD to see. Will add beta blocker, change Norvasc to Diltiazem. EKG changes may all be  secondary to LVH. Add ASA 81mg .   Corine Shelter PA-C 01/12/2012, 12:56 PM    I have seen and examined the patient along with Corine Shelter PA-C.  I have reviewed the chart, notes and new data.  I agree with PA's note.  Key new complaints: none Key examination changes: background rhythm is mild sinus bradycardia, but he has incessant PACs and brief runs of slow PAT (90-110).  Key new findings / data: hyperdynamic LV and RV. LA is borderline enlarged. No serious valve issues.  PLAN: Prefer negative chronotropic drugs instead of vasodilators to treat PAT, but watch for bradycardia.  Thurmon Fair, MD, [facility] [phone] 01/12/2012, 7:27 PM

## 2012-01-12 NOTE — Plan of Care (Signed)
Problem: Consults Goal: General Medical Patient Education See Patient Education Module for specific education.  Outcome: Progressing Pt responsive to teaching, but may not comprehend information well due to current illness.  Continued teaching and updates to be given.  Awaiting Caregiver and family visitation.  Pt reports daughter is coming to visit today. Goal: Skin Care Protocol Initiated - if indicated If consults are not indicated, leave blank or document N/A  Outcome: Progressing Sacral Stage 1 being monitored.  Pt turned and positioned Q 2 hrs.  Problem: Phase I Progression Outcomes Goal: OOB as tolerated unless otherwise ordered Outcome: Progressing Pt SOB at rest, no plan to get OOB today, unless improvement seen. Goal: Hemodynamically stable Outcome: Progressing Currently

## 2012-01-12 NOTE — Progress Notes (Signed)
Subjective: Patient states shortness of breath is worsening. Patient is tachypneic and tachycardic. Hematuria clearing up. Patient denies any chest pain.  Objective: Vital signs in last 24 hours: Filed Vitals:   01/12/12 0445 01/12/12 0457 01/12/12 0700 01/12/12 0800  BP: 147/65  126/58 123/51  Pulse:   122 118  Temp:  101.1 F (38.4 C)  99.1 F (37.3 C)  TempSrc:  Oral  Oral  Resp: 30  24 28   Height:      Weight:  78.1 kg (172 lb 2.9 oz)    SpO2: 96%  93% 92%    Intake/Output Summary (Last 24 hours) at 01/12/12 0908 Last data filed at 01/12/12 0747  Gross per 24 hour  Intake 1789.17 ml  Output   1975 ml  Net -185.83 ml    Weight change:   General: Alert, awake, oriented to self and thinks  he's in [place], in no acute distress. Tachypneic. HEENT: No bruits, no goiter. Heart: Tachycardia, without murmurs, rubs, gallops. Lungs: Clear to auscultation bilaterally. Abdomen: Soft, nontender, nondistended, positive bowel sounds. Extremities: No clubbing cyanosis or edema with positive pedal pulses. Neuro: Grossly intact, nonfocal.    Lab Results:  Basename 01/12/12 0339 01/11/12 1159  NA 129* 130*  K 3.7 4.8  CL 99 98  CO2 18* 19  GLUCOSE 129* 134*  BUN 66* 69*  CREATININE 3.14* 3.95*  CALCIUM 8.7 9.1  MG -- 1.9  PHOS -- --    Basename 01/12/12 0339 01/11/12 1159  AST 13 18  ALT 9 12  ALKPHOS 63 67  BILITOT 0.2* 0.2*  PROT 6.6 7.2  ALBUMIN 2.1* 2.4*   No results found for this basename: LIPASE:2,AMYLASE:2 in the last 72 hours  Basename 01/12/12 0339 01/11/12 1159  WBC 17.1* 21.7*  NEUTROABS 16.3* 20.4*  HGB 8.3* 9.2*  HCT 24.2* 27.2*  MCV 91.7 93.8  PLT 241 299    Basename 01/12/12 0339 01/11/12 1947 01/11/12 1159  CKTOTAL 34 53 60  CKMB 2.0 2.2 3.2  CKMBINDEX -- -- --  TROPONINI <0.30 <0.30 <0.30   No components found with this basename: POCBNP:3 No results found for this basename: DDIMER:2 in the last 72 hours No results found for this  basename: HGBA1C:2 in the last 72 hours No results found for this basename: CHOL:2,HDL:2,LDLCALC:2,TRIG:2,CHOLHDL:2,LDLDIRECT:2 in the last 72 hours  Basename 01/11/12 1159  TSH 1.354  T4TOTAL --  T3FREE --  THYROIDAB --    Basename 01/11/12 1503  VITAMINB12 373  FOLATE 19.3  FERRITIN 165  TIBC Not calculated due to Iron <10.  IRON <10*  RETICCTPCT --    Micro Results: Recent Results (from the past 240 hour(s))  CULTURE, BLOOD (ROUTINE X 2)     Status: Normal (Preliminary result)   Collection Time   01/11/12  3:03 PM      Component Value Range Status Comment   Specimen Description BLOOD RIGHT ARM  5 ML IN [facility] BOTTLE   Final    Special Requests NONE   Final    Culture  Setup Time 01/11/2012 22:25   Final    Culture     Final    Value:        BLOOD CULTURE RECEIVED NO GROWTH TO DATE CULTURE WILL BE HELD FOR 5 DAYS BEFORE ISSUING A FINAL NEGATIVE REPORT   Report Status PENDING   Incomplete   CULTURE, BLOOD (ROUTINE X 2)     Status: Normal (Preliminary result)   Collection Time   01/11/12  3:36  PM      Component Value Range Status Comment   Specimen Description BLOOD RIGHT HAND  3 ML IN [facility] BOTTLE   Final    Special Requests NONE   Final    Culture  Setup Time 01/11/2012 22:25   Final    Culture     Final    Value:        BLOOD CULTURE RECEIVED NO GROWTH TO DATE CULTURE WILL BE HELD FOR 5 DAYS BEFORE ISSUING A FINAL NEGATIVE REPORT   Report Status PENDING   Incomplete   MRSA PCR SCREENING     Status: Abnormal   Collection Time   01/11/12  4:37 PM      Component Value Range Status Comment   MRSA by PCR POSITIVE (*) NEGATIVE Final     Studies/Results: Dg Chest Port 1 View  01/11/2012  *RADIOLOGY REPORT*  Clinical Data: Weakness.  Shortness of breath.  PORTABLE CHEST - 1 VIEW  Comparison: None.  Findings: Enlarged cardiac silhouette.  Moderately large hiatal hernia.  Clear lungs with normal vascularity.  Bilateral acromioclavicular joint degenerative changes.  IMPRESSION:  1.   Cardiomegaly. 2.  Moderately large hiatal hernia.  Original Report Authenticated By: Darrol Angel, M.D.    Medications:     . cefTRIAXone (ROCEPHIN)  IV  1 g Intravenous Once  . Chlorhexidine Gluconate Cloth  6 each Topical Q0600  . dutasteride  0.5 mg Oral Daily  . levalbuterol  1.25 mg Nebulization Once  . mupirocin ointment  1 application Nasal BID  . pantoprazole (PROTONIX) IV  40 mg Intravenous Q24H  . piperacillin-tazobactam (ZOSYN)  IV  2.25 g Intravenous STAT  . piperacillin-tazobactam (ZOSYN)  IV  2.25 g Intravenous Q8H  . sertraline  150 mg Oral Daily  . sodium chloride  500 mL Intravenous Once  . sodium chloride  500 mL Intravenous Once  . sodium chloride  3 mL Intravenous Q12H  . Tamsulosin HCl  0.4 mg Oral QPC supper  . DISCONTD: sodium chloride   Intravenous STAT    Assessment: Principal Problem:  *Sepsis Active Problems:  Acute on chronic kidney disease, stage 3  UTI (lower urinary tract infection)  Dehydration  EKG abnormalities  Hyponatremia  Weakness generalized  Leukocytosis  BPH (benign prostatic hyperplasia)  Anemia  Acute blood loss anemia  Acute respiratory distress  Hematuria   Plan: #1 sepsis Likely secondary to UTI. Patient is tachycardic with heart rate of 118 patient is also tachypneic with respiratory rate in the mid to high 30s patient's WBC is trending down and now at 17.1 from 21.7 on admission. Lactic acid level is trending down and a 1.4 today from 2.7. Pro calcitonin is trending down at 22.14 from 33.30. Will increase patient's IV fluids to 125 cc per hour. Urine cultures are pending. Blood cultures are pending. Continue empiric IV Zosyn. Follow.  #2 acute respiratory distress Likely secondary to problem #1. Will repeat a chest x-ray this morning. We'll get a stat ABG. Lactic acid and pro calcitonin levels are trending down. Blood cultures and urine cultures are pending. Continue empiric IV Zosyn. We'll consult with PCP for further  evaluation and management.  #3 UTI Urine cultures are pending. Continue empiric IV Zosyn.  #4 gross hematuria Hematuria is slowly improving urine is clearing up. Continue indwelling Foley catheter and irrigate as needed. Renal ultrasound is pending. Urology is following a recommended cystoscopy when patient is medically stable. Per urology.  #5 acute on chronic kidney disease stage  III Patient on discharge from [place] on 01/06/2012 and a creatinine of 1.29. Admission creatinine was 3.95. Likely secondary to post renal azotemia likely secondary to obstruction from BPH in the setting of volume depletion and dehydration and sepsis. Patient is not on any nephrotoxins. Renal function is slowly trending down. Renal ultrasound is pending. Increase IV fluids to 125 cc per hour. Follow. Urology is following and appreciate input and recommendations.   #6 dehydration IV fluids  #7 EKG changes Patient with shortness of breath however denies any chest pain. Cardiac enzymes are negative x3. 2-D echo is pending. Repeat EKG with T wave inversion in leads 1 aVL V4 through V6. Unable to place on aspirin secondary to gross hematuria. Per cardiology patient will need ischemic evaluation once medically stable with probable initial Myoview scan. Cardiology is following and appreciate input and recommendations.  #8 BPH Foley catheter. Per urology. Patient will likely need a cystoscopy once medically stable.  #9 hyponatremia Likely secondary to volume depletion. TSH is within normal limits. Chest x-ray yesterday was negative. Increase IV fluids and follow.  #10 depression/anxiety Continue home regimen of Zoloft and Xanax.  #11 hypertension Patient's metoprolol and Norvasc on hold. As blood pressure stabilizes will slowly resume metoprolol first secondary to EKG changes.  #12 prophylaxis PPI for GI prophylaxis. SCDs for DVT prophylaxis.  Critical care time spent 40 minutes.    LOS: 1 day    Lattie Cervi [phone] 01/12/2012, 9:08 AM

## 2012-01-12 NOTE — Progress Notes (Signed)
  Echocardiogram 2D Echocardiogram has been performed.  Lafonda Patron 01/12/2012, 10:14 AM

## 2012-01-12 NOTE — Progress Notes (Signed)
ANTIBIOTIC CONSULT NOTE - INITIAL  Pharmacy Consult for Vancomycin/Zosyn Indication: Sepsis   No Known Allergies  Patient Measurements: Height: 5\' 9"  (175.3 cm) Weight: 172 lb 2.9 oz (78.1 kg) IBW/kg (Calculated) : 70.7    Vital Signs: Temp: 99.4 F (37.4 C) (07/08 1200) Temp src: Oral (07/08 1200) BP: 132/69 mmHg (07/08 1200) Pulse Rate: 119  (07/08 1200) Intake/Output from previous day: 07/07 0701 - 07/08 0700 In: 1789.2 [P.O.:360; I.V.:1316.7; IV Piggyback:112.5] Out: 1700 [Urine:1700] Intake/Output from this shift: Total I/O In: 825 [I.V.:625; IV Piggyback:200] Out: 725 [Urine:725]  Labs:  [facility] 01/12/12 0339 01/11/12 1636 01/11/12 1159  WBC 17.1* -- 21.7*  HGB 8.3* -- 9.2*  PLT 241 -- 299  LABCREA -- 112.34 --  CREATININE 3.14* -- 3.95*   Estimated Creatinine Clearance: 16.6 ml/min (by C-G formula based on Cr of 3.14). No results found for this basename: VANCOTROUGH:2,VANCOPEAK:2,VANCORANDOM:2,GENTTROUGH:2,GENTPEAK:2,GENTRANDOM:2,TOBRATROUGH:2,TOBRAPEAK:2,TOBRARND:2,AMIKACINPEAK:2,AMIKACINTROU:2,AMIKACIN:2, in the last 72 hours   Microbiology: Recent Results (from the past 720 hour(s))  URINE CULTURE     Status: Normal (Preliminary result)   Collection Time   01/11/12 11:47 AM      Component Value Range Status Comment   Specimen Description URINE, CATHETERIZED   Final    Special Requests NONE   Final    Culture  Setup Time 01/11/2012 16:43   Final    Colony Count >=100,000 COLONIES/ML   Final    Culture     Final    Value: STAPHYLOCOCCUS AUREUS     Note: RIFAMPIN AND GENTAMICIN SHOULD NOT BE USED AS SINGLE DRUGS FOR TREATMENT OF STAPH INFECTIONS.   Report Status PENDING   Incomplete   CULTURE, BLOOD (ROUTINE X 2)     Status: Normal (Preliminary result)   Collection Time   01/11/12  3:03 PM      Component Value Range Status Comment   Specimen Description BLOOD RIGHT ARM  5 ML IN [facility] BOTTLE   Final    Special Requests NONE   Final    Culture  Setup Time  01/11/2012 22:25   Final    Culture     Final    Value:        BLOOD CULTURE RECEIVED NO GROWTH TO DATE CULTURE WILL BE HELD FOR 5 DAYS BEFORE ISSUING A FINAL NEGATIVE REPORT   Report Status PENDING   Incomplete   CULTURE, BLOOD (ROUTINE X 2)     Status: Normal (Preliminary result)   Collection Time   01/11/12  3:36 PM      Component Value Range Status Comment   Specimen Description BLOOD RIGHT HAND  3 ML IN [facility] BOTTLE   Final    Special Requests NONE   Final    Culture  Setup Time 01/11/2012 22:25   Final    Culture     Final    Value: GRAM POSITIVE COCCI IN CLUSTERS     Note: Gram Stain Report Called to,Read Back By and Verified With: CONRAD WHITROW 01/12/12 1050 BY SMITHERSJ   Report Status PENDING   Incomplete   MRSA PCR SCREENING     Status: Abnormal   Collection Time   01/11/12  4:37 PM      Component Value Range Status Comment   MRSA by PCR POSITIVE (*) NEGATIVE Final     Medical History: Past Medical History  Diagnosis Date  . Renal disorder   . Chronic renal failure   . BPH (benign prostatic hyperplasia)   . Seizure   . Hypertension   .  Depression   . MGUS (monoclonal gammopathy of unknown significance)   . Anxiety   . PTSD (post-traumatic stress disorder)   . Diverticulitis   . Anemia   . Pneumonia     Medications:  Scheduled:    . cefTRIAXone (ROCEPHIN)  IV  1 g Intravenous Once  . Chlorhexidine Gluconate Cloth  6 each Topical Q0600  . dutasteride  0.5 mg Oral Daily  . levalbuterol  1.25 mg Nebulization Once  . mupirocin ointment  1 application Nasal BID  . pantoprazole (PROTONIX) IV  40 mg Intravenous Q24H  . piperacillin-tazobactam (ZOSYN)  IV  2.25 g Intravenous STAT  . piperacillin-tazobactam (ZOSYN)  IV  2.25 g Intravenous Q8H  . sertraline  150 mg Oral Daily  . sodium chloride  500 mL Intravenous Once  . sodium chloride  3 mL Intravenous Q12H  . Tamsulosin HCl  0.4 mg Oral QPC supper  . vancomycin  1,000 mg Intravenous Q48H  . DISCONTD: sodium  chloride   Intravenous STAT  . DISCONTD: nicotine  14 mg Transdermal Daily   Infusions:    . sodium chloride 125 mL/hr at 01/12/12 0808   PRN: acetaminophen, acetaminophen, ALPRAZolam, alum & mag hydroxide-simeth, bisacodyl, ondansetron (ZOFRAN) IV, ondansetron, oxyCODONE, polyethylene glycol Assessment:  78 yo M with presumed sepsis from UTI, on D2 of Zosyn and starting Vancomycin today.    Acute on chronic renal failure with baseline Scr 1.9, now improving but elevated at 3.1 (CrCl 16 ml/min).   WBC elevated but improving, 17.1 today  Micro: GPC in 1/2 blood cultures; > 100 K Staph in Urine culture  Goal of Therapy:  Vancomycin trough 15-20 Zosyn per renal function   Plan:  1.) Vancomycin 1gm iv q48h  2.) Zosyn 2.25 gm IV, change to Q6h 3.) Monitor renal function and cultures pending  Sujey Gundry, Loma Messing PharmD 1:00 PM 01/12/2012

## 2012-01-12 NOTE — Progress Notes (Signed)
PT Cancellation Note  __x_Treatment cancelled today due to medical issues with patient which prohibited Physical therapy per RN respiratory distress.  ___ Treatment cancelled today due to patient receiving procedure or test   ___ Treatment cancelled today due to patient's refusal to participate   ___ Treatment cancelled today due to

## 2012-01-12 NOTE — Consult Note (Signed)
Name: Jack Stevens MRN: [id number] DOB: 1921-12-17    LOS: 1  Referring Provider:  [facility] Reason for Referral:  Tachycardia, tachypnea, questionable sepsis.   PULMONARY / CRITICAL CARE MEDICINE  HPI:  [name] who was just discharged from [facility] 01/06/12 after being admitted from [facility] for acute on chronic renal failure. Base creatine 1.9. Foley was placed with balloon in the prostate anf urology was called with bladder irrigation. Renal US revealed no change in previous hydronephrosis.  He was discharged to local NH and presents to [facility] 7/7 with a creatine of 3.95, new hematuria followed by Dr Brunilda Payor and foley placement. 7/8 PCCM is consulted for increased wob, presumed early sirs. He is a full code.  Past Medical History  Diagnosis Date  . Renal disorder   . Chronic renal failure   . BPH (benign prostatic hyperplasia)   . Seizure   . Hypertension   . Depression   . MGUS (monoclonal gammopathy of unknown significance)   . Anxiety   . PTSD (post-traumatic stress disorder)   . Diverticulitis   . Anemia   . Pneumonia    Past Surgical History  Procedure Date  . Tonsillectomy    Prior to Admission medications   Medication Sig Start Date End Date Taking? Authorizing Provider  acetaminophen (TYLENOL) 325 MG tablet Take by mouth every 6 (six) hours as needed. 1 to 2 tablets every 4 hours as needed for pain or temperature exceeding 100.21F. Not to exceed 4gm APAP/24 hrs.   Yes Historical Provider, MD  ALPRAZolam Prudy Feeler) 0.5 MG tablet Take 0.5 mg by mouth 3 (three) times daily as needed. Anxiety.   Yes Historical Provider, MD  amLODipine (NORVASC) 5 MG tablet Take 5 mg by mouth daily.   Yes Historical Provider, MD  dutasteride (AVODART) 0.5 MG capsule Take 0.5 mg by mouth daily.   Yes Historical Provider, MD  ferrous sulfate 325 (65 FE) MG tablet Take 325 mg by mouth daily with breakfast.   Yes Historical Provider, MD  hydrocerin (EUCERIN) CREA Apply 1 application topically 2 (two) times daily as  needed. Apply to affected area on back every 12 hours as needed for rash.   Yes Historical Provider, MD  HYDROcodone-acetaminophen (NORCO) 5-325 MG per tablet Take by mouth every 4 (four) hours as needed. Take 1 to 2 tablets every 4 hours as needed; not to exceed 4gm APAP/24 hrs.   Yes Historical Provider, MD  metoprolol tartrate (LOPRESSOR) 25 MG tablet Take 25 mg by mouth 2 (two) times daily.   Yes Historical Provider, MD  Multiple Vitamin (MULTIVITAMIN WITH MINERALS) TABS Take 1 tablet by mouth daily.   Yes Historical Provider, MD  nicotine (NICODERM CQ - DOSED IN MG/24 HR) 7 mg/24hr patch Place 1 patch onto the skin daily.   Yes Historical Provider, MD  nystatin (MYCOSTATIN) powder Apply 1 Units topically 2 (two) times daily. To affected area (unspecified) for fungal infection.   Yes Historical Provider, MD  ondansetron (ZOFRAN-ODT) 4 MG disintegrating tablet Take 4 mg by mouth every 6 (six) hours as needed. Nausea/vomiting.   Yes Historical Provider, MD  pantoprazole (PROTONIX) 40 MG tablet Take 40 mg by mouth every morning.   Yes Historical Provider, MD  sertraline (ZOLOFT) 100 MG tablet Take 150 mg by mouth daily.   Yes Historical Provider, MD  sodium chloride 0.9 % infusion Inject 2 mLs into the vein every 6 (six) hours. For IV push.   Yes Historical Provider, MD  Tamsulosin HCl (FLOMAX) 0.4  MG CAPS Take 0.4 mg by mouth daily. After a meal.   Yes Historical Provider, MD  triamcinolone cream (KENALOG) 0.1 % Apply 1 application topically 2 (two) times daily. Apply to rash on back until resolved.   Yes Historical Provider, MD   Allergies No Known Allergies  Family History History reviewed. No pertinent family history. Social History  reports that he has been smoking.  He does not have any smokeless tobacco history on file. He reports that he does not drink alcohol or use illicit drugs.  Review Of Systems:  NA due to confusion  Brief patient description:   78 yo wm SN[facility] patient admitted  7/7 to [facility] with AKI related to urethral obstruction from misplaced foley, PCCM consulted for SIRS.  Events Since Admission: 7/7 foley with hematuria per urology 7/8 PCCM consulted   Current Status: Confused but not in acute distress. Vital Signs: Temp:  [98.1 F (36.7 C)-101.1 F (38.4 C)] 99.1 F (37.3 C) (07/08 0800) Pulse Rate:  [76-122] 118  (07/08 0800) Resp:  [18-31] 28  (07/08 0800) BP: (98-148)/(47-83) 123/51 mmHg (07/08 0800) SpO2:  [92 %-98 %] 92 % (07/08 0800) Weight:  [172 lb 2.9 oz (78.1 kg)-173 lb 8 oz (78.7 kg)] 172 lb 2.9 oz (78.1 kg) (07/08 0457)  Physical Examination: General:  Frail, confused, [name]. Neuro:  Confused but otherwise intact HEENT:  Perl, eom intact, no adenopathy, oral mucosa dry Neck:  No JVD Cardiovascular:  Hsd, st Lungs:  Decreased bs bases Abdomen:  Tight but non tender Musculoskeletal:  Wasted muculature Skin:  Cool ext  Principal Problem:  *Sepsis Active Problems:  Acute on chronic kidney disease, stage 3  UTI (lower urinary tract infection)  Dehydration  EKG abnormalities  Hyponatremia  Weakness generalized  Leukocytosis  BPH (benign prostatic hyperplasia)  Anemia  Acute blood loss anemia  Acute respiratory distress  Hematuria   ASSESSMENT AND PLAN  PULMONARY  Lab 01/12/12 0900  PHART 7.419  PCO2ART 27.7*  PO2ART 91.2  HCO3 17.6*  O2SAT 98.9   Ventilator Settings:   CXR:   Dg Chest Port 1 View  01/12/2012  *RADIOLOGY REPORT*  Clinical Data: T kidney.  PORTABLE CHEST - 1 VIEW  Comparison: 01/11/2012.  Findings: Mild pulmonary vascular congestion.  Slight increased markings lung bases may represent atelectasis.  Subtle infiltrate not entirely excluded.  Hiatal hernia.  Heart size top normal. Minimally tortuous aorta.  No pneumothorax.  IMPRESSION: Pulmonary vascular congestion.  Slight increased markings lung bases as noted above.  Hiatal hernia.  Original Report Authenticated By: Fuller Canada, M.D.   Dg Chest Port  1 View  01/11/2012  *RADIOLOGY REPORT*  Clinical Data: Weakness.  Shortness of breath.  PORTABLE CHEST - 1 VIEW  Comparison: None.  Findings: Enlarged cardiac silhouette.  Moderately large hiatal hernia.  Clear lungs with normal vascularity.  Bilateral acromioclavicular joint degenerative changes.  IMPRESSION:  1.  Cardiomegaly. 2.  Moderately large hiatal hernia.  Original Report Authenticated By: Darrol Angel, M.D.    ETT:    A:  Tachypnea most likely secondary to metabolic process  P:   -O2 as needed -BD -Follow CxR -RO aspiration  CARDIOVASCULAR  Lab 01/12/12 0339 01/11/12 1947 01/11/12 1503 01/11/12 1159  TROPONINI <0.30 <0.30 -- <0.30  LATICACIDVEN 1.4 -- 2.7* --  PROBNP -- -- -- --   ECG: Sinus tachycardia with Premature supraventricular complexes ST & T wave abnormality, consider anterolateral ischemia Abnormal ECG  Lines:   A:Tachycardia in setting of  fever. P:  -No tx unless rate >120. Tx underlying issue  -check CE for completeness Cardiac Panel (last 3 results)  Basename 01/12/12 0339 01/11/12 1947 01/11/12 1159  CKTOTAL 34 53 60  CKMB 2.0 2.2 3.2  TROPONINI <0.30 <0.30 <0.30  RELINDX RELATIVE INDEX IS INVALID RELATIVE INDEX IS INVALID RELATIVE INDEX IS INVALID  -May need cvl for fluid balance RENAL  Lab 01/12/12 0339 01/11/12 1159  NA 129* 130*  K 3.7 4.8  CL 99 98  CO2 18* 19  BUN 66* 69*  CREATININE 3.14* 3.95*  CALCIUM 8.7 9.1  MG -- 1.9  PHOS -- --   Intake/Output      07/07 0701 - 07/08 0700 07/08 0701 - 07/09 0700   P.O. 360    I.V. (mL/kg) 1316.7 (16.9)    IV Piggyback 112.5    Total Intake(mL/kg) 1789.2 (22.9)    Urine (mL/kg/hr) 1700 (0.9) 275   Total Output 1700 275   Net +89.2 -275        Urine Occurrence 1 x     Foley:  7/7  A:  Acute on chronic renal failure with base creatine 1.29. Hematuria P:   -foley placed -renal US -urology consulted -renal US 7/8>>  GASTROINTESTINAL  Lab 01/12/12 0339 01/11/12 1159  AST 13 18    ALT 9 12  ALKPHOS 63 67  BILITOT 0.2* 0.2*  PROT 6.6 7.2  ALBUMIN 2.1* 2.4*    A: no acute issuse P:   -monitor  HEMATOLOGIC  Lab 01/12/12 0339 01/11/12 1159  HGB 8.3* 9.2*  HCT 24.2* 27.2*  PLT 241 299  INR -- 1.29  APTT -- 36   A:  anemia P:  -No tx unless hgb<7.0 or active bleeding  INFECTIOUS  Lab 01/12/12 0339 01/11/12 1503 01/11/12 1159  WBC 17.1* -- 21.7*  PROCALCITON 22.14 33.30 --   Cultures: 7/7 uc>>sa>> 7/7 bc x 2>> gpc in clusters 7/7 mrsa swab ++ Antibiotics: 7/7 roc>>7/7 7/7 zoysn>> 7/8 vanco added>> A:Presumed sepsis from uti(procal 22.14) P:   See flows  ENDOCRINE No results found for this basename: GLUCAP:5 in the last 168 hours A: no acute issuse P:     NEUROLOGIC  A:  AMS , most likely from infection, but may need CT head in future. P:   - , but may need CT head in future BEST PRACTICE / DISPOSITION Level of Care:  icu Primary Service:  [facility] Consultants:  Pccm/urology  Code Status:  full Diet:  npo DVT Px: scd GI Px: ppi  Skin Integrity:  intact Social / Family:  None at bedside  [facility] Minor ACNP Adolph Pollack PCCM Pager [phone] till 3 pm If no answer page [phone] 01/12/2012, 9:36 AM  Attending:  I have seen and examined the patient with nurse practitioner/resident and agree with and have edited the note above.   Staph in urine, now GPC's in blood.  Hopefully the [facility] will be a contaminant.  Yolonda Kida PCCM Pager: [phone] Cell: [phone] If no response, call [phone]

## 2012-01-12 NOTE — Progress Notes (Signed)
Utilization review completed.  

## 2012-01-12 NOTE — Clinical Documentation Improvement (Signed)
SEPSIS DOCUMENTATION QUERY  THIS DOCUMENT IS NOT A PERMANENT PART OF THE MEDICAL RECORD  TO RESPOND TO THE THIS QUERY, FOLLOW THE INSTRUCTIONS BELOW:  1. If needed, update documentation for the patient's encounter via the notes activity.  2. Access this query again and click edit on the In [employer].  3. After updating, or not, click F2 to complete all highlighted (required) fields concerning your review. Select "additional documentation in the medical record" OR "no additional documentation provided".  4. Click Sign note button.  5. The deficiency will fall out of your In Basket Please let us know if you are not able to complete this workflow by phone or e-mail (listed below).  Please update your documentation within the medical record to reflect your response to this query.                                                                                    01/12/12  Dear Dr. Janee Morn, Algis Downs Marton Redwood,  In a better effort to capture your patient's severity of illness, reflect appropriate length of stay and utilization of resources, a review of the patient medical record has revealed the following indicators.    Based on your clinical judgment, please clarify and document in a progress note and/or discharge summary the clinical condition associated with the following supporting information:  In responding to this query please exercise your independent judgment.  The fact that a query is asked, does not imply that any particular answer is desired or expected.   Pt admitted for Sepsis d/t UTI.  Pt has chronic indwelling catheter.    Please clarify if there is a link between the Sepsis/UTI and the device (foley catheter) and document in the pn or d/c summary.   Possible Clinical Conditions? Sepsis with UTI due to foley catheter Sepsis due to an internal device  Bacterial infection of unknown etiology / source Other Condition __________________ Cannot clinically Determine  _______________  Risk Factors: Sepsis due to UTI, chronic indwelling foley catheter, Acute on Chronic kidney disease, ABLA., Leukocytosis    Presenting Signs and Symptoms: HP history of BPH with chronic indwelling Foley catheter, Will change Foley catheter in place a 24 inch Foley with three-way.   Diagnostics: Blood pressure 98/53, pulse 107, temperature 98.1 F (36.7 C), temperature source Oral, resp. rate 29, SpO2 95.00%.  Component     Latest Ref Rng 01/12/2012          WBC     4.0 - 10.5 K/uL 17.1 (H)   Component     Latest Ref Rng 01/12/2012          Neutrophils Relative     43 - 77 % 95 (H)   Component     Latest Ref Rng 01/12/2012          WBC Morphology      MILD LEFT SHIFT (1-5% METAS, OCC MYELO, OCC BANDS)    Cultures: Blood cultures in process  Treatment: piperacillin-tazobactam (ZOSYN)  Reviewed:  no additional documentation provided ljh  Thank You,  Enis Slipper  RN, BSN, CCDS Clinical Documentation Specialist [facility] Dept Pager: [phone] / E-mail: [email or web address]  Health  Information Management [place]

## 2012-01-12 NOTE — Progress Notes (Signed)
OT Cancellation Note  Treatment cancelled today due to medical issues with patient which prohibited therapy. Will check back another time.  Yulisa Chirico A OTR/L 01/12/2012, 12:12 PM

## 2012-01-13 DIAGNOSIS — E860 Dehydration: Secondary | ICD-10-CM

## 2012-01-13 DIAGNOSIS — R7881 Bacteremia: Secondary | ICD-10-CM

## 2012-01-13 DIAGNOSIS — A4901 Methicillin susceptible Staphylococcus aureus infection, unspecified site: Secondary | ICD-10-CM

## 2012-01-13 LAB — CBC WITH DIFFERENTIAL/PLATELET
Eosinophils Relative: 0 % (ref 0–5)
HCT: 22.2 % — ABNORMAL LOW (ref 39.0–52.0)
Lymphocytes Relative: 2 % — ABNORMAL LOW (ref 12–46)
Lymphs Abs: 0.4 10*3/uL — ABNORMAL LOW (ref 0.7–4.0)
MCH: 31.4 pg (ref 26.0–34.0)
MCV: 92.9 fL (ref 78.0–100.0)
Monocytes Absolute: 0.7 10*3/uL (ref 0.1–1.0)
RBC: 2.39 MIL/uL — ABNORMAL LOW (ref 4.22–5.81)
WBC: 15.6 10*3/uL — ABNORMAL HIGH (ref 4.0–10.5)

## 2012-01-13 LAB — COMPREHENSIVE METABOLIC PANEL
ALT: 12 U/L (ref 0–53)
AST: 16 U/L (ref 0–37)
Alkaline Phosphatase: 81 U/L (ref 39–117)
CO2: 20 mEq/L (ref 19–32)
Calcium: 8.7 mg/dL (ref 8.4–10.5)
GFR calc non Af Amer: 23 mL/min — ABNORMAL LOW (ref >90)
Potassium: 3.6 mEq/L (ref 3.5–5.1)
Sodium: 134 mEq/L — ABNORMAL LOW (ref 135–145)
Total Protein: 6.2 g/dL (ref 6.0–8.3)

## 2012-01-13 LAB — BLOOD GAS, ARTERIAL
Acid-base deficit: 5.7 mmol/L — ABNORMAL HIGH (ref 0.0–2.0)
TCO2: 16.7 mmol/L (ref 0–100)
pCO2 arterial: 27.7 mmHg — ABNORMAL LOW (ref 35.0–45.0)
pH, Arterial: 7.419 (ref 7.350–7.450)
pO2, Arterial: 91.2 mmHg (ref 80.0–100.0)

## 2012-01-13 NOTE — Progress Notes (Addendum)
Name: Jack Stevens MRN: [id number] DOB: 03-04-1922    LOS: 2  Referring Provider:  [facility] Reason for Referral:  Tachycardia, tachypnea, questionable sepsis.   PULMONARY / CRITICAL CARE MEDICINE  HPI:  [name] who was just discharged from [facility] 01/06/12 after being admitted from [facility] for acute on chronic renal failure. Base creatine 1.9. Foley was placed with balloon in the prostate anf urology was called with bladder irrigation. Renal US revealed no change in previous hydronephrosis.  He was discharged to local NH and presents to [facility] 7/7 with a creatine of 3.95, new hematuria followed by Dr Brunilda Payor and foley placement. 7/8 PCCM is consulted for increased wob, presumed early sirs. He is a full code.   Brief patient description:   78 yo wm SNF patient admitted 7/7 to [facility] with AKI related to urethral obstruction from misplaced foley, PCCM consulted for SIRS.  Events Since Admission: 7/7 foley with hematuria per urology 7/8 PCCM consulted   Current Status: Confused but not in acute distress. Vital Signs: Temp:  [97.8 F (36.6 C)-99.4 F (37.4 C)] 98.4 F (36.9 C) (07/09 0800) Pulse Rate:  [69-119] 79  (07/09 0600) Resp:  [17-30] 24  (07/09 0600) BP: (94-146)/(37-69) 117/57 mmHg (07/09 0600) SpO2:  [94 %-99 %] 94 % (07/09 0600) Weight:  [179 lb 7.3 oz (81.4 kg)] 179 lb 7.3 oz (81.4 kg) (07/09 0000)  Physical Examination: General:  Frail, confused, [name].Sitting in chair Neuro:  Confused but otherwise intact HEENT:  Perl, eom intact, no adenopathy, oral mucosa dry Neck:  No JVD Cardiovascular:  Hsd, st Lungs:  Decreased bs bases Abdomen:  Tight but non tender Musculoskeletal:  Wasted muculature Skin:  Cool ext  Principal Problem:  *Sepsis Active Problems:  Acute on chronic kidney disease, stage 3  UTI (lower urinary tract infection)  Dehydration  EKG abnormalities  Hyponatremia  Weakness generalized  Leukocytosis  BPH (benign prostatic hyperplasia)  Anemia  Acute  respiratory distress  Hematuria  HTN (hypertension), LVH by volts  Bacteremia due to Staphylococcus aureus   ASSESSMENT AND PLAN  PULMONARY  Lab 01/12/12 0900  PHART 7.419  PCO2ART 27.7*  PO2ART 91.2  HCO3 17.6*  O2SAT 98.9   Ventilator Settings:   CXR:   US Renal  01/12/2012  *RADIOLOGY REPORT*  Clinical Data: 78 year old male with hematuria, UTI, acute renal failure.  RENAL/URINARY TRACT ULTRASOUND COMPLETE  Comparison:  None.  Findings:  Right Kidney:  Hydronephrosis.  Evidence of echogenic debris in the right renal pelvis and proximal ureter.  Hydroureter is evident. Renal length 11.5 cm.  No focal right renal lesion is identified.  Left Kidney:  Hydronephrosis, moderate to severe in greater than that on the right.  No debris evident in the left renal collecting system.  Hydroureter is evident.  There is a superimposed simple appearing exophytic renal cyst measuring up to 48 mm diameter. Renal length is 12.9 cm.  Bladder:  At least partially decompressed, and a Foley catheter balloon is partially visible.  However, to complex appearing cystic structures are noted adjacent to the bladder.  These might be bladder diverticula containing debris, uncertain.  The bladder also has a heterogeneous appearance.  Furthermore there is a linear echogenic structure partially visible which might represent a stent, uncertain.  IMPRESSION: Left greater than right bilateral obstructive uropathy.  The obstructing etiology is not identified, but the bladder is very heterogeneous such that an obstructing bladder mass is not excluded. There is evidence of echogenic debris in the obstructed right  side system, which may indicate pyonephrosis.  Additionally, there may be bladder diverticula containing debris. Overall, recommend follow-up CT abdomen and pelvis (can be done without contrast if necessary) to better characterize.  Original Report Authenticated By: Harley Hallmark, M.D.   Dg Chest Port 1 View  01/12/2012   *RADIOLOGY REPORT*  Clinical Data: T kidney.  PORTABLE CHEST - 1 VIEW  Comparison: 01/11/2012.  Findings: Mild pulmonary vascular congestion.  Slight increased markings lung bases may represent atelectasis.  Subtle infiltrate not entirely excluded.  Hiatal hernia.  Heart size top normal. Minimally tortuous aorta.  No pneumothorax.  IMPRESSION: Pulmonary vascular congestion.  Slight increased markings lung bases as noted above.  Hiatal hernia.  Original Report Authenticated By: Fuller Canada, M.D.   Dg Chest Port 1 View  01/11/2012  *RADIOLOGY REPORT*  Clinical Data: Weakness.  Shortness of breath.  PORTABLE CHEST - 1 VIEW  Comparison: None.  Findings: Enlarged cardiac silhouette.  Moderately large hiatal hernia.  Clear lungs with normal vascularity.  Bilateral acromioclavicular joint degenerative changes.  IMPRESSION:  1.  Cardiomegaly. 2.  Moderately large hiatal hernia.  Original Report Authenticated By: Darrol Angel, M.D.    ETT:    A:  Tachypnea most likely secondary to metabolic process  P:   -O2 as needed -BD -Follow CxR -RO aspiration  CARDIOVASCULAR  Lab 01/13/12 0327 01/12/12 0339 01/11/12 1947 01/11/12 1503 01/11/12 1159  TROPONINI -- <0.30 <0.30 -- <0.30  LATICACIDVEN 0.9 1.4 -- 2.7* --  PROBNP -- -- -- -- --   ECG: Sinus tachycardia with Premature supraventricular complexes ST & T wave abnormality, consider anterolateral ischemia Abnormal ECG  Lines:   A:Tachycardia in setting of fever. P:  -No tx unless rate >120. Tx underlying issue  -check CE for completeness Cardiac Panel (last 3 results)  Basename 01/12/12 0339 01/11/12 1947 01/11/12 1159  CKTOTAL 34 53 60  CKMB 2.0 2.2 3.2  TROPONINI <0.30 <0.30 <0.30  RELINDX RELATIVE INDEX IS INVALID RELATIVE INDEX IS INVALID RELATIVE INDEX IS INVALID   RENAL  Lab 01/13/12 0327 01/12/12 0339 01/11/12 1159  NA 134* 129* 130*  K 3.6 3.7 --  CL 105 99 98  CO2 20 18* 19  BUN 58* 66* 69*  CREATININE 2.36* 3.14* 3.95*    CALCIUM 8.7 8.7 9.1  MG -- -- 1.9  PHOS -- -- --   Intake/Output      07/08 0701 - 07/09 0700 07/09 0701 - 07/10 0700   P.O.     I.V. (mL/kg) 2875 (35.3)    Other 30    IV Piggyback 360    Total Intake(mL/kg) 3265 (40.1)    Urine (mL/kg/hr) 2335 (1.2)    Total Output 2335    Net +930          Foley:  7/7  A:  Acute on chronic renal failure with base creatine 1.29. Hematuria P:   -foley placed -renal US -urology consulted -renal US 7/8>> -7/9 trend of decreased creatine noted. GASTROINTESTINAL  Lab 01/13/12 0327 01/12/12 0339 01/11/12 1159  AST 16 13 18   ALT 12 9 12   ALKPHOS 81 63 67  BILITOT 0.3 0.2* 0.2*  PROT 6.2 6.6 7.2  ALBUMIN 1.8* 2.1* 2.4*    A: no acute issuse P:   -monitor  HEMATOLOGIC  Lab 01/13/12 0327 01/12/12 0339 01/11/12 1159  HGB 7.5* 8.3* 9.2*  HCT 22.2* 24.2* 27.2*  PLT 193 241 299  INR -- -- 1.29  APTT -- -- 36  A:  anemia P:  -No tx unless hgb<7.0 or active bleeding  INFECTIOUS  Lab 01/13/12 0327 01/12/12 0339 01/11/12 1503 01/11/12 1159  WBC 15.6* 17.1* -- 21.7*  PROCALCITON 11.49 22.14 33.30 --   Cultures: 7/7 uc>> MRSA 7/7 bc x 2>> MRSA 7/7 mrsa swab ++ Antibiotics: 7/7 roc>>7/7 7/7 zoysn>> 7/9 7/8 vanco >>  A: Sepsis, MRSA bacteremia, presumed urinary tract source P:   - See flows - TTE 7/8 >> no evidence of vegetations. Probably not a candidate for valve replacement so would only pursue TEE - Would consider ID consult to consider duration of abx   ENDOCRINE No results found for this basename: GLUCAP:5 in the last 168 hours A: no acute issuse P:     NEUROLOGIC  A:  AMS , most likely from infection, but may need CT head in future. P:   Monitor   Global:  PCCM available PRN starting 7/10  BEST PRACTICE / DISPOSITION Level of Care:  icu Primary Service:  [facility] Consultants:  Pccm/urology  Code Status:  full Diet:  npo DVT Px: scd GI Px: ppi  Skin Integrity:  intact Social / Family:  None at  bedside  [facility] Minor ACNP Adolph Pollack PCCM Pager [phone] till 3 pm If no answer page [phone] 01/13/2012, 10:11 AM    Billy Fischer, MD ; [facility] service Mobile [phone].  After 5:30 PM or weekends, call [phone]

## 2012-01-13 NOTE — Progress Notes (Signed)
Clinical Social Work Department BRIEF PSYCHOSOCIAL ASSESSMENT 01/13/2012  Patient:  Jack Stevens, Jack Stevens     Account Number:  [id number]     Admit date:  01/11/2012  Clinical Social Worker:  Orpah Greek  Date/Time:  01/13/2012 02:13 PM  Referred by:  Physician  Date Referred:  01/13/2012 Referred for  Other - See comment   Other Referral:   Patient was admitted from [facility]   Interview type:  Family Other interview type:    PSYCHOSOCIAL DATA Living Status:  FACILITY Admitted from facility:  [facility] Level of care:  Skilled Nursing Facility Primary support name:  Jack Stevens (daughter) c#: [phone] Primary support relationship to patient:  CHILD, ADULT Degree of support available:   good    CURRENT CONCERNS Current Concerns  Post-Acute Placement   Other Concerns:    SOCIAL WORK ASSESSMENT / PLAN CSW spoke with patient's daughter, Jack Stevens re: discharge planning. Patient was admitted from [facility] and is ok with returning there at discharge.   Assessment/plan status:  Information/Referral to [facility] Other assessment/ plan:   Information/referral to community resources:   CSW confirmed with Jack Stevens @ [facility] that they will have a bed available for patient at discharge. Family is holding the bed @ SNF.    PATIENT'S/FAMILY'S RESPONSE TO PLAN OF CARE: Patient's daughter seems very involved and supportive, is visiting from [place], [place] and states she will stay in [place] until her father goes back to the facility.        Jack Bailey, LCSW Jack Stevens Clinical Social Worker cell #: [phone]  (covering for [employer])

## 2012-01-13 NOTE — Progress Notes (Addendum)
Pts foley had not put out any urine since about 1300 today, I flushed the catheter 2 times with 30cc of sterile water and this was unsuccessful. Bladder scan revealed 600cc of urine in the bladder and the pt was beginning to c/o discomfort and pain in his suprapubic area. Primary attending, Dr. Janee Morn was notified and gave orders to call Dr. Brunilda Payor the urologist. Attempted to call the urology office which was closed and I paged to on call PA- Ashlyn Bruning. She called back with orders to start continuous bladder irrigation. As I was about to set up the continuous bladder irrigation Dr. Brunilda Payor came into the room and we deflated the balloon of the catheter and pushed it back into the bladder and immediately approximately 800cc of bloody urine drained into the foley. Will continue to monitor.

## 2012-01-13 NOTE — Consult Note (Signed)
Date of Admission:  01/11/2012  Date of Consult:  01/13/2012  Reason for Consult: Staph aureus Bacteremia Referring Physician: Thompson  Impression/Recommendation Staph aureus Bacteremia/Urosepsis Obstructive Uropathy Hematuria Acute Renal Failure (on chronic?) Confusion PVD  Would- stop zosyn Continue vancomycin Recheck BCx CT abd/pelvis? To better delineate obstruction seen on u/s.  Check TEE Lower extremity vascular studies  Comment- Could consider that he has bacteremia from his recent manipulation of his foley. Alternatively he could have mass/abscess adjacent to bladder causing bacteremia and uti. Given that he has bacteremia from staph, he is at risk fo endocarditis and TEE is indicated. If his age and mental status are felt to contraindications, will base treatment decisions separately.  Thank you so much for this interesting consult,   Johny Sax [phone]  Jack Stevens is an 78 y.o. male.  HPI: 78 yo M with hx of CRI (stage 3, perv 1.29), BPH with indwelling foley, MGUS, recently d/c from [facility] (Cr 6.67 with urinary obstruction/bilateral hydronephrosis, 6/26-7/2), now adm [facility]-11-12 from SNF with hematuria, weakness for 48h. He was unable to give hx in ED. WBC 21.7 and Cr 3.95, Afebrile.  Pt was felt to be septic from UTI (UA: TNTC WBC). Was started on zosyn. Course complicated by tachycardia and T wave inversions, CPK (-). He developed fever in hospital (101.1 on 01-12-12). Vanco was added 7-8 after his BCx turned positive for GPC.  His course is also complicated by anemia that has worsened in hospital.   Past Medical History  Diagnosis Date  . Renal disorder   . Chronic renal failure   . BPH (benign prostatic hyperplasia)   . Seizure   . Hypertension   . Depression   . MGUS (monoclonal gammopathy of unknown significance)   . Anxiety   . PTSD (post-traumatic stress disorder)   . Diverticulitis   . Anemia   . Pneumonia     Past Surgical History    Procedure Date  . Tonsillectomy    Allergies:  No Known Allergies  Medications:  Scheduled:   . aspirin  81 mg Oral Daily  . Chlorhexidine Gluconate Cloth  6 each Topical Q0600  . diltiazem  120 mg Oral Daily  . dutasteride  0.5 mg Oral Daily  . metoprolol tartrate  25 mg Oral BID  . mupirocin ointment  1 application Nasal BID  . pantoprazole (PROTONIX) IV  40 mg Intravenous Q24H  . piperacillin-tazobactam (ZOSYN)  IV  2.25 g Intravenous Q6H  . sertraline  150 mg Oral Daily  . sodium chloride  3 mL Intravenous Q12H  . Tamsulosin HCl  0.4 mg Oral QPC supper  . vancomycin  1,000 mg Intravenous Q48H  . DISCONTD: piperacillin-tazobactam (ZOSYN)  IV  2.25 g Intravenous Q8H    Social History:  reports that he has been smoking.  He does not have any smokeless tobacco history on file. He reports that he does not drink alcohol or use illicit drugs.  History reviewed. No pertinent family history.  General ROS: pt does not know date (wihthout looking at wall calender and then is unsure, or place).   Blood pressure 117/57, pulse 79, temperature 98.4 F (36.9 C), temperature source Oral, resp. rate 24, height 5\' 9"  (1.753 m), weight 81.4 kg (179 lb 7.3 oz), SpO2 94.00%. General appearance: alert, cooperative and no distress Eyes: negative findings: pupils equal, round, reactive to light and accomodation Throat: normal findings: tongue midline and normal and oropharynx pink & moist without lesions or evidence of thrush Neck:  no adenopathy Lungs: clear to auscultation bilaterally Heart: irregularly irregular rhythm and no murmur Abdomen: normal findings: bowel sounds normal and abnormal findings:  distended and mild pain with palpation, no rebound or gaurding. Extremities: edema 2-3+ Pulses: was unable to feel dorsalis pedis Skin: venous insufficiency, cool feet Neurologic: Mental status: orientation: person Sensory: grossly normal in feet Motor: normal quadriceps/extension Mild  erythema/warmth L forearm.   Results for orders placed during the hospital encounter of 01/11/12 (from the past 48 hour(s))  URINALYSIS, ROUTINE W REFLEX MICROSCOPIC     Status: Abnormal   Collection Time   01/11/12 11:47 AM      Component Value Range Comment   Color, Urine RED (*) YELLOW BIOCHEMICALS MAY BE AFFECTED BY COLOR   APPearance TURBID (*) CLEAR    Specific Gravity, Urine 1.018  1.005 - 1.030    pH 6.0  5.0 - 8.0    Glucose, UA NEGATIVE  NEGATIVE mg/dL    Hgb urine dipstick LARGE (*) NEGATIVE    Bilirubin Urine LARGE (*) NEGATIVE    Ketones, ur 15 (*) NEGATIVE mg/dL    Protein, ur >409 (*) NEGATIVE mg/dL    Urobilinogen, UA 0.2  0.0 - 1.0 mg/dL    Nitrite POSITIVE (*) NEGATIVE    Leukocytes, UA LARGE (*) NEGATIVE   URINE MICROSCOPIC-ADD ON     Status: Abnormal   Collection Time   01/11/12 11:47 AM      Component Value Range Comment   Squamous Epithelial / LPF RARE  RARE    WBC, UA TOO NUMEROUS TO COUNT  <3 WBC/hpf    RBC / HPF TOO NUMEROUS TO COUNT  <3 RBC/hpf    Bacteria, UA FEW (*) RARE    Urine-Other MICROSCOPIC EXAM PERFORMED ON UNCONCENTRATED URINE   FIELD OBSCURED BY RBC'S  URINE CULTURE     Status: Normal (Preliminary result)   Collection Time   01/11/12 11:47 AM      Component Value Range Comment   Specimen Description URINE, CATHETERIZED      Special Requests NONE      Culture  Setup Time 01/11/2012 16:43      Colony Count >=100,000 COLONIES/ML      Culture        Value: STAPHYLOCOCCUS AUREUS     Note: RIFAMPIN AND GENTAMICIN SHOULD NOT BE USED AS SINGLE DRUGS FOR TREATMENT OF STAPH INFECTIONS.   Report Status PENDING     CBC WITH DIFFERENTIAL     Status: Abnormal   Collection Time   01/11/12 11:59 AM      Component Value Range Comment   WBC 21.7 (*) 4.0 - 10.5 K/uL    RBC 2.90 (*) 4.22 - 5.81 MIL/uL    Hemoglobin 9.2 (*) 13.0 - 17.0 g/dL    HCT 81.1 (*) 91.4 - 52.0 %    MCV 93.8  78.0 - 100.0 fL    MCH 31.7  26.0 - 34.0 pg    MCHC 33.8  30.0 - 36.0 g/dL     RDW 78.2  95.6 - 21.3 %    Platelets 299  150 - 400 K/uL    Neutrophils Relative 94 (*) 43 - 77 %    Lymphocytes Relative 1 (*) 12 - 46 %    Monocytes Relative 5  3 - 12 %    Eosinophils Relative 0  0 - 5 %    Basophils Relative 0  0 - 1 %    Neutro Abs 20.4 (*) 1.7 - 7.7  K/uL    Lymphs Abs 0.2 (*) 0.7 - 4.0 K/uL    Monocytes Absolute 1.1 (*) 0.1 - 1.0 K/uL    Eosinophils Absolute 0.0  0.0 - 0.7 K/uL    Basophils Absolute 0.0  0.0 - 0.1 K/uL    WBC Morphology MILD LEFT SHIFT (1-5% METAS, OCC MYELO, OCC BANDS)     COMPREHENSIVE METABOLIC PANEL     Status: Abnormal   Collection Time   01/11/12 11:59 AM      Component Value Range Comment   Sodium 130 (*) 135 - 145 mEq/L    Potassium 4.8  3.5 - 5.1 mEq/L    Chloride 98  96 - 112 mEq/L    CO2 19  19 - 32 mEq/L    Glucose, Bld 134 (*) 70 - 99 mg/dL    BUN 69 (*) 6 - 23 mg/dL    Creatinine, Ser 0.45 (*) 0.50 - 1.35 mg/dL    Calcium 9.1  8.4 - 40.9 mg/dL    Total Protein 7.2  6.0 - 8.3 g/dL    Albumin 2.4 (*) 3.5 - 5.2 g/dL    AST 18  0 - 37 U/L    ALT 12  0 - 53 U/L    Alkaline Phosphatase 67  39 - 117 U/L    Total Bilirubin 0.2 (*) 0.3 - 1.2 mg/dL    GFR calc non Af Amer 12 (*) >90 mL/min    GFR calc Af Amer 14 (*) >90 mL/min   PROTIME-INR     Status: Abnormal   Collection Time   01/11/12 11:59 AM      Component Value Range Comment   Prothrombin Time 16.3 (*) 11.6 - 15.2 seconds    INR 1.29  0.00 - 1.49   APTT     Status: Normal   Collection Time   01/11/12 11:59 AM      Component Value Range Comment   aPTT 36  24 - 37 seconds   CARDIAC PANEL(CRET KIN+CKTOT+MB+TROPI)     Status: Normal   Collection Time   01/11/12 11:59 AM      Component Value Range Comment   Total CK 60  7 - 232 U/L    CK, MB 3.2  0.3 - 4.0 ng/mL    Troponin I <0.30  <0.30 ng/mL    Relative Index RELATIVE INDEX IS INVALID  0.0 - 2.5   MAGNESIUM     Status: Normal   Collection Time   01/11/12 11:59 AM      Component Value Range Comment   Magnesium 1.9  1.5  - 2.5 mg/dL   TSH     Status: Normal   Collection Time   01/11/12 11:59 AM      Component Value Range Comment   TSH 1.354  0.350 - 4.500 uIU/mL   CULTURE, BLOOD (ROUTINE X 2)     Status: Normal (Preliminary result)   Collection Time   01/11/12  3:03 PM      Component Value Range Comment   Specimen Description BLOOD RIGHT ARM  5 ML IN [facility] BOTTLE      Special Requests NONE      Culture  Setup Time 01/11/2012 22:25      Culture        Value: STAPHYLOCOCCUS AUREUS     Note: Gram Stain Report Called to,Read Back By and Verified With: SHEILA MAIN @ 1746 ON 01/12/12 BY GOLLD   Report Status PENDING     LACTIC ACID,  PLASMA     Status: Abnormal   Collection Time   01/11/12  3:03 PM      Component Value Range Comment   Lactic Acid, Venous 2.7 (*) 0.5 - 2.2 mmol/L   PROCALCITONIN     Status: Normal   Collection Time   01/11/12  3:03 PM      Component Value Range Comment   Procalcitonin 33.30     VITAMIN B12     Status: Normal   Collection Time   01/11/12  3:03 PM      Component Value Range Comment   Vitamin B-12 373  211 - 911 pg/mL   FOLATE     Status: Normal   Collection Time   01/11/12  3:03 PM      Component Value Range Comment   Folate 19.3     IRON AND TIBC     Status: Abnormal   Collection Time   01/11/12  3:03 PM      Component Value Range Comment   Iron <10 (*) 42 - 135 ug/dL    TIBC Not calculated due to Iron <10.  215 - 435 ug/dL    Saturation Ratios Not calculated due to Iron <10.  20 - 55 %    UIBC 212  125 - 400 ug/dL   FERRITIN     Status: Normal   Collection Time   01/11/12  3:03 PM      Component Value Range Comment   Ferritin 165  22 - 322 ng/mL   CULTURE, BLOOD (ROUTINE X 2)     Status: Normal (Preliminary result)   Collection Time   01/11/12  3:36 PM      Component Value Range Comment   Specimen Description BLOOD RIGHT HAND  3 ML IN [facility] BOTTLE      Special Requests NONE      Culture  Setup Time 01/11/2012 22:25      Culture        Value: STAPHYLOCOCCUS AUREUS      Note: RIFAMPIN AND GENTAMICIN SHOULD NOT BE USED AS SINGLE DRUGS FOR TREATMENT OF STAPH INFECTIONS.     Note: Gram Stain Report Called to,Read Back By and Verified With: CONRAD WHITROW 01/12/12 1050 BY SMITHERSJ   Report Status PENDING     SODIUM, URINE, RANDOM     Status: Normal   Collection Time   01/11/12  3:52 PM      Component Value Range Comment   Sodium, Ur 42     CREATININE, URINE, RANDOM     Status: Normal   Collection Time   01/11/12  4:36 PM      Component Value Range Comment   Creatinine, Urine 112.34     MRSA PCR SCREENING     Status: Abnormal   Collection Time   01/11/12  4:37 PM      Component Value Range Comment   MRSA by PCR POSITIVE (*) NEGATIVE   CARDIAC PANEL(CRET KIN+CKTOT+MB+TROPI)     Status: Normal   Collection Time   01/11/12  7:47 PM      Component Value Range Comment   Total CK 53  7 - 232 U/L    CK, MB 2.2  0.3 - 4.0 ng/mL    Troponin I <0.30  <0.30 ng/mL    Relative Index RELATIVE INDEX IS INVALID  0.0 - 2.5   CBC     Status: Abnormal   Collection Time   01/12/12  3:39 AM  Component Value Range Comment   WBC 17.1 (*) 4.0 - 10.5 K/uL    RBC 2.64 (*) 4.22 - 5.81 MIL/uL    Hemoglobin 8.3 (*) 13.0 - 17.0 g/dL    HCT 16.1 (*) 09.6 - 52.0 %    MCV 91.7  78.0 - 100.0 fL    MCH 31.4  26.0 - 34.0 pg    MCHC 34.3  30.0 - 36.0 g/dL    RDW 04.5  40.9 - 81.1 %    Platelets 241  150 - 400 K/uL   DIFFERENTIAL     Status: Abnormal   Collection Time   01/12/12  3:39 AM      Component Value Range Comment   Neutrophils Relative 95 (*) 43 - 77 %    Lymphocytes Relative 2 (*) 12 - 46 %    Monocytes Relative 3  3 - 12 %    Eosinophils Relative 0  0 - 5 %    Basophils Relative 0  0 - 1 %    Neutro Abs 16.3 (*) 1.7 - 7.7 K/uL    Lymphs Abs 0.3 (*) 0.7 - 4.0 K/uL    Monocytes Absolute 0.5  0.1 - 1.0 K/uL    Eosinophils Absolute 0.0  0.0 - 0.7 K/uL    Basophils Absolute 0.0  0.0 - 0.1 K/uL    WBC Morphology MILD LEFT SHIFT (1-5% METAS, OCC MYELO, OCC BANDS)   DOHLE  BODIES  COMPREHENSIVE METABOLIC PANEL     Status: Abnormal   Collection Time   01/12/12  3:39 AM      Component Value Range Comment   Sodium 129 (*) 135 - 145 mEq/L    Potassium 3.7  3.5 - 5.1 mEq/L    Chloride 99  96 - 112 mEq/L    CO2 18 (*) 19 - 32 mEq/L    Glucose, Bld 129 (*) 70 - 99 mg/dL    BUN 66 (*) 6 - 23 mg/dL    Creatinine, Ser 9.14 (*) 0.50 - 1.35 mg/dL    Calcium 8.7  8.4 - 78.2 mg/dL    Total Protein 6.6  6.0 - 8.3 g/dL    Albumin 2.1 (*) 3.5 - 5.2 g/dL    AST 13  0 - 37 U/L    ALT 9  0 - 53 U/L    Alkaline Phosphatase 63  39 - 117 U/L    Total Bilirubin 0.2 (*) 0.3 - 1.2 mg/dL    GFR calc non Af Amer 16 (*) >90 mL/min    GFR calc Af Amer 19 (*) >90 mL/min   CARDIAC PANEL(CRET KIN+CKTOT+MB+TROPI)     Status: Normal   Collection Time   01/12/12  3:39 AM      Component Value Range Comment   Total CK 34  7 - 232 U/L    CK, MB 2.0  0.3 - 4.0 ng/mL    Troponin I <0.30  <0.30 ng/mL    Relative Index RELATIVE INDEX IS INVALID  0.0 - 2.5   LACTIC ACID, PLASMA     Status: Normal   Collection Time   01/12/12  3:39 AM      Component Value Range Comment   Lactic Acid, Venous 1.4  0.5 - 2.2 mmol/L   PROCALCITONIN     Status: Normal   Collection Time   01/12/12  3:39 AM      Component Value Range Comment   Procalcitonin 22.14     BLOOD GAS, ARTERIAL  Status: Abnormal   Collection Time   01/12/12  9:00 AM      Component Value Range Comment   O2 Content 2.0      Delivery systems NASAL CANNULA      pH, Arterial 7.419  7.350 - 7.450    pCO2 arterial 27.7 (*) 35.0 - 45.0 mmHg    pO2, Arterial 91.2  80.0 - 100.0 mmHg    Bicarbonate 17.6 (*) 20.0 - 24.0 mEq/L    TCO2 16.7  0 - 100 mmol/L    Acid-base deficit 5.7 (*) 0.0 - 2.0 mmol/L    O2 Saturation 98.9      Patient temperature 98.6      Collection site RIGHT RADIAL      Drawn by COLLECTED BY RT      Sample type ARTERIAL DRAW      Allens test (pass/fail) PASS  PASS   COMPREHENSIVE METABOLIC PANEL     Status: Abnormal    Collection Time   01/13/12  3:27 AM      Component Value Range Comment   Sodium 134 (*) 135 - 145 mEq/L    Potassium 3.6  3.5 - 5.1 mEq/L    Chloride 105  96 - 112 mEq/L    CO2 20  19 - 32 mEq/L    Glucose, Bld 107 (*) 70 - 99 mg/dL    BUN 58 (*) 6 - 23 mg/dL    Creatinine, Ser 1.61 (*) 0.50 - 1.35 mg/dL    Calcium 8.7  8.4 - 09.6 mg/dL    Total Protein 6.2  6.0 - 8.3 g/dL    Albumin 1.8 (*) 3.5 - 5.2 g/dL    AST 16  0 - 37 U/L    ALT 12  0 - 53 U/L    Alkaline Phosphatase 81  39 - 117 U/L    Total Bilirubin 0.3  0.3 - 1.2 mg/dL    GFR calc non Af Amer 23 (*) >90 mL/min    GFR calc Af Amer 27 (*) >90 mL/min   LACTIC ACID, PLASMA     Status: Normal   Collection Time   01/13/12  3:27 AM      Component Value Range Comment   Lactic Acid, Venous 0.9  0.5 - 2.2 mmol/L   PROCALCITONIN     Status: Normal   Collection Time   01/13/12  3:27 AM      Component Value Range Comment   Procalcitonin 11.49     CBC WITH DIFFERENTIAL     Status: Abnormal   Collection Time   01/13/12  3:27 AM      Component Value Range Comment   WBC 15.6 (*) 4.0 - 10.5 K/uL    RBC 2.39 (*) 4.22 - 5.81 MIL/uL    Hemoglobin 7.5 (*) 13.0 - 17.0 g/dL    HCT 04.5 (*) 40.9 - 52.0 %    MCV 92.9  78.0 - 100.0 fL    MCH 31.4  26.0 - 34.0 pg    MCHC 33.8  30.0 - 36.0 g/dL    RDW 81.1 (*) 91.4 - 15.5 %    Platelets 193  150 - 400 K/uL    Neutrophils Relative 93 (*) 43 - 77 %    Neutro Abs 14.5 (*) 1.7 - 7.7 K/uL    Lymphocytes Relative 2 (*) 12 - 46 %    Lymphs Abs 0.4 (*) 0.7 - 4.0 K/uL    Monocytes Relative 4  3 - 12 %  Monocytes Absolute 0.7  0.1 - 1.0 K/uL    Eosinophils Relative 0  0 - 5 %    Eosinophils Absolute 0.1  0.0 - 0.7 K/uL    Basophils Relative 0  0 - 1 %    Basophils Absolute 0.0  0.0 - 0.1 K/uL       Component Value Date/Time   SDES BLOOD RIGHT HAND  3 ML IN [facility] BOTTLE 01/11/2012 1536   SPECREQUEST NONE 01/11/2012 1536   CULT  Value: STAPHYLOCOCCUS AUREUS Note: RIFAMPIN AND GENTAMICIN SHOULD NOT BE  USED AS SINGLE DRUGS FOR TREATMENT OF STAPH INFECTIONS. Note: Gram Stain Report Called to,Read Back By and Verified With: CONRAD WHITROW 01/12/12 1050 BY SMITHERSJ 01/11/2012 1536   REPTSTATUS PENDING 01/11/2012 1536   US Renal  01/12/2012  *RADIOLOGY REPORT*  Clinical Data: 78 year old male with hematuria, UTI, acute renal failure.  RENAL/URINARY TRACT ULTRASOUND COMPLETE  Comparison:  None.  Findings:  Right Kidney:  Hydronephrosis.  Evidence of echogenic debris in the right renal pelvis and proximal ureter.  Hydroureter is evident. Renal length 11.5 cm.  No focal right renal lesion is identified.  Left Kidney:  Hydronephrosis, moderate to severe in greater than that on the right.  No debris evident in the left renal collecting system.  Hydroureter is evident.  There is a superimposed simple appearing exophytic renal cyst measuring up to 48 mm diameter. Renal length is 12.9 cm.  Bladder:  At least partially decompressed, and a Foley catheter balloon is partially visible.  However, to complex appearing cystic structures are noted adjacent to the bladder.  These might be bladder diverticula containing debris, uncertain.  The bladder also has a heterogeneous appearance.  Furthermore there is a linear echogenic structure partially visible which might represent a stent, uncertain.  IMPRESSION: Left greater than right bilateral obstructive uropathy.  The obstructing etiology is not identified, but the bladder is very heterogeneous such that an obstructing bladder mass is not excluded. There is evidence of echogenic debris in the obstructed right side system, which may indicate pyonephrosis.  Additionally, there may be bladder diverticula containing debris. Overall, recommend follow-up CT abdomen and pelvis (can be done without contrast if necessary) to better characterize.  Original Report Authenticated By: Harley Hallmark, M.D.   Dg Chest Port 1 View  01/12/2012  *RADIOLOGY REPORT*  Clinical Data: T kidney.  PORTABLE  CHEST - 1 VIEW  Comparison: 01/11/2012.  Findings: Mild pulmonary vascular congestion.  Slight increased markings lung bases may represent atelectasis.  Subtle infiltrate not entirely excluded.  Hiatal hernia.  Heart size top normal. Minimally tortuous aorta.  No pneumothorax.  IMPRESSION: Pulmonary vascular congestion.  Slight increased markings lung bases as noted above.  Hiatal hernia.  Original Report Authenticated By: Fuller Canada, M.D.   Dg Chest Port 1 View  01/11/2012  *RADIOLOGY REPORT*  Clinical Data: Weakness.  Shortness of breath.  PORTABLE CHEST - 1 VIEW  Comparison: None.  Findings: Enlarged cardiac silhouette.  Moderately large hiatal hernia.  Clear lungs with normal vascularity.  Bilateral acromioclavicular joint degenerative changes.  IMPRESSION:  1.  Cardiomegaly. 2.  Moderately large hiatal hernia.  Original Report Authenticated By: Darrol Angel, M.D.   Recent Results (from the past 240 hour(s))  URINE CULTURE     Status: Normal (Preliminary result)   Collection Time   01/11/12 11:47 AM      Component Value Range Status Comment   Specimen Description URINE, CATHETERIZED   Final    Special  Requests NONE   Final    Culture  Setup Time 01/11/2012 16:43   Final    Colony Count >=100,000 COLONIES/ML   Final    Culture     Final    Value: STAPHYLOCOCCUS AUREUS     Note: RIFAMPIN AND GENTAMICIN SHOULD NOT BE USED AS SINGLE DRUGS FOR TREATMENT OF STAPH INFECTIONS.   Report Status PENDING   Incomplete   CULTURE, BLOOD (ROUTINE X 2)     Status: Normal (Preliminary result)   Collection Time   01/11/12  3:03 PM      Component Value Range Status Comment   Specimen Description BLOOD RIGHT ARM  5 ML IN [facility] BOTTLE   Final    Special Requests NONE   Final    Culture  Setup Time 01/11/2012 22:25   Final    Culture     Final    Value: STAPHYLOCOCCUS AUREUS     Note: Gram Stain Report Called to,Read Back By and Verified With: SHEILA MAIN @ 1746 ON 01/12/12 BY GOLLD   Report Status PENDING    Incomplete   CULTURE, BLOOD (ROUTINE X 2)     Status: Normal (Preliminary result)   Collection Time   01/11/12  3:36 PM      Component Value Range Status Comment   Specimen Description BLOOD RIGHT HAND  3 ML IN [facility] BOTTLE   Final    Special Requests NONE   Final    Culture  Setup Time 01/11/2012 22:25   Final    Culture     Final    Value: STAPHYLOCOCCUS AUREUS     Note: RIFAMPIN AND GENTAMICIN SHOULD NOT BE USED AS SINGLE DRUGS FOR TREATMENT OF STAPH INFECTIONS.     Note: Gram Stain Report Called to,Read Back By and Verified With: CONRAD WHITROW 01/12/12 1050 BY SMITHERSJ   Report Status PENDING   Incomplete   MRSA PCR SCREENING     Status: Abnormal   Collection Time   01/11/12  4:37 PM      Component Value Range Status Comment   MRSA by PCR POSITIVE (*) NEGATIVE Final       01/13/2012, 11:16 AM     LOS: 2 days

## 2012-01-13 NOTE — Progress Notes (Signed)
Subjective: Patient reports : Suprapubic pain.    Objective: Vital signs in last 24 hours: Temp:  [97.8 F (36.6 C)-98.9 F (37.2 C)] 98.3 F (36.8 C) (07/09 1600) Pulse Rate:  [69-98] 81  (07/09 1600) Resp:  [17-28] 25  (07/09 1600) BP: (105-133)/(44-69) 120/54 mmHg (07/09 1600) SpO2:  [93 %-98 %] 97 % (07/09 1600) Weight:  [179 lb 7.3 oz (81.4 kg)] 179 lb 7.3 oz (81.4 kg) (07/09 0000)  Intake/Output from previous day: 07/08 0701 - 07/09 0700 In: 3265 [I.V.:2875; IV Piggyback:360] Out: 2335 [Urine:2335] Intake/Output this shift: Total I/O In: 120 [Other:120] Out: 350 [Urine:350]  Physical Exam:  General: C/o suprapubic pain. No urinary output since 1 PM.  Bladder scan: 600 ml Lungs - Normal respiratory effort, chest expands symmetrically.  Abdomen - Soft, non-tender & non-distended Foley not draining.  Catheter appears not to be in bladder. I deflated the balloon and pushed the catheter back in the bladder. About 600 ml of urine drained very quickly out of the bladder with relief of S/P pain. The balloon of the catheter was then inflated with 20 cc of water.  Lab Results:  Basename 01/13/12 0327 01/12/12 0339 01/11/12 1159  HGB 7.5* 8.3* 9.2*  HCT 22.2* 24.2* 27.2*   BMET  Basename 01/13/12 0327 01/12/12 0339  NA 134* 129*  K 3.6 3.7  CL 105 99  CO2 20 18*  GLUCOSE 107* 129*  BUN 58* 66*  CREATININE 2.36* 3.14*  CALCIUM 8.7 8.7    Basename 01/11/12 1159  LABPT --  INR 1.29   No results found for this basename: LABURIN:1 in the last 72 hours Results for orders placed during the hospital encounter of 01/11/12  URINE CULTURE     Status: Normal (Preliminary result)   Collection Time   01/11/12 11:47 AM      Component Value Range Status Comment   Specimen Description URINE, CATHETERIZED   Final    Special Requests NONE   Final    Culture  Setup Time 01/11/2012 16:43   Final    Colony Count >=100,000 COLONIES/ML   Final    Culture     Final    Value:  STAPHYLOCOCCUS AUREUS     Note: RIFAMPIN AND GENTAMICIN SHOULD NOT BE USED AS SINGLE DRUGS FOR TREATMENT OF STAPH INFECTIONS.   Report Status PENDING   Incomplete   CULTURE, BLOOD (ROUTINE X 2)     Status: Normal (Preliminary result)   Collection Time   01/11/12  3:03 PM      Component Value Range Status Comment   Specimen Description BLOOD RIGHT ARM  5 ML IN [facility] BOTTLE   Final    Special Requests NONE   Final    Culture  Setup Time 01/11/2012 22:25   Final    Culture     Final    Value: STAPHYLOCOCCUS AUREUS     Note: Gram Stain Report Called to,Read Back By and Verified With: SHEILA MAIN @ 1746 ON 01/12/12 BY GOLLD   Report Status PENDING   Incomplete   CULTURE, BLOOD (ROUTINE X 2)     Status: Normal (Preliminary result)   Collection Time   01/11/12  3:36 PM      Component Value Range Status Comment   Specimen Description BLOOD RIGHT HAND  3 ML IN [facility] BOTTLE   Final    Special Requests NONE   Final    Culture  Setup Time 01/11/2012 22:25   Final    Culture  Final    Value: METHICILLIN RESISTANT STAPHYLOCOCCUS AUREUS     Note: RIFAMPIN AND GENTAMICIN SHOULD NOT BE USED AS SINGLE DRUGS FOR TREATMENT OF STAPH INFECTIONS. CRITICAL RESULT CALLED TO, READ BACK BY AND VERIFIED WITH: PAM WEST BY INGRAM A 7/9 2P     Note: Gram Stain Report Called to,Read Back By and Verified With: CONRAD WHITROW 01/12/12 1050 BY SMITHERSJ   Report Status PENDING   Incomplete   MRSA PCR SCREENING     Status: Abnormal   Collection Time   01/11/12  4:37 PM      Component Value Range Status Comment   MRSA by PCR POSITIVE (*) NEGATIVE Final     Studies/Results: US Renal  01/12/2012  *RADIOLOGY REPORT*  Clinical Data: 78 year old male with hematuria, UTI, acute renal failure.  RENAL/URINARY TRACT ULTRASOUND COMPLETE  Comparison:  None.  Findings:  Right Kidney:  Hydronephrosis.  Evidence of echogenic debris in the right renal pelvis and proximal ureter.  Hydroureter is evident. Renal length 11.5 cm.  No focal right  renal lesion is identified.  Left Kidney:  Hydronephrosis, moderate to severe in greater than that on the right.  No debris evident in the left renal collecting system.  Hydroureter is evident.  There is a superimposed simple appearing exophytic renal cyst measuring up to 48 mm diameter. Renal length is 12.9 cm.  Bladder:  At least partially decompressed, and a Foley catheter balloon is partially visible.  However, to complex appearing cystic structures are noted adjacent to the bladder.  These might be bladder diverticula containing debris, uncertain.  The bladder also has a heterogeneous appearance.  Furthermore there is a linear echogenic structure partially visible which might represent a stent, uncertain.  IMPRESSION: Left greater than right bilateral obstructive uropathy.  The obstructing etiology is not identified, but the bladder is very heterogeneous such that an obstructing bladder mass is not excluded. There is evidence of echogenic debris in the obstructed right side system, which may indicate pyonephrosis.  Additionally, there may be bladder diverticula containing debris. Overall, recommend follow-up CT abdomen and pelvis (can be done without contrast if necessary) to better characterize.  Original Report Authenticated By: Harley Hallmark, M.D.   Dg Chest Port 1 View  01/12/2012  *RADIOLOGY REPORT*  Clinical Data: T kidney.  PORTABLE CHEST - 1 VIEW  Comparison: 01/11/2012.  Findings: Mild pulmonary vascular congestion.  Slight increased markings lung bases may represent atelectasis.  Subtle infiltrate not entirely excluded.  Hiatal hernia.  Heart size top normal. Minimally tortuous aorta.  No pneumothorax.  IMPRESSION: Pulmonary vascular congestion.  Slight increased markings lung bases as noted above.  Hiatal hernia.  Original Report Authenticated By: Fuller Canada, M.D.    Assessment/Plan:  Gross hematuria.  Urinary retention.  BPH.  Bilateral hydronephrosis. Renal insufficiency.  Leave  Foley indwelling  Irrigate catheter PRN.   LOS: 2 days   Melizza Kanode-HENRY 01/13/2012, 5:42 PM

## 2012-01-13 NOTE — Evaluation (Signed)
Occupational Therapy Evaluation Patient Details Name: Jack Stevens MRN: [id number] DOB: 1922/03/23 Today's Date: 01/13/2012 Time: 5784-6962 OT Time Calculation (min): 30 min  OT Assessment / Plan / Recommendation Clinical Impression  This 78 year old man was admitted with sepsis.  he has h/o seizures, depression, anxiety, PTSD and chronic kidney disease.  He now needs assist x 2 for LB mobility and transfers.  He is appropriate for skilled OT to decrease burden of care at next venue.      OT Assessment  Patient needs continued OT Services    Follow Up Recommendations  Skilled nursing facility    Barriers to Discharge      Equipment Recommendations  Defer to next venue    Recommendations for Other Services    Frequency  Min 1X/week    Precautions / Restrictions Precautions Precautions: Fall Restrictions Weight Bearing Restrictions: No   Pertinent Vitals/Pain Pt had slight headache--premedicated.  0n 2 liters:  sats 95%, HR 87, BP 125/65    ADL  Eating/Feeding: Performed;Set up Where Assessed - Eating/Feeding: Chair Grooming: Performed;Wash/dry face;Set up Where Assessed - Grooming: Supine, head of bed up Upper Body Bathing: Simulated;Supervision/safety Where Assessed - Upper Body Bathing: Supported sitting Lower Body Bathing: Simulated;+2 Total assistance Lower Body Bathing: Patient Percentage: 30% Where Assessed - Lower Body Bathing: Supported sit to stand Upper Body Dressing: Simulated;Minimal assistance Where Assessed - Upper Body Dressing: Supported sitting Lower Body Dressing: +2 Total assistance Lower Body Dressing: Patient Percentage: 0% Where Assessed - Lower Body Dressing: Supported sit to Pharmacist, hospital: Chief of Staff: Patient Percentage: 60% Statistician Method: Surveyor, minerals:  (bed to recliner) Toileting - Architect and Hygiene: Simulated;+2 Total assistance Toileting -  Architect and Hygiene: Patient Percentage: 20% Where Assessed - Toileting Clothing Manipulation and Hygiene: Sit to stand from 3-in-1 or toilet Transfers/Ambulation Related to ADLs: SPT only with RW    OT Diagnosis: Generalized weakness  OT Problem List: Decreased strength;Decreased activity tolerance;Decreased cognition;Impaired balance (sitting and/or standing);Decreased knowledge of use of DME or AE OT Treatment Interventions: Self-care/ADL training;DME and/or AE instruction;Therapeutic activities;Patient/family education;Balance training;Cognitive remediation/compensation   OT Goals Acute Rehab OT Goals OT Goal Formulation: With patient Time For Goal Achievement: 01/27/12 Potential to Achieve Goals: Good ADL Goals Pt Will Perform Grooming: Standing at sink;with min assist ADL Goal: Grooming - Progress: Goal set today Pt Will Perform Lower Body Bathing: with mod assist;Sit to stand from chair ADL Goal: Lower Body Bathing - Progress: Goal set today Pt Will Transfer to Toilet: with min assist;Ambulation;3-in-1 ADL Goal: Toilet Transfer - Progress: Goal set today Additional ADL Goal #2: Pt will perform 2 sets 10 AROM to L shoulder to increase strength for adls ADL Goal: Additional Goal #2 - Progress: Goal set today  Visit Information  Last OT Received On: 01/13/12 Assistance Needed: +2 PT/OT Co-Evaluation/Treatment: Yes    Subjective Data  Patient Stated Goal: none stated.  Agreeable to OT/Pt   Prior Functioning  Home Living Lives With:  (facility) Available Help at Discharge: Skilled Nursing Facility Prior Function Level of Independence:  (unknown) Comments: Pt's level of function PTA unknown. Pt is unable to provide. Communication Communication: No difficulties Dominant Hand: Right    Cognition  Overall Cognitive Status: Impaired Area of Impairment: Attention;Memory;Following commands Arousal/Alertness: Lethargic Orientation Level: Disoriented  to;Place;Time;Situation Behavior During Session: Flat affect Attention - Other Comments: pt did stay aroused forobility and started feeding himself the cereal Following Commands: Follows one step commands with  increased time    Extremity/Trunk Assessment Right Upper Extremity Assessment RUE ROM/Strength/Tone: Within functional levels (to 90 degrees) Left Upper Extremity Assessment LUE ROM/Strength/Tone: Deficits LUE ROM/Strength/Tone Deficits: could not initiate lifting LUE initially, but able to after AAROM to 90 degrees Right Lower Extremity Assessment RLE ROM/Strength/Tone: Deficits RLE ROM/Strength/Tone Deficits: strength at least 4/5, bears weight for transfer, ROM grossly WFL Left Lower Extremity Assessment LLE ROM/Strength/Tone: Deficits LLE ROM/Strength/Tone Deficits: strength at least 4/5, able to bear weight for transfer Trunk Assessment Trunk Assessment: Kyphotic   Mobility Bed Mobility Bed Mobility: Supine to Sit;Sitting - Scoot to Edge of Bed Supine to Sit: 1: +2 Total assist;HOB elevated Supine to Sit: Patient Percentage: 40% Sitting - Scoot to Edge of Bed: 1: +2 Total assist Sitting - Scoot to Edge of Bed: Patient Percentage: 30% Details for Bed Mobility Assistance: pt requires extra time and multi modal cues for activity completion Transfers Transfers: Sit to Stand Sit to Stand: 1: +2 Total assist Sit to Stand: Patient Percentage: 60% Details for Transfer Assistance: pt was able to stand at [facility] then take several small steps to turn to recliner.   Exercise    Balance Static Sitting Balance Static Sitting - Balance Support: Bilateral upper extremity supported Static Sitting - Level of Assistance: 4: Min assist  End of Session OT - End of Session Equipment Utilized During Treatment: Gait belt (rw) Activity Tolerance: Patient limited by fatigue Patient left: in chair;with call bell/phone within reach Nurse Communication: Mobility status  GO      Jack Stevens 01/13/2012, 10:02 AM Jack Stevens, OTR/L [phone] 01/13/2012

## 2012-01-13 NOTE — Progress Notes (Signed)
Subjective: Patient states shortness of breath is improved. Patient's RR and HR improved. Hematuria clearing up. Patient denies any chest pain. Patient sitting in chair eating breakfast.  Objective: Vital signs in last 24 hours: Filed Vitals:   01/13/12 0200 01/13/12 0400 01/13/12 0600 01/13/12 0800  BP: 109/56 110/57 117/57   Pulse: 77 75 79   Temp:  98.9 F (37.2 C)  98.4 F (36.9 C)  TempSrc:  Oral  Oral  Resp: 17 18 24    Height:      Weight:      SpO2: 96% 96% 94%     Intake/Output Summary (Last 24 hours) at 01/13/12 0908 Last data filed at 01/13/12 0600  Gross per 24 hour  Intake   3015 ml  Output   2060 ml  Net    955 ml    Weight change: 2.7 kg (5 lb 15.2 oz)  General: Alert, awake, oriented to self and place, in no acute distress. HEENT: No bruits, no goiter. Heart: RRR, without murmurs, rubs, gallops. Lungs: Clear to auscultation bilaterally in anterior lung fields. Abdomen: Soft, nontender, nondistended, positive bowel sounds. Extremities: No clubbing cyanosis or edema with positive pedal pulses. Neuro: Grossly intact, nonfocal.    Lab Results:  Basename 01/13/12 0327 01/12/12 0339 01/11/12 1159  NA 134* 129* --  K 3.6 3.7 --  CL 105 99 --  CO2 20 18* --  GLUCOSE 107* 129* --  BUN 58* 66* --  CREATININE 2.36* 3.14* --  CALCIUM 8.7 8.7 --  MG -- -- 1.9  PHOS -- -- --    Basename 01/13/12 0327 01/12/12 0339  AST 16 13  ALT 12 9  ALKPHOS 81 63  BILITOT 0.3 0.2*  PROT 6.2 6.6  ALBUMIN 1.8* 2.1*   No results found for this basename: LIPASE:2,AMYLASE:2 in the last 72 hours  Basename 01/13/12 0327 01/12/12 0339  WBC 15.6* 17.1*  NEUTROABS 14.5* 16.3*  HGB 7.5* 8.3*  HCT 22.2* 24.2*  MCV 92.9 91.7  PLT 193 241    Basename 01/12/12 0339 01/11/12 1947 01/11/12 1159  CKTOTAL 34 53 60  CKMB 2.0 2.2 3.2  CKMBINDEX -- -- --  TROPONINI <0.30 <0.30 <0.30   No components found with this basename: POCBNP:3 No results found for this basename:  DDIMER:2 in the last 72 hours No results found for this basename: HGBA1C:2 in the last 72 hours No results found for this basename: CHOL:2,HDL:2,LDLCALC:2,TRIG:2,CHOLHDL:2,LDLDIRECT:2 in the last 72 hours  Basename 01/11/12 1159  TSH 1.354  T4TOTAL --  T3FREE --  THYROIDAB --    Basename 01/11/12 1503  VITAMINB12 373  FOLATE 19.3  FERRITIN 165  TIBC Not calculated due to Iron <10.  IRON <10*  RETICCTPCT --    Micro Results: Recent Results (from the past 240 hour(s))  URINE CULTURE     Status: Normal (Preliminary result)   Collection Time   01/11/12 11:47 AM      Component Value Range Status Comment   Specimen Description URINE, CATHETERIZED   Final    Special Requests NONE   Final    Culture  Setup Time 01/11/2012 16:43   Final    Colony Count >=100,000 COLONIES/ML   Final    Culture     Final    Value: STAPHYLOCOCCUS AUREUS     Note: RIFAMPIN AND GENTAMICIN SHOULD NOT BE USED AS SINGLE DRUGS FOR TREATMENT OF STAPH INFECTIONS.   Report Status PENDING   Incomplete   CULTURE, BLOOD (ROUTINE X 2)  Status: Normal (Preliminary result)   Collection Time   01/11/12  3:03 PM      Component Value Range Status Comment   Specimen Description BLOOD RIGHT ARM  5 ML IN [facility] BOTTLE   Final    Special Requests NONE   Final    Culture  Setup Time 01/11/2012 22:25   Final    Culture     Final    Value: STAPHYLOCOCCUS AUREUS     Note: Gram Stain Report Called to,Read Back By and Verified With: SHEILA MAIN @ 1746 ON 01/12/12 BY GOLLD   Report Status PENDING   Incomplete   CULTURE, BLOOD (ROUTINE X 2)     Status: Normal (Preliminary result)   Collection Time   01/11/12  3:36 PM      Component Value Range Status Comment   Specimen Description BLOOD RIGHT HAND  3 ML IN [facility] BOTTLE   Final    Special Requests NONE   Final    Culture  Setup Time 01/11/2012 22:25   Final    Culture     Final    Value: STAPHYLOCOCCUS AUREUS     Note: RIFAMPIN AND GENTAMICIN SHOULD NOT BE USED AS SINGLE DRUGS  FOR TREATMENT OF STAPH INFECTIONS.     Note: Gram Stain Report Called to,Read Back By and Verified With: CONRAD WHITROW 01/12/12 1050 BY SMITHERSJ   Report Status PENDING   Incomplete   MRSA PCR SCREENING     Status: Abnormal   Collection Time   01/11/12  4:37 PM      Component Value Range Status Comment   MRSA by PCR POSITIVE (*) NEGATIVE Final     Studies/Results: US Renal  01/12/2012  *RADIOLOGY REPORT*  Clinical Data: 78 year old male with hematuria, UTI, acute renal failure.  RENAL/URINARY TRACT ULTRASOUND COMPLETE  Comparison:  None.  Findings:  Right Kidney:  Hydronephrosis.  Evidence of echogenic debris in the right renal pelvis and proximal ureter.  Hydroureter is evident. Renal length 11.5 cm.  No focal right renal lesion is identified.  Left Kidney:  Hydronephrosis, moderate to severe in greater than that on the right.  No debris evident in the left renal collecting system.  Hydroureter is evident.  There is a superimposed simple appearing exophytic renal cyst measuring up to 48 mm diameter. Renal length is 12.9 cm.  Bladder:  At least partially decompressed, and a Foley catheter balloon is partially visible.  However, to complex appearing cystic structures are noted adjacent to the bladder.  These might be bladder diverticula containing debris, uncertain.  The bladder also has a heterogeneous appearance.  Furthermore there is a linear echogenic structure partially visible which might represent a stent, uncertain.  IMPRESSION: Left greater than right bilateral obstructive uropathy.  The obstructing etiology is not identified, but the bladder is very heterogeneous such that an obstructing bladder mass is not excluded. There is evidence of echogenic debris in the obstructed right side system, which may indicate pyonephrosis.  Additionally, there may be bladder diverticula containing debris. Overall, recommend follow-up CT abdomen and pelvis (can be done without contrast if necessary) to better  characterize.  Original Report Authenticated By: Harley Hallmark, M.D.   Dg Chest Port 1 View  01/12/2012  *RADIOLOGY REPORT*  Clinical Data: T kidney.  PORTABLE CHEST - 1 VIEW  Comparison: 01/11/2012.  Findings: Mild pulmonary vascular congestion.  Slight increased markings lung bases may represent atelectasis.  Subtle infiltrate not entirely excluded.  Hiatal hernia.  Heart size top  normal. Minimally tortuous aorta.  No pneumothorax.  IMPRESSION: Pulmonary vascular congestion.  Slight increased markings lung bases as noted above.  Hiatal hernia.  Original Report Authenticated By: Fuller Canada, M.D.   Dg Chest Port 1 View  01/11/2012  *RADIOLOGY REPORT*  Clinical Data: Weakness.  Shortness of breath.  PORTABLE CHEST - 1 VIEW  Comparison: None.  Findings: Enlarged cardiac silhouette.  Moderately large hiatal hernia.  Clear lungs with normal vascularity.  Bilateral acromioclavicular joint degenerative changes.  IMPRESSION:  1.  Cardiomegaly. 2.  Moderately large hiatal hernia.  Original Report Authenticated By: Darrol Angel, M.D.    Medications:     . aspirin  81 mg Oral Daily  . Chlorhexidine Gluconate Cloth  6 each Topical Q0600  . diltiazem  120 mg Oral Daily  . dutasteride  0.5 mg Oral Daily  . metoprolol tartrate  25 mg Oral BID  . mupirocin ointment  1 application Nasal BID  . pantoprazole (PROTONIX) IV  40 mg Intravenous Q24H  . piperacillin-tazobactam (ZOSYN)  IV  2.25 g Intravenous Q6H  . sertraline  150 mg Oral Daily  . sodium chloride  3 mL Intravenous Q12H  . Tamsulosin HCl  0.4 mg Oral QPC supper  . vancomycin  1,000 mg Intravenous Q48H  . DISCONTD: nicotine  14 mg Transdermal Daily  . DISCONTD: piperacillin-tazobactam (ZOSYN)  IV  2.25 g Intravenous Q8H    Assessment: Principal Problem:  *Sepsis Active Problems:  Acute on chronic kidney disease, stage 3  UTI (lower urinary tract infection)  Dehydration  EKG abnormalities  Hyponatremia  Weakness generalized   Leukocytosis  BPH (benign prostatic hyperplasia)  Anemia  Acute respiratory distress  Hematuria  HTN (hypertension), LVH by volts  Bacteremia due to Staphylococcus aureus   Plan: #1 sepsis Likely secondary to UTI and probable bacteremia. Patient's tachycardia has improved on rate limiting medications and antibiotics. Respiratory rate is also improved. WBC is trending down and now at 15.6 from 17.1 from 21.7 on admission. Lactic acid level is trending down and 0.9 from  1.4  from 2.7. Pro calcitonin is trending down at 11.49 from 22.14 from 33.30. Will decrease patient's IV fluids to 100 cc per hour. Urine cultures are pending and preliminary consistent with staph aureus. Blood cultures are pending and preliminary findings consistent with staph aureus in 2 out of 2 blood cultures.. Continue empiric IV Zosyn. IV vancomycin started yesterday. Follow.  #2 acute respiratory distress Likely secondary to problem #1.  ABG with a pH of 7.4 PCO2 of 27 PO2 of 91 bicarbonate of 18 from 01/12/2012. Tachycardia PNA has improved. Lactic acid and pro calcitonin levels are trending down. Blood cultures and urine cultures are pending and consistent with staph aureus. Continue empiric IV Zosyn. IV vancomycin started yesterday. PCCM following and appreciate input and recommendations.   #3 Probable  Bacteremia Blood cultures preliminary results growing staph aureus 2 out of 2. Likely seeded from the urine. Sensitivities are pending. Patient is afebrile. White count is trending down. Patient was started on empiric IV vancomycin yesterday. Continue vancomycin day #2.  #4 UTI Urine cultures are pending with preliminary results showing staph aureus.. Continue empiric IV Zosyn. IV vancomycin started yesterday. Follow.  #5 gross hematuria Questionable etiology. Renal ultrasound consistent with bilateral hydronephrosis with a very heterogeneous bladder concerning for an obstructive bladder mass. Hematuria is slowly  improving urine is clearing up and now P.-colored.. Continue indwelling Foley catheter and irrigate as needed. ?? CT of the abdomen and  pelvis without contrast for further delineation however will defer to urology. Urology is following a recommended cystoscopy when patient is medically stable. Per urology.  #6 acute on chronic kidney disease stage III Patient on discharge from [place] on 01/06/2012 and a creatinine of 1.29. Admission creatinine was 3.95. Secondary to post renal azotemia likely secondary to obstruction from BPH in the setting of volume depletion and dehydration and sepsis. Patient is not on any nephrotoxins. Renal function is slowly trending down. Creatinine today is 2.36 from 3.14 from 3.95. Renal ultrasound is consistent with bilateral hydronephrosis. Decrease IV fluids to 100 cc per hour. Follow. Urology is following and appreciate input and recommendations.   #7 dehydration IV fluids  #8 EKG changes Patient with shortness of breath however denies any chest pain. Cardiac enzymes are negative x3. 2-D echo with EF of 70-75%. No wall motion abnormalities. Repeat EKG with T wave inversion in leads 1 aVL V4 through V6. Aspirin started per cardiology will need to monitor closely with patient's hematuria. Patient also started on diltiazem and metoprolol. Per cardiology patient will need ischemic evaluation once medically stable with probable initial Myoview scan. Cardiology is following and appreciate input and recommendations.  #9 BPH Foley catheter. Per urology. Continue Avodart, Flomax. Patient will likely need a cystoscopy once medically stable.  #10 hyponatremia Likely secondary to volume depletion. TSH is within normal limits. Chest x-ray yesterday was negative. Improving with hydration.  #11 depression/anxiety Continue home regimen of Zoloft and Xanax.  #12 hypertension Patient started on metoprolol and diltiazem per cardiology. Follow.  #13 severe iron deficiency  anemia/acute blood loss anemia H&H is trending down likely secondary to patient's hematuria in the setting of sepsis. Hemoglobin now is at 7.5 from 9.2. Hematuria is slowly clearing up. Transfusion threshold hemoglobin less than 7. Once patient gets over this hospitalization if he has not had a colonoscopy done may benefit from 1. May need iron supplements on discharge.  #14 prophylaxis PPI for GI prophylaxis. SCDs for DVT prophylaxis.  Called and spoke with and updated patient's daughter [name] [phone])     LOS: 2 days   Newman Waren [phone] 01/13/2012, 9:08 AM

## 2012-01-13 NOTE — Evaluation (Signed)
Physical Therapy Evaluation Patient Details Name: Jack Stevens MRN: [id number] DOB: April 30, 1922 Today's Date: 01/13/2012 Time: 3086-5784 PT Time Calculation (min): 29 min  PT Assessment / Plan / Recommendation Clinical Impression  Pt admitted for urinary complications. Admitted from SNF. pt was able to participate in mobility evaluation and will benefit from PT while in hospital to improve functional mobility  ,strength to DC to next venue.    PT Assessment  Patient needs continued PT services    Follow Up Recommendations    SNF   Barriers to Discharge        Equipment Recommendations  Defer to next venue    Recommendations for Other Services     Frequency Min 3X/week    Precautions / Restrictions Precautions Precautions: Fall Restrictions Weight Bearing Restrictions: No   Pertinent Vitals/Pain C/o HA, Has been medicated Pre VS: 76  94% 2 l 27 rr Post: 104  95% 2 l 31rr      Mobility  Bed Mobility Bed Mobility: Supine to Sit;Sitting - Scoot to Edge of Bed Supine to Sit: 1: +2 Total assist;HOB elevated Supine to Sit: Patient Percentage: 40% Sitting - Scoot to Edge of Bed: 1: +2 Total assist Sitting - Scoot to Edge of Bed: Patient Percentage: 30% Details for Bed Mobility Assistance: pt requires extra time and multi modal cues for activity completion Transfers Transfers: Sit to Stand;Stand to [employer] Pivot Transfers Stand Pivot Transfers: 1: +2 Total assist;From elevated surface Stand Pivot Transfers: Patient Percentage: 60% Details for Transfer Assistance: pt was able to stand at [facility] then take several small steps to turn to recliner. Ambulation/Gait Assistive device: Rolling walker    Exercises     PT Diagnosis:    PT Problem List: Decreased strength;Decreased activity tolerance;Decreased knowledge of precautions;Decreased safety awareness;Decreased knowledge of use of DME;Pain;Decreased cognition PT Treatment Interventions: DME instruction;Gait  training;Functional mobility training;Patient/family education;Therapeutic activities   PT Goals Acute Rehab PT Goals PT Goal Formulation: Patient unable to participate in goal setting Time For Goal Achievement: 01/27/12 Potential to Achieve Goals: Fair Pt will go Supine/Side to Sit: with mod assist;with HOB 0 degrees PT Goal: Supine/Side to Sit - Progress: Goal set today Pt will go Sit to Stand: with mod assist PT Goal: Sit to Stand - Progress: Goal set today Pt will go Stand to Sit: with min assist PT Goal: Stand to Sit - Progress: Goal set today Pt will Transfer Bed to Chair/Chair to Bed: with mod assist PT Transfer Goal: Bed to Chair/Chair to Bed - Progress: Goal set today Pt will Ambulate: 16 - 50 feet;with +2 total assist;with rolling walker PT Goal: Ambulate - Progress: Goal set today  Visit Information  Last PT Received On: 01/13/12 Assistance Needed: +2 PT/OT Co-Evaluation/Treatment: Yes    Subjective Data  Subjective: I have a head ache Patient Stated Goal: Pt is unable to state   Prior Functioning  Home Living Lives With:  (facility) Available Help at Discharge: Skilled Nursing Facility Prior Function Level of Independence:  (unknown) Comments: Pt's level of function PTA unknown. Pt is unable to provide. Communication Communication: No difficulties Dominant Hand: Right    Cognition  Overall Cognitive Status: Impaired Area of Impairment: Attention;Memory;Following commands Arousal/Alertness: Lethargic Orientation Level: Disoriented to;Place;Time;Situation Behavior During Session: Flat affect Attention - Other Comments: pt did stay aroused forobility and started feeding himself the cereal Following Commands: Follows one step commands with increased time    Extremity/Trunk Assessment Right Lower Extremity Assessment RLE ROM/Strength/Tone: Deficits RLE ROM/Strength/Tone Deficits: strength at  least 4/5, bears weight for transfer, ROM grossly WFL Left Lower  Extremity Assessment LLE ROM/Strength/Tone: Deficits LLE ROM/Strength/Tone Deficits: strength at least 4/5, able to bear weight for transfer   Balance Static Sitting Balance Static Sitting - Balance Support: Bilateral upper extremity supported Static Sitting - Level of Assistance: 4: Min assist  End of Session PT - End of Session Activity Tolerance: Patient limited by fatigue;Patient limited by pain Patient left: in chair;with call bell/phone within reach Nurse Communication: Mobility status;Need for lift equipment  GP     Rada Hay 01/13/2012, 10:02 AM  [phone]

## 2012-01-14 ENCOUNTER — Encounter (HOSPITAL_COMMUNITY): Payer: Self-pay | Admitting: Radiology

## 2012-01-14 ENCOUNTER — Inpatient Hospital Stay (HOSPITAL_COMMUNITY): Payer: Medicare Other

## 2012-01-14 LAB — CULTURE, BLOOD (ROUTINE X 2)

## 2012-01-14 LAB — CBC WITH DIFFERENTIAL/PLATELET
Basophils Absolute: 0 10*3/uL (ref 0.0–0.1)
Basophils Relative: 0 % (ref 0–1)
Eosinophils Absolute: 0.1 10*3/uL (ref 0.0–0.7)
Eosinophils Relative: 1 % (ref 0–5)
HCT: 23.1 % — ABNORMAL LOW (ref 39.0–52.0)
MCH: 30.8 pg (ref 26.0–34.0)
MCHC: 33.3 g/dL (ref 30.0–36.0)
Monocytes Absolute: 0.5 10*3/uL (ref 0.1–1.0)
Monocytes Relative: 5 % (ref 3–12)
Neutro Abs: 9.2 10*3/uL — ABNORMAL HIGH (ref 1.7–7.7)
RDW: 15.3 % (ref 11.5–15.5)

## 2012-01-14 LAB — RENAL FUNCTION PANEL
Calcium: 8.7 mg/dL (ref 8.4–10.5)
Creatinine, Ser: 1.83 mg/dL — ABNORMAL HIGH (ref 0.50–1.35)
GFR calc Af Amer: 37 mL/min — ABNORMAL LOW (ref >90)
GFR calc non Af Amer: 32 mL/min — ABNORMAL LOW (ref >90)
Glucose, Bld: 87 mg/dL (ref 70–99)
Phosphorus: 3.2 mg/dL (ref 2.3–4.6)
Sodium: 137 mEq/L (ref 135–145)

## 2012-01-14 LAB — URINE CULTURE

## 2012-01-14 LAB — PRO B NATRIURETIC PEPTIDE: Pro B Natriuretic peptide (BNP): 8047 pg/mL — ABNORMAL HIGH (ref 0–450)

## 2012-01-14 MED ORDER — FUROSEMIDE 10 MG/ML IJ SOLN
40.0000 mg | Freq: Two times a day (BID) | INTRAMUSCULAR | Status: DC
  Filled 2012-01-14 (×2): qty 4

## 2012-01-14 MED ORDER — POTASSIUM CHLORIDE CRYS ER 20 MEQ PO TBCR
40.0000 meq | EXTENDED_RELEASE_TABLET | Freq: Once | ORAL | Status: AC
  Administered 2012-01-14: 40 meq via ORAL
  Filled 2012-01-14: qty 2

## 2012-01-14 MED ORDER — VANCOMYCIN HCL IN DEXTROSE 1-5 GM/200ML-% IV SOLN
1000.0000 mg | INTRAVENOUS | Status: DC
  Administered 2012-01-15 – 2012-01-19 (×5): 1000 mg via INTRAVENOUS
  Filled 2012-01-14 (×5): qty 200

## 2012-01-14 MED ORDER — FUROSEMIDE 10 MG/ML IJ SOLN
40.0000 mg | Freq: Two times a day (BID) | INTRAMUSCULAR | Status: AC
  Administered 2012-01-14 – 2012-01-15 (×3): 40 mg via INTRAVENOUS
  Filled 2012-01-14 (×3): qty 4

## 2012-01-14 NOTE — Progress Notes (Signed)
Subjective:  Awake and alert but minimally conversant.  Objective:  Vital Signs in the last 24 hours: Temp:  [97.8 F (36.6 C)-99.3 F (37.4 C)] 99.3 F (37.4 C) (07/10 0800) Pulse Rate:  [80-93] 93  (07/10 1014) Resp:  [19-25] 25  (07/09 1600) BP: (111-162)/(52-70) 162/70 mmHg (07/10 1014) SpO2:  [94 %-97 %] 97 % (07/09 1600) Weight:  [80.2 kg (176 lb 12.9 oz)] 80.2 kg (176 lb 12.9 oz) (07/10 0500)  Intake/Output from previous day:  Intake/Output Summary (Last 24 hours) at 01/14/12 1107 Last data filed at 01/14/12 1000  Gross per 24 hour  Intake    420 ml  Output   2275 ml  Net  -1855 ml    Physical Exam: General appearance: alert, cooperative, no distress and slowed mentation Lungs: decreased breath sounds Heart: regular rate and rhythm   Rate: 88  Rhythm: normal sinus rhythm and PACs  Lab Results:  Basename 01/14/12 0322 01/13/12 0327  WBC 10.2 15.6*  HGB 7.7* 7.5*  PLT 203 193    Basename 01/14/12 0322 01/13/12 0327  NA 137 134*  K 3.4* 3.6  CL 109 105  CO2 20 20  GLUCOSE 87 107*  BUN 52* 58*  CREATININE 1.83* 2.36*    Basename 01/12/12 0339 01/11/12 1947  TROPONINI <0.30 <0.30   Hepatic Function Panel  Basename 01/14/12 0322 01/13/12 0327  PROT -- 6.2  ALBUMIN 1.7* --  AST -- 16  ALT -- 12  ALKPHOS -- 81  BILITOT -- 0.3  BILIDIR -- --  IBILI -- --   No results found for this basename: CHOL in the last 72 hours  Basename 01/11/12 1159  INR 1.29    Imaging: Imaging results have been reviewed  Cardiac Studies:  Assessment/Plan:   Principal Problem:  *Sepsis Active Problems:  Acute on chronic kidney disease, stage 3  UTI (lower urinary tract infection)  Dehydration  EKG abnormalities  Acute respiratory distress  Hyponatremia  Weakness generalized  Leukocytosis  BPH (benign prostatic hyperplasia)  Anemia  Hematuria  HTN (hypertension), LVH by volts  Bacteremia due to Staphylococcus aureus  Plan- CXR shows vascular  congestion, ? Diastolic CHF. Will check BNP, MD to see. His rhythm seems to be settling down.    [employer] PA-C 01/14/2012, 11:07 AM

## 2012-01-14 NOTE — Progress Notes (Signed)
ANTIBIOTIC CONSULT NOTE - FOLLOW UP  Pharmacy Consult for Vanco Indication: MRSA Bacteremia and UTI  No Known Allergies  Patient Measurements: Height: 5\' 9"  (175.3 cm) Weight: 176 lb 12.9 oz (80.2 kg) IBW/kg (Calculated) : 70.7   Vital Signs: Temp: 99.3 F (37.4 C) (07/10 0800) Temp src: Oral (07/10 0800) BP: 117/60 mmHg (07/10 1200) Pulse Rate: 80  (07/10 1200) Intake/Output from previous day: 07/09 0701 - 07/10 0700 In: 360 [I.V.:240] Out: 2250 [Urine:2250] Intake/Output from this shift: Total I/O In: 60 [I.V.:60] Out: 375 [Urine:375]  Labs:  [facility] 01/14/12 0322 01/13/12 0327 01/12/12 0339 01/11/12 1636  WBC 10.2 15.6* 17.1* --  HGB 7.7* 7.5* 8.3* --  PLT 203 193 241 --  LABCREA -- -- -- 112.34  CREATININE 1.83* 2.36* 3.14* --   Estimated Creatinine Clearance: 28.4 ml/min (by C-G formula based on Cr of 1.83). No results found for this basename: VANCOTROUGH:2,VANCOPEAK:2,VANCORANDOM:2,GENTTROUGH:2,GENTPEAK:2,GENTRANDOM:2,TOBRATROUGH:2,TOBRAPEAK:2,TOBRARND:2,AMIKACINPEAK:2,AMIKACINTROU:2,AMIKACIN:2, in the last 72 hours   Microbiology: Recent Results (from the past 720 hour(s))  URINE CULTURE     Status: Normal   Collection Time   01/11/12 11:47 AM      Component Value Range Status Comment   Specimen Description URINE, CATHETERIZED   Final    Special Requests NONE   Final    Culture  Setup Time 01/11/2012 16:43   Final    Colony Count >=100,000 COLONIES/ML   Final    Culture     Final    Value: METHICILLIN RESISTANT STAPHYLOCOCCUS AUREUS     Note: RIFAMPIN AND GENTAMICIN SHOULD NOT BE USED AS SINGLE DRUGS FOR TREATMENT OF STAPH INFECTIONS. CRITICAL RESULT CALLED TO, READ BACK BY AND VERIFIED WITH: PEACE DORMON @ 0622 ON 01/14/2012 HAJAM   Report Status 01/14/2012 FINAL   Final    Organism ID, Bacteria METHICILLIN RESISTANT STAPHYLOCOCCUS AUREUS   Final   CULTURE, BLOOD (ROUTINE X 2)     Status: Normal   Collection Time   01/11/12  3:03 PM      Component Value  Range Status Comment   Specimen Description BLOOD RIGHT ARM  5 ML IN [facility] BOTTLE   Final    Special Requests NONE   Final    Culture  Setup Time 01/11/2012 22:25   Final    Culture     Final    Value: STAPHYLOCOCCUS AUREUS     Note: SUSCEPTIBILITIES PERFORMED ON PREVIOUS CULTURE WITHIN THE LAST 5 DAYS.     Note: Gram Stain Report Called to,Read Back By and Verified With: SHEILA MAIN @ 1746 ON 01/12/12 BY GOLLD   Report Status 01/14/2012 FINAL   Final   CULTURE, BLOOD (ROUTINE X 2)     Status: Normal   Collection Time   01/11/12  3:36 PM      Component Value Range Status Comment   Specimen Description BLOOD RIGHT HAND  3 ML IN [facility] BOTTLE   Final    Special Requests NONE   Final    Culture  Setup Time 01/11/2012 22:25   Final    Culture     Final    Value: METHICILLIN RESISTANT STAPHYLOCOCCUS AUREUS     Note: RIFAMPIN AND GENTAMICIN SHOULD NOT BE USED AS SINGLE DRUGS FOR TREATMENT OF STAPH INFECTIONS. CRITICAL RESULT CALLED TO, READ BACK BY AND VERIFIED WITH: PAM WEST BY INGRAM A 7/9 2P     Note: Gram Stain Report Called to,Read Back By and Verified With: CONRAD WHITROW 01/12/12 1050 BY SMITHERSJ   Report Status 01/14/2012 FINAL  Final    Organism ID, Bacteria METHICILLIN RESISTANT STAPHYLOCOCCUS AUREUS   Final   MRSA PCR SCREENING     Status: Abnormal   Collection Time   01/11/12  4:37 PM      Component Value Range Status Comment   MRSA by PCR POSITIVE (*) NEGATIVE Final   CULTURE, BLOOD (ROUTINE X 2)     Status: Normal (Preliminary result)   Collection Time   01/13/12  1:20 PM      Component Value Range Status Comment   Specimen Description BLOOD RIGHT ARM   Final    Special Requests BOTTLES DRAWN AEROBIC AND ANAEROBIC 5 CC EACH   Final    Culture  Setup Time 01/13/2012 19:33   Final    Culture     Final    Value:        BLOOD CULTURE RECEIVED NO GROWTH TO DATE CULTURE WILL BE HELD FOR 5 DAYS BEFORE ISSUING A FINAL NEGATIVE REPORT   Report Status PENDING   Incomplete   CULTURE, BLOOD  (ROUTINE X 2)     Status: Normal (Preliminary result)   Collection Time   01/13/12  1:25 PM      Component Value Range Status Comment   Specimen Description BLOOD RIGHT ARM   Final    Special Requests BOTTLES DRAWN AEROBIC AND ANAEROBIC 5CC EACH   Final    Culture  Setup Time 01/13/2012 19:33   Final    Culture     Final    Value:        BLOOD CULTURE RECEIVED NO GROWTH TO DATE CULTURE WILL BE HELD FOR 5 DAYS BEFORE ISSUING A FINAL NEGATIVE REPORT   Report Status PENDING   Incomplete     Anti-infectives     Start     Dose/Rate Route Frequency Ordered Stop   01/12/12 1400   piperacillin-tazobactam (ZOSYN) IVPB 2.25 g  Status:  Discontinued        2.25 g 100 mL/hr over 30 Minutes Intravenous Every 6 hours 01/12/12 1302 01/13/12 1158   01/12/12 1045   vancomycin (VANCOCIN) IVPB 1000 mg/200 mL premix        1,000 mg 200 mL/hr over 60 Minutes Intravenous Every 48 hours 01/12/12 1009     01/12/12 0000   piperacillin-tazobactam (ZOSYN) IVPB 2.25 g  Status:  Discontinued        2.25 g 100 mL/hr over 30 Minutes Intravenous Every 8 hours 01/11/12 1553 01/12/12 1302   01/11/12 1600  piperacillin-tazobactam (ZOSYN) IVPB 2.25 g       2.25 g 100 mL/hr over 30 Minutes Intravenous STAT 01/11/12 1546 01/11/12 1705   01/11/12 1300   cefTRIAXone (ROCEPHIN) 1 g in dextrose 5 % 50 mL IVPB        1 g 100 mL/hr over 30 Minutes Intravenous  Once 01/11/12 1258 01/11/12 1333          Assessment: 78 yo M on Day # 3 vanco for MRSA bacteremia and UTI. Acute renal failure is improving, will empirically renally adjust Vanco. ID recommends TEE to r/o endocarditis - awaiting results.  Goal of Therapy:  Vancomycin trough level 15-20 mcg/ml  Plan:  1) Increase Vanco to 1g IV q24h 2) Await results of TEE to r/o endocarditis.  Darrol Angel, PharmD Pager: [phone] 01/14/2012,12:54 PM

## 2012-01-14 NOTE — Progress Notes (Signed)
INFECTIOUS DISEASE PROGRESS NOTE  ID: Jack Stevens is a 78 y.o. male with  Principal Problem:  *Sepsis Active Problems:  Acute on chronic kidney disease, stage 3  UTI (lower urinary tract infection)  Dehydration  EKG abnormalities  Hyponatremia  Weakness generalized  Leukocytosis  BPH (benign prostatic hyperplasia)  Anemia  Acute respiratory distress  Hematuria  HTN (hypertension), LVH by volts  Bacteremia due to Staphylococcus aureus  Subjective: without complaints  Abtx:  Anti-infectives     Start     Dose/Rate Route Frequency Ordered Stop   01/15/12 1000   vancomycin (VANCOCIN) IVPB 1000 mg/200 mL premix        1,000 mg 200 mL/hr over 60 Minutes Intravenous Every 24 hours 01/14/12 1257     01/12/12 1400   piperacillin-tazobactam (ZOSYN) IVPB 2.25 g  Status:  Discontinued        2.25 g 100 mL/hr over 30 Minutes Intravenous Every 6 hours 01/12/12 1302 01/13/12 1158   01/12/12 1045   vancomycin (VANCOCIN) IVPB 1000 mg/200 mL premix  Status:  Discontinued        1,000 mg 200 mL/hr over 60 Minutes Intravenous Every 48 hours 01/12/12 1009 01/14/12 1257   01/12/12 0000   piperacillin-tazobactam (ZOSYN) IVPB 2.25 g  Status:  Discontinued        2.25 g 100 mL/hr over 30 Minutes Intravenous Every 8 hours 01/11/12 1553 01/12/12 1302   01/11/12 1600  piperacillin-tazobactam (ZOSYN) IVPB 2.25 g       2.25 g 100 mL/hr over 30 Minutes Intravenous STAT 01/11/12 1546 01/11/12 1705   01/11/12 1300   cefTRIAXone (ROCEPHIN) 1 g in dextrose 5 % 50 mL IVPB        1 g 100 mL/hr over 30 Minutes Intravenous  Once 01/11/12 1258 01/11/12 1333          Medications:  Scheduled:   . aspirin  81 mg Oral Daily  . Chlorhexidine Gluconate Cloth  6 each Topical Q0600  . diltiazem  120 mg Oral Daily  . dutasteride  0.5 mg Oral Daily  . furosemide  40 mg Intravenous BID  . metoprolol tartrate  25 mg Oral BID  . mupirocin ointment  1 application Nasal BID  . potassium chloride  40  mEq Oral Once  . sertraline  150 mg Oral Daily  . sodium chloride  3 mL Intravenous Q12H  . Tamsulosin HCl  0.4 mg Oral QPC supper  . vancomycin  1,000 mg Intravenous Q24H  . DISCONTD: furosemide  40 mg Intravenous BID  . DISCONTD: vancomycin  1,000 mg Intravenous Q48H    Objective: Vital signs in last 24 hours: Temp:  [98.2 F (36.8 C)-99.3 F (37.4 C)] 99 F (37.2 C) (07/10 1200) Pulse Rate:  [80-93] 80  (07/10 1200) Resp:  [22-25] 22  (07/10 1200) BP: (117-162)/(54-70) 117/60 mmHg (07/10 1200) SpO2:  [96 %-97 %] 96 % (07/10 1200) Weight:  [80.2 kg (176 lb 12.9 oz)] 80.2 kg (176 lb 12.9 oz) (07/10 0500)   General appearance: alert, cooperative and no distress Resp: clear to auscultation bilaterally Cardio: regular rate and rhythm GI: normal findings: bowel sounds normal and soft, non-tender  Lab Results  Basename 01/14/12 0322 01/13/12 0327  WBC 10.2 15.6*  HGB 7.7* 7.5*  HCT 23.1* 22.2*  NA 137 134*  K 3.4* 3.6  CL 109 105  CO2 20 20  BUN 52* 58*  CREATININE 1.83* 2.36*  GLU -- --   Liver Panel  Basename 01/14/12  1610 01/13/12 0327 01/12/12 0339  PROT -- 6.2 6.6  ALBUMIN 1.7* 1.8* --  AST -- 16 13  ALT -- 12 9  ALKPHOS -- 81 63  BILITOT -- 0.3 0.2*  BILIDIR -- -- --  IBILI -- -- --   Sedimentation Rate No results found for this basename: ESRSEDRATE in the last 72 hours C-Reactive Protein No results found for this basename: CRP:2 in the last 72 hours  Microbiology: Recent Results (from the past 240 hour(s))  URINE CULTURE     Status: Normal   Collection Time   01/11/12 11:47 AM      Component Value Range Status Comment   Specimen Description URINE, CATHETERIZED   Final    Special Requests NONE   Final    Culture  Setup Time 01/11/2012 16:43   Final    Colony Count >=100,000 COLONIES/ML   Final    Culture     Final    Value: METHICILLIN RESISTANT STAPHYLOCOCCUS AUREUS     Note: RIFAMPIN AND GENTAMICIN SHOULD NOT BE USED AS SINGLE DRUGS FOR  TREATMENT OF STAPH INFECTIONS. CRITICAL RESULT CALLED TO, READ BACK BY AND VERIFIED WITH: PEACE DORMON @ 0622 ON 01/14/2012 HAJAM   Report Status 01/14/2012 FINAL   Final    Organism ID, Bacteria METHICILLIN RESISTANT STAPHYLOCOCCUS AUREUS   Final   CULTURE, BLOOD (ROUTINE X 2)     Status: Normal   Collection Time   01/11/12  3:03 PM      Component Value Range Status Comment   Specimen Description BLOOD RIGHT ARM  5 ML IN [facility] BOTTLE   Final    Special Requests NONE   Final    Culture  Setup Time 01/11/2012 22:25   Final    Culture     Final    Value: STAPHYLOCOCCUS AUREUS     Note: SUSCEPTIBILITIES PERFORMED ON PREVIOUS CULTURE WITHIN THE LAST 5 DAYS.     Note: Gram Stain Report Called to,Read Back By and Verified With: SHEILA MAIN @ 1746 ON 01/12/12 BY GOLLD   Report Status 01/14/2012 FINAL   Final   CULTURE, BLOOD (ROUTINE X 2)     Status: Normal   Collection Time   01/11/12  3:36 PM      Component Value Range Status Comment   Specimen Description BLOOD RIGHT HAND  3 ML IN [facility] BOTTLE   Final    Special Requests NONE   Final    Culture  Setup Time 01/11/2012 22:25   Final    Culture     Final    Value: METHICILLIN RESISTANT STAPHYLOCOCCUS AUREUS     Note: RIFAMPIN AND GENTAMICIN SHOULD NOT BE USED AS SINGLE DRUGS FOR TREATMENT OF STAPH INFECTIONS. CRITICAL RESULT CALLED TO, READ BACK BY AND VERIFIED WITH: PAM WEST BY INGRAM A 7/9 2P     Note: Gram Stain Report Called to,Read Back By and Verified With: CONRAD WHITROW 01/12/12 1050 BY SMITHERSJ   Report Status 01/14/2012 FINAL   Final    Organism ID, Bacteria METHICILLIN RESISTANT STAPHYLOCOCCUS AUREUS   Final   MRSA PCR SCREENING     Status: Abnormal   Collection Time   01/11/12  4:37 PM      Component Value Range Status Comment   MRSA by PCR POSITIVE (*) NEGATIVE Final   CULTURE, BLOOD (ROUTINE X 2)     Status: Normal (Preliminary result)   Collection Time   01/13/12  1:20 PM      Component Value  Range Status Comment   Specimen  Description BLOOD RIGHT ARM   Final    Special Requests BOTTLES DRAWN AEROBIC AND ANAEROBIC 5 CC EACH   Final    Culture  Setup Time 01/13/2012 19:33   Final    Culture     Final    Value:        BLOOD CULTURE RECEIVED NO GROWTH TO DATE CULTURE WILL BE HELD FOR 5 DAYS BEFORE ISSUING A FINAL NEGATIVE REPORT   Report Status PENDING   Incomplete   CULTURE, BLOOD (ROUTINE X 2)     Status: Normal (Preliminary result)   Collection Time   01/13/12  1:25 PM      Component Value Range Status Comment   Specimen Description BLOOD RIGHT ARM   Final    Special Requests BOTTLES DRAWN AEROBIC AND ANAEROBIC 5CC EACH   Final    Culture  Setup Time 01/13/2012 19:33   Final    Culture     Final    Value:        BLOOD CULTURE RECEIVED NO GROWTH TO DATE CULTURE WILL BE HELD FOR 5 DAYS BEFORE ISSUING A FINAL NEGATIVE REPORT   Report Status PENDING   Incomplete     Studies/Results: Ct Abdomen Pelvis Wo Contrast  01/14/2012  *RADIOLOGY REPORT*  Clinical Data: Doses question bladder mass or diverticula on ultrasound  CT ABDOMEN AND PELVIS WITHOUT CONTRAST  Technique:  Multidetector CT imaging of the abdomen and pelvis was performed following the standard protocol without intravenous contrast. Oral contrast not administered.  Sagittal and coronal MPR images reconstructed from axial data set.  Comparison: Renal ultrasound 01/12/2012  Findings: Bibasilar pleural effusions and atelectasis. Mildly complicated cyst with wall calcification lateral aspect upper pole left kidney 4.1 x 4.4 x 4.0 cm. 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 No right hydronephrosis but mild right ureteral dilatation present. Marked enlargement of prostate gland, approximately 8.0 x 6.5 x 6.3 cm. Bilateral bladder diverticula are identified anterior and superior to the ureteral orifices, containing dependent calculi. Additional diverticulum at dome of bladder containing air. Foley catheter present. Diffuse  bladder wall thickening identified, unable to exclude tumor.  Calcified granulomata in liver and spleen. Distended gallbladder. Scattered respiratory motion artifacts. No definite additional focal abnormalities of liver, spleen, pancreas, or adrenal glands. Moderate sized hiatal hernia. Extensive atherosclerotic calcifications. Normal appendix. Colonic diverticulosis. No mass, adenopathy, free fluid, or inflammatory process. No acute osseous findings.  IMPRESSION: Marked prostatic enlargement. Diffuse bladder wall thickening, can be seen with inflammatory and neoplastic processes; correlation with cystoscopy recommended. Multiple bladder diverticula some which contain dependent calculi. 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 Mild right ureteral dilatation without hydronephrosis. Sigmoid diverticulosis. Hiatal hernia. Bibasilar pleural effusions and atelectasis. Extensive atherosclerotic disease.  Original Report Authenticated By: Lollie Marrow, M.D.     Assessment/Plan: MRSA bacteremia Urosepsis Day 3 Vancomycin Prostate enlargement ARF  Would- rec TEE (TTE is only 50% sensitive per 2003 and 2004 literature. ?If this has been updated...) Continue vancomycin Await repeat BCx If TEE not done, plan for prolonged treatment (~4 weeks) Cr improving Defer to uro on plans for obstruction  Johny Sax Infectious Diseases [phone] 01/14/2012, 3:49 PM   LOS: 3 days

## 2012-01-14 NOTE — Progress Notes (Addendum)
Pt. Seen and examined. Agree with the NP/PA-C note as written.  2D echo reviewed, there is trivial MR and TR and no obvious valvular abnormalities. Given the lack of significant regurgitation, clinically meaningful endocarditis is unlikely. I agree that staph bacteremia increases his risk of seeding the valve, but there is no evidence this has occurred. TEE is unlikely to reveal any unidentified significant valvular vegetations. Agree with antibiotic treatment. WBC count is normalized now. BNP is elevated to 8047, owing to an element of diastolic heart failure. CXR did show vascular congestion. Would recommend lasix. Renal function appears to be improving as bladder outlet obstruction is relieved per urology.   Chrystie Nose, MD, [facility] Attending Cardiologist [facility]

## 2012-01-14 NOTE — Progress Notes (Signed)
[facility] PROGRESS NOTE  Marino Rogerson YNW:[id number] DOB: 11/27/1921 DOA: 01/11/2012 PCP: No primary provider on file.  Assessment/Plan: Principal Problem:  *Sepsis Active Problems:  Acute on chronic kidney disease, stage 3  UTI (lower urinary tract infection)  Dehydration  EKG abnormalities  Hyponatremia  Weakness generalized  Leukocytosis  BPH (benign prostatic hyperplasia)  Anemia  Acute respiratory distress  Hematuria  HTN (hypertension), LVH by volts  Bacteremia due to Staphylococcus aureus  #1 sepsis  Likely secondary to UTI and probable bacteremia. Patient's tachycardia has improved on rate limiting medications and antibiotics. Respiratory rate is also improved. WBC is trending down  Lactic acid level is trending down  Pro calcitonin is trending down at 11.49 from 22.14 from 33.30. Will decrease patient's IV fluids to 100 cc per hour. Urine cultures are pending and preliminary consistent with staph aureus. Blood cultures are pending and preliminary findings consistent with staph aureus in 2 out of 2 blood cultures..  IV vancomycin. Follow.  Appreciate ID's help: recommend TEE- will ask cardiology  #2 acute respiratory distress  Likely secondary to problem #1. ABG with a pH of 7.4 PCO2 of 27 PO2 of 91 bicarbonate of 18 from 01/12/2012. Tachycardia PNA has improved. Lactic acid and pro calcitonin levels are trending down. Blood cultures and urine cultures are pending and consistent with staph aureus. IV vancomycin   #3 Probable Bacteremia  Blood cultures preliminary results growing staph aureus 2 out of 2. . Sensitivities are pending. Patient is afebrile. White count is trending down. Continue vancomycin day #3   #4 UTI  Urine cultures are pending with preliminary results showing staph aureus. IV vancomycin Follow.   #5 gross hematuria  Questionable etiology. Renal ultrasound consistent with bilateral hydronephrosis with a very heterogeneous bladder concerning for  an obstructive bladder mass. Hematuria is slowly improving urine is clearing up. Continue indwelling Foley catheter and irrigate as needed.  Urology is following and recommended cystoscopy when patient is medically stable. Per urology. CT scan abdomen and pelvis without IV contrast to reassess hydronephrosis and reevaluate bladder   #6 acute on chronic kidney disease stage III  Patient on discharge from  on 01/06/2012 and a creatinine of 1.29. Admission creatinine was 3.95. Secondary to post renal azotemia likely secondary to obstruction from BPH in the setting of volume depletion and dehydration and sepsis.  Renal function is slowly trending down. Creatinine improving. Renal ultrasound is consistent with bilateral hydronephrosis. Decrease IV fluids to 100 cc per hour. Follow. Urology is following and appreciate input and recommendations.   #7 dehydration  IV fluids   #8 EKG changes  Patient with shortness of breath however denies any chest pain. Cardiac enzymes are negative x3. 2-D echo with EF of 70-75%. No wall motion abnormalities. Repeat EKG with T wave inversion in leads 1 aVL V4 through V6. Aspirin started per cardiology will need to monitor closely with patient's hematuria. Patient also started on diltiazem and metoprolol. Per cardiology patient will need ischemic evaluation once medically stable with probable initial Myoview scan. Cardiology is following and appreciate input and recommendations.   #9 BPH  Foley catheter. Per urology. Continue Avodart, Flomax. Patient will likely need a cystoscopy once medically stable.   #10 hyponatremia  Likely secondary to volume depletion. TSH is within normal limits.  Improving with hydration.   #11 depression/anxiety  Continue home regimen of Zoloft and Xanax.   #12 hypertension  Patient started on metoprolol and diltiazem per cardiology. Follow.   #13 severe iron deficiency anemia/acute  blood loss anemia  H&H is trending down likely  secondary to patient's hematuria in the setting of sepsis. Hemoglobin now is at 7.5 from 9.2. Hematuria is slowly clearing up. Transfusion threshold hemoglobin less than 7. Once patient gets over this hospitalization if he has not had a colonoscopy done may benefit from 1. May need iron supplements on discharge.   #14 prophylaxis  PPI for GI prophylaxis. SCDs for DVT prophylaxis.   Code Status: full Family Communication:  Disposition Plan:    Consultants: Cards Urology ID  HPI/Subjective: Says feeling slightly better today than yesterday No fever No chills  Objective: Filed Vitals:   01/14/12 0500 01/14/12 0800 01/14/12 1014 01/14/12 1200  BP:   162/70 117/60  Pulse:   93 80  Temp:  99.3 F (37.4 C)    TempSrc:  Oral    Resp:    22  Height:      Weight: 80.2 kg (176 lb 12.9 oz)     SpO2:    96%    Intake/Output Summary (Last 24 hours) at 01/14/12 1313 Last data filed at 01/14/12 1000  Gross per 24 hour  Intake    300 ml  Output   2275 ml  Net  -1975 ml    Exam:  General: Alert, awake, oriented to self and place, in no acute distress.  HEENT: No bruits, no goiter.  Heart: RRR, without murmurs, rubs, gallops.  Lungs: Clear to auscultation bilaterally in anterior lung fields.  Abdomen: Soft, nontender, nondistended, positive bowel sounds.  Extremities: No clubbing cyanosis or edema with positive pedal pulses.  Neuro: Grossly intact, nonfocal. Scrotum swelling   Data Reviewed: Basic Metabolic Panel:  Lab 01/14/12 5852 01/13/12 0327 01/12/12 0339 01/11/12 1159  NA 137 134* 129* 130*  K 3.4* 3.6 3.7 4.8  CL 109 105 99 98  CO2 20 20 18* 19  GLUCOSE 87 107* 129* 134*  BUN 52* 58* 66* 69*  CREATININE 1.83* 2.36* 3.14* 3.95*  CALCIUM 8.7 8.7 8.7 9.1  MG 2.0 -- -- 1.9  PHOS 3.2 -- -- --   Liver Function Tests:  Lab 01/14/12 0322 01/13/12 0327 01/12/12 0339 01/11/12 1159  AST -- 16 13 18   ALT -- 12 9 12   ALKPHOS -- 81 63 67  BILITOT -- 0.3 0.2* 0.2*    PROT -- 6.2 6.6 7.2  ALBUMIN 1.7* 1.8* 2.1* 2.4*   No results found for this basename: LIPASE:5,AMYLASE:5 in the last 168 hours No results found for this basename: AMMONIA:5 in the last 168 hours CBC:  Lab 01/14/12 0322 01/13/12 0327 01/12/12 0339 01/11/12 1159  WBC 10.2 15.6* 17.1* 21.7*  NEUTROABS 9.2* 14.5* 16.3* 20.4*  HGB 7.7* 7.5* 8.3* 9.2*  HCT 23.1* 22.2* 24.2* 27.2*  MCV 92.4 92.9 91.7 93.8  PLT 203 193 241 299   Cardiac Enzymes:  Lab 01/12/12 0339 01/11/12 1947 01/11/12 1159  CKTOTAL 34 53 60  CKMB 2.0 2.2 3.2  CKMBINDEX -- -- --  TROPONINI <0.30 <0.30 <0.30   BNP (last 3 results)  Basename 01/14/12 0330  PROBNP 8047.0*   CBG: No results found for this basename: GLUCAP:5 in the last 168 hours  Recent Results (from the past 240 hour(s))  URINE CULTURE     Status: Normal   Collection Time   01/11/12 11:47 AM      Component Value Range Status Comment   Specimen Description URINE, CATHETERIZED   Final    Special Requests NONE   Final  Culture  Setup Time 01/11/2012 16:43   Final    Colony Count >=100,000 COLONIES/ML   Final    Culture     Final    Value: METHICILLIN RESISTANT STAPHYLOCOCCUS AUREUS     Note: RIFAMPIN AND GENTAMICIN SHOULD NOT BE USED AS SINGLE DRUGS FOR TREATMENT OF STAPH INFECTIONS. CRITICAL RESULT CALLED TO, READ BACK BY AND VERIFIED WITH: PEACE DORMON @ 0622 ON 01/14/2012 HAJAM   Report Status 01/14/2012 FINAL   Final    Organism ID, Bacteria METHICILLIN RESISTANT STAPHYLOCOCCUS AUREUS   Final   CULTURE, BLOOD (ROUTINE X 2)     Status: Normal   Collection Time   01/11/12  3:03 PM      Component Value Range Status Comment   Specimen Description BLOOD RIGHT ARM  5 ML IN [facility] BOTTLE   Final    Special Requests NONE   Final    Culture  Setup Time 01/11/2012 22:25   Final    Culture     Final    Value: STAPHYLOCOCCUS AUREUS     Note: SUSCEPTIBILITIES PERFORMED ON PREVIOUS CULTURE WITHIN THE LAST 5 DAYS.     Note: Gram Stain Report Called  to,Read Back By and Verified With: SHEILA MAIN @ 1746 ON 01/12/12 BY GOLLD   Report Status 01/14/2012 FINAL   Final   CULTURE, BLOOD (ROUTINE X 2)     Status: Normal   Collection Time   01/11/12  3:36 PM      Component Value Range Status Comment   Specimen Description BLOOD RIGHT HAND  3 ML IN [facility] BOTTLE   Final    Special Requests NONE   Final    Culture  Setup Time 01/11/2012 22:25   Final    Culture     Final    Value: METHICILLIN RESISTANT STAPHYLOCOCCUS AUREUS     Note: RIFAMPIN AND GENTAMICIN SHOULD NOT BE USED AS SINGLE DRUGS FOR TREATMENT OF STAPH INFECTIONS. CRITICAL RESULT CALLED TO, READ BACK BY AND VERIFIED WITH: PAM WEST BY INGRAM A 7/9 2P     Note: Gram Stain Report Called to,Read Back By and Verified With: CONRAD WHITROW 01/12/12 1050 BY SMITHERSJ   Report Status 01/14/2012 FINAL   Final    Organism ID, Bacteria METHICILLIN RESISTANT STAPHYLOCOCCUS AUREUS   Final   MRSA PCR SCREENING     Status: Abnormal   Collection Time   01/11/12  4:37 PM      Component Value Range Status Comment   MRSA by PCR POSITIVE (*) NEGATIVE Final   CULTURE, BLOOD (ROUTINE X 2)     Status: Normal (Preliminary result)   Collection Time   01/13/12  1:20 PM      Component Value Range Status Comment   Specimen Description BLOOD RIGHT ARM   Final    Special Requests BOTTLES DRAWN AEROBIC AND ANAEROBIC 5 CC EACH   Final    Culture  Setup Time 01/13/2012 19:33   Final    Culture     Final    Value:        BLOOD CULTURE RECEIVED NO GROWTH TO DATE CULTURE WILL BE HELD FOR 5 DAYS BEFORE ISSUING A FINAL NEGATIVE REPORT   Report Status PENDING   Incomplete   CULTURE, BLOOD (ROUTINE X 2)     Status: Normal (Preliminary result)   Collection Time   01/13/12  1:25 PM      Component Value Range Status Comment   Specimen Description BLOOD RIGHT ARM  Final    Special Requests BOTTLES DRAWN AEROBIC AND ANAEROBIC 5CC EACH   Final    Culture  Setup Time 01/13/2012 19:33   Final    Culture     Final    Value:         BLOOD CULTURE RECEIVED NO GROWTH TO DATE CULTURE WILL BE HELD FOR 5 DAYS BEFORE ISSUING A FINAL NEGATIVE REPORT   Report Status PENDING   Incomplete      Studies: Ct Abdomen Pelvis Wo Contrast  01/14/2012  *RADIOLOGY REPORT*  Clinical Data: Doses question bladder mass or diverticula on ultrasound  CT ABDOMEN AND PELVIS WITHOUT CONTRAST  Technique:  Multidetector CT imaging of the abdomen and pelvis was performed following the standard protocol without intravenous contrast. Oral contrast not administered.  Sagittal and coronal MPR images reconstructed from axial data set.  Comparison: Renal ultrasound 01/12/2012  Findings: Bibasilar pleural effusions and atelectasis. Mildly complicated cyst with wall calcification lateral aspect upper pole left kidney 4.1 x 4.4 x 4.0 cm. 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 No right hydronephrosis but mild right ureteral dilatation present. Marked enlargement of prostate gland, approximately 8.0 x 6.5 x 6.3 cm. Bilateral bladder diverticula are identified anterior and superior to the ureteral orifices, containing dependent calculi. Additional diverticulum at dome of bladder containing air. Foley catheter present. Diffuse bladder wall thickening identified, unable to exclude tumor.  Calcified granulomata in liver and spleen. Distended gallbladder. Scattered respiratory motion artifacts. No definite additional focal abnormalities of liver, spleen, pancreas, or adrenal glands. Moderate sized hiatal hernia. Extensive atherosclerotic calcifications. Normal appendix. Colonic diverticulosis. No mass, adenopathy, free fluid, or inflammatory process. No acute osseous findings.  IMPRESSION: Marked prostatic enlargement. Diffuse bladder wall thickening, can be seen with inflammatory and neoplastic processes; correlation with cystoscopy recommended. Multiple bladder diverticula some which contain dependent calculi. Left hydronephrosis and  hydroureter with note of partial duplication of the left ureter. Mild right ureteral dilatation without hydronephrosis. Sigmoid diverticulosis. Hiatal hernia. Bibasilar pleural effusions and atelectasis. Extensive atherosclerotic disease.  Original Report Authenticated By: Lollie Marrow, M.D.   US Renal  01/12/2012  *RADIOLOGY REPORT*  Clinical Data: 78 year old male with hematuria, UTI, acute renal failure.  RENAL/URINARY TRACT ULTRASOUND COMPLETE  Comparison:  None.  Findings:  Right Kidney:  Hydronephrosis.  Evidence of echogenic debris in the right renal pelvis and proximal ureter.  Hydroureter is evident. Renal length 11.5 cm.  No focal right renal lesion is identified.  Left Kidney:  Hydronephrosis, moderate to severe in greater than that on the right.  No debris evident in the left renal collecting system.  Hydroureter is evident.  There is a superimposed simple appearing exophytic renal cyst measuring up to 48 mm diameter. Renal length is 12.9 cm.  Bladder:  At least partially decompressed, and a Foley catheter balloon is partially visible.  However, to complex appearing cystic structures are noted adjacent to the bladder.  These might be bladder diverticula containing debris, uncertain.  The bladder also has a heterogeneous appearance.  Furthermore there is a linear echogenic structure partially visible which might represent a stent, uncertain.  IMPRESSION: Left greater than right bilateral obstructive uropathy.  The obstructing etiology is not identified, but the bladder is very heterogeneous such that an obstructing bladder mass is not excluded. There is evidence of echogenic debris in the obstructed right side system, which may indicate pyonephrosis.  Additionally, there may be bladder diverticula containing debris. Overall, recommend follow-up CT abdomen and  pelvis (can be done without contrast if necessary) to better characterize.  Original Report Authenticated By: Harley Hallmark, M.D.     Scheduled Meds:   . aspirin  81 mg Oral Daily  . Chlorhexidine Gluconate Cloth  6 each Topical Q0600  . diltiazem  120 mg Oral Daily  . dutasteride  0.5 mg Oral Daily  . metoprolol tartrate  25 mg Oral BID  . mupirocin ointment  1 application Nasal BID  . sertraline  150 mg Oral Daily  . sodium chloride  3 mL Intravenous Q12H  . Tamsulosin HCl  0.4 mg Oral QPC supper  . vancomycin  1,000 mg Intravenous Q24H  . DISCONTD: pantoprazole (PROTONIX) IV  40 mg Intravenous Q24H  . DISCONTD: vancomycin  1,000 mg Intravenous Q48H   Continuous Infusions:   . sodium chloride 20 mL/hr at 01/13/12 2023     Marlin Canary [facility] Pager [phone] If 8PM-8AM, please contact night-coverage [email or web address] Password [email or web address] 01/14/2012, 1:13 PM   LOS: 3 days

## 2012-01-14 NOTE — Progress Notes (Signed)
Subjective: Patient reports No abdominal pain or discomfort.  Objective: Vital signs in last 24 hours: Temp:  [97.8 F (36.6 C)-99.1 F (37.3 C)] 98.8 F (37.1 C) (07/10 0400) Pulse Rate:  [80-97] 81  (07/09 1600) Resp:  [19-28] 25  (07/09 1600) BP: (111-124)/(52-67) 120/54 mmHg (07/09 1600) SpO2:  [94 %-97 %] 97 % (07/09 1600) Weight:  [176 lb 12.9 oz (80.2 kg)] 176 lb 12.9 oz (80.2 kg) (07/10 0500)  Intake/Output from previous day: 07/09 0701 - 07/10 0700 In: 360 [I.V.:240] Out: 2250 [Urine:2250] Intake/Output this shift:    Physical Exam:  General: More alert this morning Lungs - Normal respiratory effort, chest expands symmetrically.  Abdomen - Soft, non-tender & non-distended Foley draining well. Urine grossly clear today.  Urinary output: 2250 cc Moderate scrotal swelling. Creat: 1.83. Urine culture: > 100.000 col. MSRA Lab Results:  Basename 01/14/12 0322 01/13/12 0327 01/12/12 0339  HGB 7.7* 7.5* 8.3*  HCT 23.1* 22.2* 24.2*   BMET  Basename 01/14/12 0322 01/13/12 0327  NA 137 134*  K 3.4* 3.6  CL 109 105  CO2 20 20  GLUCOSE 87 107*  BUN 52* 58*  CREATININE 1.83* 2.36*  CALCIUM 8.7 8.7    Basename 01/11/12 1159  LABPT --  INR 1.29   No results found for this basename: LABURIN:1 in the last 72 hours Results for orders placed during the hospital encounter of 01/11/12  URINE CULTURE     Status: Normal   Collection Time   01/11/12 11:47 AM      Component Value Range Status Comment   Specimen Description URINE, CATHETERIZED   Final    Special Requests NONE   Final    Culture  Setup Time 01/11/2012 16:43   Final    Colony Count >=100,000 COLONIES/ML   Final    Culture     Final    Value: METHICILLIN RESISTANT STAPHYLOCOCCUS AUREUS     Note: RIFAMPIN AND GENTAMICIN SHOULD NOT BE USED AS SINGLE DRUGS FOR TREATMENT OF STAPH INFECTIONS. CRITICAL RESULT CALLED TO, READ BACK BY AND VERIFIED WITH: PEACE DORMON @ 0622 ON 01/14/2012 HAJAM   Report Status  01/14/2012 FINAL   Final    Organism ID, Bacteria METHICILLIN RESISTANT STAPHYLOCOCCUS AUREUS   Final   CULTURE, BLOOD (ROUTINE X 2)     Status: Normal (Preliminary result)   Collection Time   01/11/12  3:03 PM      Component Value Range Status Comment   Specimen Description BLOOD RIGHT ARM  5 ML IN [facility] BOTTLE   Final    Special Requests NONE   Final    Culture  Setup Time 01/11/2012 22:25   Final    Culture     Final    Value: STAPHYLOCOCCUS AUREUS     Note: Gram Stain Report Called to,Read Back By and Verified With: SHEILA MAIN @ 1746 ON 01/12/12 BY GOLLD   Report Status PENDING   Incomplete   CULTURE, BLOOD (ROUTINE X 2)     Status: Normal (Preliminary result)   Collection Time   01/11/12  3:36 PM      Component Value Range Status Comment   Specimen Description BLOOD RIGHT HAND  3 ML IN [facility] BOTTLE   Final    Special Requests NONE   Final    Culture  Setup Time 01/11/2012 22:25   Final    Culture     Final    Value: METHICILLIN RESISTANT STAPHYLOCOCCUS AUREUS     Note: RIFAMPIN  AND GENTAMICIN SHOULD NOT BE USED AS SINGLE DRUGS FOR TREATMENT OF STAPH INFECTIONS. CRITICAL RESULT CALLED TO, READ BACK BY AND VERIFIED WITH: PAM WEST BY INGRAM A 7/9 2P     Note: Gram Stain Report Called to,Read Back By and Verified With: CONRAD WHITROW 01/12/12 1050 BY SMITHERSJ   Report Status PENDING   Incomplete   MRSA PCR SCREENING     Status: Abnormal   Collection Time   01/11/12  4:37 PM      Component Value Range Status Comment   MRSA by PCR POSITIVE (*) NEGATIVE Final     Studies/Results: US Renal  01/12/2012  *RADIOLOGY REPORT*  Clinical Data: 78 year old male with hematuria, UTI, acute renal failure.  RENAL/URINARY TRACT ULTRASOUND COMPLETE  Comparison:  None.  Findings:  Right Kidney:  Hydronephrosis.  Evidence of echogenic debris in the right renal pelvis and proximal ureter.  Hydroureter is evident. Renal length 11.5 cm.  No focal right renal lesion is identified.  Left Kidney:  Hydronephrosis,  moderate to severe in greater than that on the right.  No debris evident in the left renal collecting system.  Hydroureter is evident.  There is a superimposed simple appearing exophytic renal cyst measuring up to 48 mm diameter. Renal length is 12.9 cm.  Bladder:  At least partially decompressed, and a Foley catheter balloon is partially visible.  However, to complex appearing cystic structures are noted adjacent to the bladder.  These might be bladder diverticula containing debris, uncertain.  The bladder also has a heterogeneous appearance.  Furthermore there is a linear echogenic structure partially visible which might represent a stent, uncertain.  IMPRESSION: Left greater than right bilateral obstructive uropathy.  The obstructing etiology is not identified, but the bladder is very heterogeneous such that an obstructing bladder mass is not excluded. There is evidence of echogenic debris in the obstructed right side system, which may indicate pyonephrosis.  Additionally, there may be bladder diverticula containing debris. Overall, recommend follow-up CT abdomen and pelvis (can be done without contrast if necessary) to better characterize.  Original Report Authenticated By: Harley Hallmark, M.D.   Dg Chest Port 1 View  01/12/2012  *RADIOLOGY REPORT*  Clinical Data: T kidney.  PORTABLE CHEST - 1 VIEW  Comparison: 01/11/2012.  Findings: Mild pulmonary vascular congestion.  Slight increased markings lung bases may represent atelectasis.  Subtle infiltrate not entirely excluded.  Hiatal hernia.  Heart size top normal. Minimally tortuous aorta.  No pneumothorax.  IMPRESSION: Pulmonary vascular congestion.  Slight increased markings lung bases as noted above.  Hiatal hernia.  Original Report Authenticated By: Fuller Canada, M.D.    Assessment/Plan:  Gross hematuria.  Bilateral hydronephrosis.  Renal insufficiency.  BPH  CT scan abdomen and pelvis without IV contrast to reassess hydronephrosis and reevaluate  bladder.  Elevate scrotum.  Bladder irrigation PRN.   LOS: 3 days   Ericka Marcellus-HENRY 01/14/2012, 8:31 AM

## 2012-01-14 NOTE — Progress Notes (Signed)
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 Kirtland Bouchard Schoor ([place]), was notified through [email or web address]. ---- Doraine Schexnider, D. rn

## 2012-01-15 LAB — BASIC METABOLIC PANEL
BUN: 41 mg/dL — ABNORMAL HIGH (ref 6–23)
Creatinine, Ser: 1.63 mg/dL — ABNORMAL HIGH (ref 0.50–1.35)
GFR calc Af Amer: 42 mL/min — ABNORMAL LOW (ref >90)
GFR calc non Af Amer: 36 mL/min — ABNORMAL LOW (ref >90)
Glucose, Bld: 137 mg/dL — ABNORMAL HIGH (ref 70–99)
Potassium: 3.3 mEq/L — ABNORMAL LOW (ref 3.5–5.1)

## 2012-01-15 LAB — RENAL FUNCTION PANEL
BUN: 41 mg/dL — ABNORMAL HIGH (ref 6–23)
CO2: 23 mEq/L (ref 19–32)
Calcium: 8.9 mg/dL (ref 8.4–10.5)
Creatinine, Ser: 1.6 mg/dL — ABNORMAL HIGH (ref 0.50–1.35)
Glucose, Bld: 118 mg/dL — ABNORMAL HIGH (ref 70–99)
Phosphorus: 2.5 mg/dL (ref 2.3–4.6)
Sodium: 136 mEq/L (ref 135–145)

## 2012-01-15 LAB — CBC
HCT: 23.8 % — ABNORMAL LOW (ref 39.0–52.0)
Hemoglobin: 7.8 g/dL — ABNORMAL LOW (ref 13.0–17.0)
MCH: 30.8 pg (ref 26.0–34.0)
MCHC: 32.8 g/dL (ref 30.0–36.0)
MCV: 94.1 fL (ref 78.0–100.0)
RDW: 15 % (ref 11.5–15.5)

## 2012-01-15 MED ORDER — METOPROLOL TARTRATE 25 MG PO TABS
25.0000 mg | ORAL_TABLET | Freq: Three times a day (TID) | ORAL | Status: DC
  Administered 2012-01-15 – 2012-01-19 (×12): 25 mg via ORAL
  Filled 2012-01-15 (×14): qty 1

## 2012-01-15 MED ORDER — POTASSIUM CHLORIDE CRYS ER 20 MEQ PO TBCR
40.0000 meq | EXTENDED_RELEASE_TABLET | Freq: Two times a day (BID) | ORAL | Status: AC
  Administered 2012-01-15 – 2012-01-16 (×2): 40 meq via ORAL
  Filled 2012-01-15 (×3): qty 2

## 2012-01-15 MED ORDER — FUROSEMIDE 10 MG/ML IJ SOLN
40.0000 mg | Freq: Once | INTRAMUSCULAR | Status: AC
  Administered 2012-01-16: 40 mg via INTRAVENOUS
  Filled 2012-01-15: qty 4

## 2012-01-15 NOTE — Clinical Social Work Note (Signed)
CSW continues to follow. Plan is to d/c to [facility] when medically ready. Family has held Pt's bed at [facility] for return. Pt will need PT/OT which is pending. FL2 form begun in shadow chart and will need signature from MD. CSW to update FL2 prior to d/c. CSW to follow for return to SNF. Pt was sleeping when CSW came by.   Doreen Salvage, LCSWA Clinical Social Worker [facility] Cell [phone]

## 2012-01-15 NOTE — Progress Notes (Signed)
Subjective: No chest Pain, no acute SOB, obviously does not feel well  Objective: Vital signs in last 24 hours: Temp:  [97.5 F (36.4 C)-100.3 F (37.9 C)] 98.7 F (37.1 C) (07/11 0800) Pulse Rate:  [73-98] 87  (07/11 1100) Resp:  [15-30] 22  (07/11 1100) BP: (112-156)/(49-78) 112/54 mmHg (07/11 1100) SpO2:  [92 %-99 %] 92 % (07/11 1100) Weight:  [77.8 kg (171 lb 8.3 oz)] 77.8 kg (171 lb 8.3 oz) (07/11 0500) Weight change: -2.4 kg (-5 lb 4.7 oz) Last BM Date: 01/14/12 Intake/Output from previous day: -2155 07/10 0701 - 07/11 0700 In: 410 [I.V.:410] Out: 2575 [Urine:2575] Intake/Output this shift: Total I/O In: 250 [I.V.:50; IV Piggyback:200] Out: 1000 [Urine:1000]  PE: General:alert and oriented  Heart:S1S2 irreg with SR then bursts of PACs Lungs:fairly clear ant. Abd:+ BS mild tenderness Ext:no edema    Lab Results:  Basename 01/15/12 0320 01/14/12 0322  WBC 8.4 10.2  HGB 7.8* 7.7*  HCT 23.8* 23.1*  PLT 206 203   BMET  Basename 01/15/12 0320 01/14/12 0322  NA 161096 137  K 3.2*3.3* 3.4*  CL 106106 109  CO2 2322 20  GLUCOSE 118*137* 87  BUN 41*41* 52*  CREATININE 1.60*1.63* 1.83*  CALCIUM 8.98.6 8.7   No results found for this basename: TROPONINI:2,CK,MB:2 in the last 72 hours  No results found for this basename: CHOL, HDL, LDLCALC, LDLDIRECT, TRIG, CHOLHDL   No results found for this basename: HGBA1C     Lab Results  Component Value Date   TSH 1.354 01/11/2012    Hepatic Function Panel  Basename 01/15/12 0320 01/13/12 0327  PROT -- 6.2  ALBUMIN 1.8* --  AST -- 16  ALT -- 12  ALKPHOS -- 81  BILITOT -- 0.3  BILIDIR -- --  IBILI -- --   No results found for this basename: CHOL in the last 72 hours No results found for this basename: PROTIME in the last 72 hours    EKG: Orders placed during the hospital encounter of 01/11/12  . ED EKG  . ED EKG  . EKG 12-LEAD  . EKG 12-LEAD  . EKG 12-LEAD  . EKG 12-LEAD  . EKG      Studies/Results: Ct Abdomen Pelvis Wo Contrast  01/14/2012  *RADIOLOGY REPORT*  Clinical Data: Doses question bladder mass or diverticula on ultrasound  CT ABDOMEN AND PELVIS WITHOUT CONTRAST  Technique:  Multidetector CT imaging of the abdomen and pelvis was performed following the standard protocol without intravenous contrast. Oral contrast not administered.  Sagittal and coronal MPR images reconstructed from axial data set.  Comparison: Renal ultrasound 01/12/2012  Findings: Bibasilar pleural effusions and atelectasis. Mildly complicated cyst with wall calcification lateral aspect upper pole left kidney 4.1 x 4.4 x 4.0 cm. 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 No right hydronephrosis but mild right ureteral dilatation present. Marked enlargement of prostate gland, approximately 8.0 x 6.5 x 6.3 cm. Bilateral bladder diverticula are identified anterior and superior to the ureteral orifices, containing dependent calculi. Additional diverticulum at dome of bladder containing air. Foley catheter present. Diffuse bladder wall thickening identified, unable to exclude tumor.  Calcified granulomata in liver and spleen. Distended gallbladder. Scattered respiratory motion artifacts. No definite additional focal abnormalities of liver, spleen, pancreas, or adrenal glands. Moderate sized hiatal hernia. Extensive atherosclerotic calcifications. Normal appendix. Colonic diverticulosis. No mass, adenopathy, free fluid, or inflammatory process. No acute osseous findings.  IMPRESSION: Marked prostatic enlargement. Diffuse bladder wall thickening, can be seen  with inflammatory and neoplastic processes; correlation with cystoscopy recommended. Multiple bladder diverticula some which contain dependent calculi. 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 Mild right ureteral dilatation without hydronephrosis. Sigmoid diverticulosis. Hiatal  hernia. Bibasilar pleural effusions and atelectasis. Extensive atherosclerotic disease.  Original Report Authenticated By: Lollie Marrow, M.D.   2D Echo: Left ventricle: The cavity size was normal. Wall thickness was normal. Systolic function was hyperdynamic. The estimated ejection fraction was in the range of 70% to 75%. Wall motion was normal; there were no regional wall motion abnormalities. Doppler parameters are consistent with abnormal left ventricular relaxation (grade 1 diastolic dysfunction). - Right ventricle: Systolic function was hyperdynamic. - Pulmonary arteries: Systolic pressure was mildly increased. PA peak pressure: 44mm Hg (S).    Medications: I have reviewed the patient's current medications. Scheduled Meds:   . aspirin  81 mg Oral Daily  . Chlorhexidine Gluconate Cloth  6 each Topical Q0600  . diltiazem  120 mg Oral Daily  . dutasteride  0.5 mg Oral Daily  . furosemide  40 mg Intravenous BID  . metoprolol tartrate  25 mg Oral BID  . mupirocin ointment  1 application Nasal BID  . potassium chloride  40 mEq Oral Once  . potassium chloride  40 mEq Oral BID  . sertraline  150 mg Oral Daily  . sodium chloride  3 mL Intravenous Q12H  . Tamsulosin HCl  0.4 mg Oral QPC supper  . vancomycin  1,000 mg Intravenous Q24H  . DISCONTD: furosemide  40 mg Intravenous BID  . DISCONTD: vancomycin  1,000 mg Intravenous Q48H   Continuous Infusions:   . sodium chloride 10 mL/hr at 01/15/12 0849   PRN Meds:.acetaminophen, acetaminophen, ALPRAZolam, alum & mag hydroxide-simeth, bisacodyl, ondansetron (ZOFRAN) IV, ondansetron, oxyCODONE, polyethylene glycol  Assessment/Plan: Patient Active Problem List  Diagnosis  . Sepsis  . Acute on chronic kidney disease, stage 3  . UTI (lower urinary tract infection)  . Dehydration  . EKG abnormalities  . Hyponatremia  . Weakness generalized  . Leukocytosis  . BPH (benign prostatic hyperplasia)  . Anemia  . Acute blood loss  anemia  . Acute respiratory distress  . Hematuria  . HTN (hypertension), LVH by volts  . Bacteremia due to Staphylococcus aureus   PLAN: H/H down ? Transfuse  BP currently lower, HR controlled  Pro BNP 8047,   Hypokalemic Cr. Improved now 1.6   LOS: 4 days   INGOLD,LAURA R 01/15/2012, 12:36 PM   Patient seen and examined. Agree with assessment and plan.Patient is still tachycardic. Renal function improved with Cr 1.6. Mildly dyspneic. K 3.2, need greater replacement, check Mg. May benefit from 1 unit PRBC transfusion with IV Lasix 40 mg. Will increase metoprolol to 25 mg  every 8 hours.  Lennette Bihari, MD, [facility] 01/15/2012 1:13 PM

## 2012-01-15 NOTE — Progress Notes (Signed)
Physical Therapy Treatment Patient Details Name: Jack Stevens MRN: [id number] DOB: 05-Jul-1922 Today's Date: 01/15/2012 Time: 7564-3329 PT Time Calculation (min): 20 min  PT Assessment / Plan / Recommendation Comments on Treatment Session  pt appears weaker today but did stand and pivot. Does not appear to have enough stamina to do more than transfers. pt. will nee SNF.    Follow Up Recommendations  Skilled nursing facility    Barriers to Discharge        Equipment Recommendations  Defer to next venue    Recommendations for Other Services    Frequency Min 3X/week   Plan Discharge plan remains appropriate;Frequency remains appropriate    Precautions / Restrictions Precautions Precautions: Fall   Pertinent Vitals/Pain C/o back pain, RN to give meds.    Mobility  Bed Mobility Bed Mobility: Rolling Left Supine to Sit: 1: +2 Total assist;HOB elevated;With rails Supine to Sit: Patient Percentage: 30% Sitting - Scoot to Edge of Bed: 1: +2 Total assist Sitting - Scoot to Edge of Bed: Patient Percentage: 30% Details for Bed Mobility Assistance: Max time and effort needed for all mobility. Transfers Transfers: Sit to Stand;Stand to Sit;Stand Pivot Transfers Sit to Stand: 1: +2 Total assist Sit to Stand: Patient Percentage: 40% Stand to Sit: 3: Mod assist;With upper extremity assist Stand Pivot Transfers: 1: +2 Total assist Stand Pivot Transfers: Patient Percentage: 50% Details for Transfer Assistance: Pt required L knee to be blocked and max multimodal cues for safety during transfer to chair    Exercises     PT Diagnosis:    PT Problem List:   PT Treatment Interventions:     PT Goals Acute Rehab PT Goals Pt will go Supine/Side to Sit: with mod assist PT Goal: Supine/Side to Sit - Progress: Progressing toward goal Pt will go Sit to Stand: with mod assist PT Goal: Sit to Stand - Progress: Progressing toward goal Pt will go Stand to Sit: with mod assist;with min  assist PT Goal: Stand to Sit - Progress: Progressing toward goal Pt will Transfer Bed to Chair/Chair to Bed: with mod assist PT Transfer Goal: Bed to Chair/Chair to Bed - Progress: Progressing toward goal  Visit Information  Last PT Received On: 01/15/12 Assistance Needed: +2 PT/OT Co-Evaluation/Treatment: Yes    Subjective Data  Subjective: My back hurts   Cognition  Overall Cognitive Status: No family/caregiver present to determine baseline cognitive functioning Area of Impairment: Attention;Memory Arousal/Alertness: Awake/alert Orientation Level: Disoriented to;Place;Time;Situation Behavior During Session: [facility] for tasks performed Current Attention Level: Sustained Following Commands: Follows one step commands inconsistently;Follows one step commands with increased time    Insurance risk surveyor Sitting - Balance Support: Bilateral upper extremity supported;Feet supported Static Sitting - Level of Assistance: 4: Min assist  End of Session PT - End of Session Activity Tolerance: Patient limited by fatigue;Patient limited by pain Patient left: in chair;with call bell/phone within reach (lift pad under pt.)   GP     Rada Hay 01/15/2012, 12:49 PM

## 2012-01-15 NOTE — Progress Notes (Addendum)
[facility] PROGRESS NOTE  Jack Stevens ONG:[id number] DOB: 10-24-1921 DOA: 01/11/2012 PCP: No primary provider on file.  Assessment/Plan: Principal Problem:  *Sepsis Active Problems:  Acute on chronic kidney disease, stage 3  UTI (lower urinary tract infection)  Dehydration  EKG abnormalities  Hyponatremia  Weakness generalized  Leukocytosis  BPH (benign prostatic hyperplasia)  Anemia  Acute respiratory distress  Hematuria  HTN (hypertension), LVH by volts  Bacteremia due to Staphylococcus aureus  #1 sepsis  Likely secondary to UTI and bacteremia. Patient's tachycardia has improved on rate limiting medications and antibiotics. Respiratory rate is also improved. WBC is trending down  Lactic acid level is trending down  Pro calcitonin is trending down.  Urine cultures consistent with staph aureus. Blood cultures are pending and preliminary findings consistent with staph aureus in 2 out of 2 blood cultures..  IV vancomycin for at least 4 weeks if no TEE done. Follow- from [facility], will get midline placed (repeat BC Pending)   #2 acute respiratory distress  Likely secondary to problem #1. ABG with a pH of 7.4 PCO2 of 27 PO2 of 91 bicarbonate of 18 from 01/12/2012. Tachycardia PNA has improved. Lactic acid and pro calcitonin levels are trending down. Blood cultures and urine cultures are pending and consistent with staph aureus. IV vancomycin   #3 Probable Bacteremia  Blood cultures preliminary results growing staph aureus 2 out of 2. . Sensitivities are pending. Patient is afebrile. White count is trending down. Continue vancomycin day #4   #4 UTI  Urine cultures are pending with preliminary results showing staph aureus. IV vancomycin Follow.   #5 gross hematuria  Questionable etiology. Renal ultrasound consistent with bilateral hydronephrosis with a very heterogeneous bladder concerning for an obstructive bladder mass. Hematuria is slowly improving urine is clearing  up. Continue indwelling Foley catheter and irrigate as needed.  Urology is following and recommended cystoscopy when patient is medically stable. Per urology. CT scan abdomen and pelvis without IV contrast shows: Marked prostatic enlargement.  Diffuse bladder wall thickening, can be seen with inflammatory and  neoplastic processes; correlation with cystoscopy recommended.  Multiple bladder diverticula some which contain dependent calculi.  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  Mild right ureteral dilatation without hydronephrosis.  Sigmoid diverticulosis.  Hiatal hernia.  Bibasilar pleural effusions and atelectasis.  Extensive atherosclerotic disease.    #6 acute on chronic kidney disease stage III  Patient on discharge from [place] on 01/06/2012 and a creatinine of 1.29. Admission creatinine was 3.95. Secondary to post renal azotemia likely secondary to obstruction from BPH in the setting of volume depletion and dehydration and sepsis.  Renal function is slowly trending down. Creatinine improving. Renal ultrasound is consistent with bilateral hydronephrosis.  Follow. Urology is following and appreciate input and recommendations.   #7 dehydration  IV fluids stopped  #8 EKG changes  Patient with shortness of breath however denies any chest pain. Cardiac enzymes are negative x3. 2-D echo with EF of 70-75%. No wall motion abnormalities. Repeat EKG with T wave inversion in leads 1 aVL V4 through V6. Aspirin started per cardiology will need to monitor closely with patient's hematuria. Patient also started on diltiazem and metoprolol. Per cardiology patient will need ischemic evaluation once medically stable with probable initial Myoview scan. Cardiology is following and appreciate input and recommendations.   #9 BPH  Foley catheter. Per urology. Continue Avodart, Flomax. Patient will likely need a cystoscopy once medically stable.   #10  hyponatremia  Likely secondary to volume depletion. TSH is within normal limits.  Improving with hydration.   #11 depression/anxiety  Continue home regimen of Zoloft and Xanax.   #12 hypertension  Patient started on metoprolol and diltiazem per cardiology. Follow.   #13 severe iron deficiency anemia/acute blood loss anemia  H&H is trending down likely secondary to patient's hematuria in the setting of sepsis. Hemoglobin now is at 7.5 from 9.2. Hematuria is slowly clearing up. Transfusion threshold hemoglobin less than 7. Once patient gets over this hospitalization if he has not had a colonoscopy done may benefit from 1. May need iron supplements on discharge.   #14 prophylaxis  PPI for GI prophylaxis. SCDs for DVT prophylaxis.  #15 hypokalemia- replace   Code Status: full Family Communication: called daughter Disposition Plan: back to [facility] when cleared by Urology for 4 more weeks of IV vanc  tx out of step down later today  Consultants: Cards Urology ID  HPI/Subjective: C/o back pain No fever, no chills C/o decreased appetite   Objective: Filed Vitals:   01/15/12 0400 01/15/12 0500 01/15/12 0600 01/15/12 0800  BP: 148/65 132/62 137/62 156/78  Pulse: 76 75 80 91  Temp: 97.5 F (36.4 C)   98.7 F (37.1 C)  TempSrc: Oral   Oral  Resp: 16 15 15 20   Height:      Weight:  77.8 kg (171 lb 8.3 oz)    SpO2: 98% 98% 98% 98%    Intake/Output Summary (Last 24 hours) at 01/15/12 1610 Last data filed at 01/15/12 0800  Gross per 24 hour  Intake    380 ml  Output   2200 ml  Net  -1820 ml    Exam:  General: Alert, awake, oriented to self and place, in no acute distress.  HEENT: No bruits, no goiter.  Heart: RRR, without murmurs, rubs, gallops.  Lungs: Clear to auscultation bilaterally in anterior lung fields.  Abdomen: Soft, nontender, nondistended, positive bowel sounds.  Extremities: No clubbing cyanosis or edema with positive pedal pulses.  Neuro: Grossly  intact, nonfocal. Scrotum swelling   Data Reviewed: Basic Metabolic Panel:  Lab 01/15/12 9604 01/14/12 0322 01/13/12 0327 01/12/12 0339 01/11/12 1159  NA 136136 137 134* 129* 130*  K 3.2*3.3* 3.4* 3.6 3.7 4.8  CL 106106 109 105 99 98  CO2 2322 20 20 18* 19  GLUCOSE 118*137* 87 107* 129* 134*  BUN 41*41* 52* 58* 66* 69*  CREATININE 1.60*1.63* 1.83* 2.36* 3.14* 3.95*  CALCIUM 8.98.6 8.7 8.7 8.7 9.1  MG -- 2.0 -- -- 1.9  PHOS 2.5 3.2 -- -- --   Liver Function Tests:  Lab 01/15/12 0320 01/14/12 0322 01/13/12 0327 01/12/12 0339 01/11/12 1159  AST -- -- 16 13 18   ALT -- -- 12 9 12   ALKPHOS -- -- 81 63 67  BILITOT -- -- 0.3 0.2* 0.2*  PROT -- -- 6.2 6.6 7.2  ALBUMIN 1.8* 1.7* 1.8* 2.1* 2.4*   No results found for this basename: LIPASE:5,AMYLASE:5 in the last 168 hours No results found for this basename: AMMONIA:5 in the last 168 hours CBC:  Lab 01/15/12 0320 01/14/12 0322 01/13/12 0327 01/12/12 0339 01/11/12 1159  WBC 8.4 10.2 15.6* 17.1* 21.7*  NEUTROABS -- 9.2* 14.5* 16.3* 20.4*  HGB 7.8* 7.7* 7.5* 8.3* 9.2*  HCT 23.8* 23.1* 22.2* 24.2* 27.2*  MCV 94.1 92.4 92.9 91.7 93.8  PLT 206 203 193 241 299   Cardiac Enzymes:  Lab 01/12/12 0339 01/11/12 1947 01/11/12 1159  CKTOTAL 34 53 60  CKMB 2.0 2.2 3.2  CKMBINDEX -- -- --  TROPONINI <0.30 <0.30 <0.30   BNP (last 3 results)  Basename 01/14/12 0330  PROBNP 8047.0*   CBG: No results found for this basename: GLUCAP:5 in the last 168 hours  Recent Results (from the past 240 hour(s))  URINE CULTURE     Status: Normal   Collection Time   01/11/12 11:47 AM      Component Value Range Status Comment   Specimen Description URINE, CATHETERIZED   Final    Special Requests NONE   Final    Culture  Setup Time 01/11/2012 16:43   Final    Colony Count >=100,000 COLONIES/ML   Final    Culture     Final    Value: METHICILLIN RESISTANT STAPHYLOCOCCUS AUREUS     Note: RIFAMPIN AND GENTAMICIN SHOULD NOT BE USED AS SINGLE  DRUGS FOR TREATMENT OF STAPH INFECTIONS. CRITICAL RESULT CALLED TO, READ BACK BY AND VERIFIED WITH: PEACE DORMON @ 0622 ON 01/14/2012 HAJAM   Report Status 01/14/2012 FINAL   Final    Organism ID, Bacteria METHICILLIN RESISTANT STAPHYLOCOCCUS AUREUS   Final   CULTURE, BLOOD (ROUTINE X 2)     Status: Normal   Collection Time   01/11/12  3:03 PM      Component Value Range Status Comment   Specimen Description BLOOD RIGHT ARM  5 ML IN [facility] BOTTLE   Final    Special Requests NONE   Final    Culture  Setup Time 01/11/2012 22:25   Final    Culture     Final    Value: STAPHYLOCOCCUS AUREUS     Note: SUSCEPTIBILITIES PERFORMED ON PREVIOUS CULTURE WITHIN THE LAST 5 DAYS.     Note: Gram Stain Report Called to,Read Back By and Verified With: SHEILA MAIN @ 1746 ON 01/12/12 BY GOLLD   Report Status 01/14/2012 FINAL   Final   CULTURE, BLOOD (ROUTINE X 2)     Status: Normal   Collection Time   01/11/12  3:36 PM      Component Value Range Status Comment   Specimen Description BLOOD RIGHT HAND  3 ML IN [facility] BOTTLE   Final    Special Requests NONE   Final    Culture  Setup Time 01/11/2012 22:25   Final    Culture     Final    Value: METHICILLIN RESISTANT STAPHYLOCOCCUS AUREUS     Note: RIFAMPIN AND GENTAMICIN SHOULD NOT BE USED AS SINGLE DRUGS FOR TREATMENT OF STAPH INFECTIONS. CRITICAL RESULT CALLED TO, READ BACK BY AND VERIFIED WITH: PAM WEST BY INGRAM A 7/9 2P     Note: Gram Stain Report Called to,Read Back By and Verified With: CONRAD WHITROW 01/12/12 1050 BY SMITHERSJ   Report Status 01/14/2012 FINAL   Final    Organism ID, Bacteria METHICILLIN RESISTANT STAPHYLOCOCCUS AUREUS   Final   MRSA PCR SCREENING     Status: Abnormal   Collection Time   01/11/12  4:37 PM      Component Value Range Status Comment   MRSA by PCR POSITIVE (*) NEGATIVE Final   CULTURE, BLOOD (ROUTINE X 2)     Status: Normal (Preliminary result)   Collection Time   01/13/12  1:20 PM      Component Value Range Status Comment   Specimen  Description BLOOD RIGHT ARM   Final    Special Requests BOTTLES DRAWN AEROBIC AND ANAEROBIC 5 CC EACH  Final    Culture  Setup Time 01/13/2012 19:33   Final    Culture     Final    Value:        BLOOD CULTURE RECEIVED NO GROWTH TO DATE CULTURE WILL BE HELD FOR 5 DAYS BEFORE ISSUING A FINAL NEGATIVE REPORT   Report Status PENDING   Incomplete   CULTURE, BLOOD (ROUTINE X 2)     Status: Normal (Preliminary result)   Collection Time   01/13/12  1:25 PM      Component Value Range Status Comment   Specimen Description BLOOD RIGHT ARM   Final    Special Requests BOTTLES DRAWN AEROBIC AND ANAEROBIC 5CC EACH   Final    Culture  Setup Time 01/13/2012 19:33   Final    Culture     Final    Value:        BLOOD CULTURE RECEIVED NO GROWTH TO DATE CULTURE WILL BE HELD FOR 5 DAYS BEFORE ISSUING A FINAL NEGATIVE REPORT   Report Status PENDING   Incomplete      Studies: Ct Abdomen Pelvis Wo Contrast  01/14/2012  *RADIOLOGY REPORT*  Clinical Data: Doses question bladder mass or diverticula on ultrasound  CT ABDOMEN AND PELVIS WITHOUT CONTRAST  Technique:  Multidetector CT imaging of the abdomen and pelvis was performed following the standard protocol without intravenous contrast. Oral contrast not administered.  Sagittal and coronal MPR images reconstructed from axial data set.  Comparison: Renal ultrasound 01/12/2012  Findings: Bibasilar pleural effusions and atelectasis. Mildly complicated cyst with wall calcification lateral aspect upper pole left kidney 4.1 x 4.4 x 4.0 cm. 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 No right hydronephrosis but mild right ureteral dilatation present. Marked enlargement of prostate gland, approximately 8.0 x 6.5 x 6.3 cm. Bilateral bladder diverticula are identified anterior and superior to the ureteral orifices, containing dependent calculi. Additional diverticulum at dome of bladder containing air. Foley catheter present. Diffuse bladder  wall thickening identified, unable to exclude tumor.  Calcified granulomata in liver and spleen. Distended gallbladder. Scattered respiratory motion artifacts. No definite additional focal abnormalities of liver, spleen, pancreas, or adrenal glands. Moderate sized hiatal hernia. Extensive atherosclerotic calcifications. Normal appendix. Colonic diverticulosis. No mass, adenopathy, free fluid, or inflammatory process. No acute osseous findings.  IMPRESSION: Marked prostatic enlargement. Diffuse bladder wall thickening, can be seen with inflammatory and neoplastic processes; correlation with cystoscopy recommended. Multiple bladder diverticula some which contain dependent calculi. 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 Mild right ureteral dilatation without hydronephrosis. Sigmoid diverticulosis. Hiatal hernia. Bibasilar pleural effusions and atelectasis. Extensive atherosclerotic disease.  Original Report Authenticated By: Lollie Marrow, M.D.    Scheduled Meds:    . aspirin  81 mg Oral Daily  . Chlorhexidine Gluconate Cloth  6 each Topical Q0600  . diltiazem  120 mg Oral Daily  . dutasteride  0.5 mg Oral Daily  . furosemide  40 mg Intravenous BID  . metoprolol tartrate  25 mg Oral BID  . mupirocin ointment  1 application Nasal BID  . potassium chloride  40 mEq Oral Once  . potassium chloride  40 mEq Oral BID  . sertraline  150 mg Oral Daily  . sodium chloride  3 mL Intravenous Q12H  . Tamsulosin HCl  0.4 mg Oral QPC supper  . vancomycin  1,000 mg Intravenous Q24H  . DISCONTD: furosemide  40 mg Intravenous BID  . DISCONTD: vancomycin  1,000 mg Intravenous Q48H   Continuous Infusions:    . sodium chloride 10 mL/hr at 01/15/12 0849     Marlin Canary [facility] Pager [phone] If 8PM-8AM, please contact night-coverage [email or web address] Password [email or web address] 01/15/2012, 9:03 AM   LOS: 4 days

## 2012-01-15 NOTE — Progress Notes (Signed)
Occupational Therapy Treatment Patient Details Name: Jack Stevens MRN: [id number] DOB: 03/18/1922 Today's Date: 01/15/2012 Time: [id number] OT Time Calculation (min): 22 min  OT Assessment / Plan / Recommendation Comments on Treatment Session Pt continues to demo weakness and severe deconditioning, requiring more A for basic transfers and bed mobility today. Con't to recommend ST SNF.    Follow Up Recommendations  Skilled nursing facility    Barriers to Discharge       Equipment Recommendations  Defer to next venue    Recommendations for Other Services    Frequency Min 1X/week   Plan Discharge plan remains appropriate    Precautions / Restrictions Precautions Precautions: Fall   Pertinent Vitals/Pain Stable throughout    ADL  Eating/Feeding: Performed;Minimal assistance Where Assessed - Eating/Feeding: Chair Grooming: Performed;Wash/dry face;Minimal assistance Where Assessed - Grooming: Unsupported sitting Toilet Transfer: Simulated;+2 Total assistance Toilet Transfer: Patient Percentage: 40% Toilet Transfer Method: Stand pivot Acupuncturist: Other (comment) (to recliner) Transfers/Ambulation Related to ADLs: Pt was attempting to feed self but could not successfully utilize utensils without max spillage. Informed RN pt will need A for all meals. ADL Comments: Pt fatigues quickly, c/o back pain throughout. Extremely weak and deconditioned.    OT Diagnosis:    OT Problem List:   OT Treatment Interventions:     OT Goals ADL Goals Pt Will Perform Eating: with set-up;Supported;Sitting, chair (with a min amt of spillage.) ADL Goal: Eating - Progress: Goal set today ADL Goal: Grooming - Progress: Progressing toward goals ADL Goal: Toilet Transfer - Progress: Not progressing  Visit Information  Last OT Received On: 01/15/12 Assistance Needed: +2 PT/OT Co-Evaluation/Treatment: Yes    Subjective Data  Subjective: My back hurts.   Prior Functioning       Cognition  Overall Cognitive Status: No family/caregiver present to determine baseline cognitive functioning Area of Impairment: Attention;Memory Arousal/Alertness: Awake/alert Orientation Level: Disoriented to;Place;Time;Situation Behavior During Session: [facility] for tasks performed Current Attention Level: Sustained Following Commands: Follows one step commands inconsistently;Follows one step commands with increased time    Mobility Bed Mobility Bed Mobility: Rolling Left Supine to Sit: 1: +2 Total assist;HOB elevated;With rails Supine to Sit: Patient Percentage: 30% Sitting - Scoot to Edge of Bed: 1: +2 Total assist Sitting - Scoot to Edge of Bed: Patient Percentage: 30% Details for Bed Mobility Assistance: Max time and effort needed for all mobility. Transfers Sit to Stand: 1: +2 Total assist;From bed;With upper extremity assist Sit to Stand: Patient Percentage: 40% Details for Transfer Assistance: Pt required L knee to be blocked and max multimodal cues for safety during transfer to chair.   Exercises    Balance Static Sitting Balance Static Sitting - Balance Support: Bilateral upper extremity supported;Feet supported Static Sitting - Level of Assistance: 4: Min assist  End of Session OT - End of Session Activity Tolerance: Patient limited by fatigue;Patient limited by pain Patient left: in chair;with call bell/phone within reach  GO     Janaiya Beauchesne A OTR/L [phone] 01/15/2012, 12:30 PM

## 2012-01-15 NOTE — Progress Notes (Signed)
UR complete 

## 2012-01-15 NOTE — Progress Notes (Signed)
Subjective: Patient reports no acute distress  Objective: Vital signs in last 24 hours: Temp:  [97.5 F (36.4 C)-100.3 F (37.9 C)] 98.6 F (37 C) (07/11 1732) Pulse Rate:  [71-98] 71  (07/11 1732) Resp:  [15-27] 22  (07/11 1100) BP: (112-156)/(49-78) 125/68 mmHg (07/11 1732) SpO2:  [92 %-98 %] 92 % (07/11 1732) Weight:  [167 lb 5.3 oz (75.9 kg)-171 lb 8.3 oz (77.8 kg)] 167 lb 5.3 oz (75.9 kg) (07/11 1732)  Intake/Output from previous day: 07/10 0701 - 07/11 0700 In: 410 [I.V.:410] Out: 2575 [Urine:2575] Intake/Output this shift: Total I/O In: 300 [I.V.:100; IV Piggyback:200] Out: 2000 [Urine:2000]  Physical Exam:  Lungs - Normal respiratory effort, chest expands symmetrically.  Abdomen - Soft, non-tender & non-distended Foley draining clear urine. CT scan: left duplicated system with left hydronephrosis. Right hydronephrosis is resolved. Thickened bladder wall with diverticula and bladder calculi. Cr: 1.6 Lab Results:  Basename 01/15/12 0320 01/14/12 0322 01/13/12 0327  HGB 7.8* 7.7* 7.5*  HCT 23.8* 23.1* 22.2*   BMET  Basename 01/15/12 0320 01/14/12 0322  NA [id number] 137  K 3.2*3.3* 3.4*  CL 106106 109  CO2 2322 20  GLUCOSE 118*137* 87  BUN 41*41* 52*  CREATININE 1.60*1.63* 1.83*  CALCIUM 8.98.6 8.7   No results found for this basename: LABPT:3,INR:3 in the last 72 hours No results found for this basename: LABURIN:1 in the last 72 hours Results for orders placed during the hospital encounter of 01/11/12  URINE CULTURE     Status: Normal   Collection Time   01/11/12 11:47 AM      Component Value Range Status Comment   Specimen Description URINE, CATHETERIZED   Final    Special Requests NONE   Final    Culture  Setup Time 01/11/2012 16:43   Final    Colony Count >=100,000 COLONIES/ML   Final    Culture     Final    Value: METHICILLIN RESISTANT STAPHYLOCOCCUS AUREUS     Note: RIFAMPIN AND GENTAMICIN SHOULD NOT BE USED AS SINGLE DRUGS FOR TREATMENT OF  STAPH INFECTIONS. CRITICAL RESULT CALLED TO, READ BACK BY AND VERIFIED WITH: PEACE DORMON @ 0622 ON 01/14/2012 HAJAM   Report Status 01/14/2012 FINAL   Final    Organism ID, Bacteria METHICILLIN RESISTANT STAPHYLOCOCCUS AUREUS   Final   CULTURE, BLOOD (ROUTINE X 2)     Status: Normal   Collection Time   01/11/12  3:03 PM      Component Value Range Status Comment   Specimen Description BLOOD RIGHT ARM  5 ML IN [facility] BOTTLE   Final    Special Requests NONE   Final    Culture  Setup Time 01/11/2012 22:25   Final    Culture     Final    Value: STAPHYLOCOCCUS AUREUS     Note: SUSCEPTIBILITIES PERFORMED ON PREVIOUS CULTURE WITHIN THE LAST 5 DAYS.     Note: Gram Stain Report Called to,Read Back By and Verified With: SHEILA MAIN @ 1746 ON 01/12/12 BY GOLLD   Report Status 01/14/2012 FINAL   Final   CULTURE, BLOOD (ROUTINE X 2)     Status: Normal   Collection Time   01/11/12  3:36 PM      Component Value Range Status Comment   Specimen Description BLOOD RIGHT HAND  3 ML IN [facility] BOTTLE   Final    Special Requests NONE   Final    Culture  Setup Time 01/11/2012 22:25   Final  Culture     Final    Value: METHICILLIN RESISTANT STAPHYLOCOCCUS AUREUS     Note: RIFAMPIN AND GENTAMICIN SHOULD NOT BE USED AS SINGLE DRUGS FOR TREATMENT OF STAPH INFECTIONS. CRITICAL RESULT CALLED TO, READ BACK BY AND VERIFIED WITH: PAM WEST BY INGRAM A 7/9 2P     Note: Gram Stain Report Called to,Read Back By and Verified With: CONRAD WHITROW 01/12/12 1050 BY SMITHERSJ   Report Status 01/14/2012 FINAL   Final    Organism ID, Bacteria METHICILLIN RESISTANT STAPHYLOCOCCUS AUREUS   Final   MRSA PCR SCREENING     Status: Abnormal   Collection Time   01/11/12  4:37 PM      Component Value Range Status Comment   MRSA by PCR POSITIVE (*) NEGATIVE Final   CULTURE, BLOOD (ROUTINE X 2)     Status: Normal (Preliminary result)   Collection Time   01/13/12  1:20 PM      Component Value Range Status Comment   Specimen Description BLOOD RIGHT  ARM   Final    Special Requests BOTTLES DRAWN AEROBIC AND ANAEROBIC 5 CC EACH   Final    Culture  Setup Time 01/13/2012 19:33   Final    Culture     Final    Value:        BLOOD CULTURE RECEIVED NO GROWTH TO DATE CULTURE WILL BE HELD FOR 5 DAYS BEFORE ISSUING A FINAL NEGATIVE REPORT   Report Status PENDING   Incomplete   CULTURE, BLOOD (ROUTINE X 2)     Status: Normal (Preliminary result)   Collection Time   01/13/12  1:25 PM      Component Value Range Status Comment   Specimen Description BLOOD RIGHT ARM   Final    Special Requests BOTTLES DRAWN AEROBIC AND ANAEROBIC 5CC EACH   Final    Culture  Setup Time 01/13/2012 19:33   Final    Culture     Final    Value:        BLOOD CULTURE RECEIVED NO GROWTH TO DATE CULTURE WILL BE HELD FOR 5 DAYS BEFORE ISSUING A FINAL NEGATIVE REPORT   Report Status PENDING   Incomplete     Studies/Results: Ct Abdomen Pelvis Wo Contrast  01/14/2012  *RADIOLOGY REPORT*  Clinical Data: Doses question bladder mass or diverticula on ultrasound  CT ABDOMEN AND PELVIS WITHOUT CONTRAST  Technique:  Multidetector CT imaging of the abdomen and pelvis was performed following the standard protocol without intravenous contrast. Oral contrast not administered.  Sagittal and coronal MPR images reconstructed from axial data set.  Comparison: Renal ultrasound 01/12/2012  Findings: Bibasilar pleural effusions and atelectasis. Mildly complicated cyst with wall calcification lateral aspect upper pole left kidney 4.1 x 4.4 x 4.0 cm. 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 No right hydronephrosis but mild right ureteral dilatation present. Marked enlargement of prostate gland, approximately 8.0 x 6.5 x 6.3 cm. Bilateral bladder diverticula are identified anterior and superior to the ureteral orifices, containing dependent calculi. Additional diverticulum at dome of bladder containing air. Foley catheter present. Diffuse bladder wall thickening  identified, unable to exclude tumor.  Calcified granulomata in liver and spleen. Distended gallbladder. Scattered respiratory motion artifacts. No definite additional focal abnormalities of liver, spleen, pancreas, or adrenal glands. Moderate sized hiatal hernia. Extensive atherosclerotic calcifications. Normal appendix. Colonic diverticulosis. No mass, adenopathy, free fluid, or inflammatory process. No acute osseous findings.  IMPRESSION: Marked prostatic enlargement. Diffuse bladder wall thickening,  can be seen with inflammatory and neoplastic processes; correlation with cystoscopy recommended. Multiple bladder diverticula some which contain dependent calculi. 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 Mild right ureteral dilatation without hydronephrosis. Sigmoid diverticulosis. Hiatal hernia. Bibasilar pleural effusions and atelectasis. Extensive atherosclerotic disease.  Original Report Authenticated By: Lollie Marrow, M.D.    Assessment/Plan:  Gross hematuria, BPH, Urinary retention, Renal insufficiency, Bladder calculi  Leave Foley indwelling.  Cysto when medically stable.  Can be done as outpatient if patient is ready for discharge.   LOS: 4 days   Jack Stevens 01/15/2012, 6:59 PM

## 2012-01-15 NOTE — Progress Notes (Signed)
INFECTIOUS DISEASE PROGRESS NOTE  ID: Jack Stevens is a 78 y.o. male with   Principal Problem:  *Sepsis Active Problems:  Acute on chronic kidney disease, stage 3  UTI (lower urinary tract infection)  Dehydration  EKG abnormalities  Hyponatremia  Weakness generalized  Leukocytosis  BPH (benign prostatic hyperplasia)  Anemia  Acute respiratory distress  Hematuria  HTN (hypertension), LVH by volts  Bacteremia due to Staphylococcus aureus  Subjective: Without complaint  Abtx:  Anti-infectives     Start     Dose/Rate Route Frequency Ordered Stop   01/15/12 1000   vancomycin (VANCOCIN) IVPB 1000 mg/200 mL premix        1,000 mg 200 mL/hr over 60 Minutes Intravenous Every 24 hours 01/14/12 1257     01/12/12 1400   piperacillin-tazobactam (ZOSYN) IVPB 2.25 g  Status:  Discontinued        2.25 g 100 mL/hr over 30 Minutes Intravenous Every 6 hours 01/12/12 1302 01/13/12 1158   01/12/12 1045   vancomycin (VANCOCIN) IVPB 1000 mg/200 mL premix  Status:  Discontinued        1,000 mg 200 mL/hr over 60 Minutes Intravenous Every 48 hours 01/12/12 1009 01/14/12 1257   01/12/12 0000   piperacillin-tazobactam (ZOSYN) IVPB 2.25 g  Status:  Discontinued        2.25 g 100 mL/hr over 30 Minutes Intravenous Every 8 hours 01/11/12 1553 01/12/12 1302   01/11/12 1600  piperacillin-tazobactam (ZOSYN) IVPB 2.25 g       2.25 g 100 mL/hr over 30 Minutes Intravenous STAT 01/11/12 1546 01/11/12 1705   01/11/12 1300   cefTRIAXone (ROCEPHIN) 1 g in dextrose 5 % 50 mL IVPB        1 g 100 mL/hr over 30 Minutes Intravenous  Once 01/11/12 1258 01/11/12 1333          Medications:  Scheduled:   . aspirin  81 mg Oral Daily  . Chlorhexidine Gluconate Cloth  6 each Topical Q0600  . diltiazem  120 mg Oral Daily  . dutasteride  0.5 mg Oral Daily  . furosemide  40 mg Intravenous BID  . furosemide  40 mg Intravenous Once  . metoprolol tartrate  25 mg Oral TID  . mupirocin ointment  1 application  Nasal BID  . potassium chloride  40 mEq Oral Once  . potassium chloride  40 mEq Oral BID  . sertraline  150 mg Oral Daily  . sodium chloride  3 mL Intravenous Q12H  . Tamsulosin HCl  0.4 mg Oral QPC supper  . vancomycin  1,000 mg Intravenous Q24H  . DISCONTD: furosemide  40 mg Intravenous BID  . DISCONTD: metoprolol tartrate  25 mg Oral BID    Objective: Vital signs in last 24 hours: Temp:  [97.5 F (36.4 C)-100.3 F (37.9 C)] 98.7 F (37.1 C) (07/11 0800) Pulse Rate:  [73-98] 87  (07/11 1100) Resp:  [15-30] 22  (07/11 1100) BP: (112-156)/(49-78) 112/54 mmHg (07/11 1100) SpO2:  [92 %-99 %] 92 % (07/11 1100) Weight:  [77.8 kg (171 lb 8.3 oz)] 77.8 kg (171 lb 8.3 oz) (07/11 0500)   General appearance: alert, cooperative and mild distress Resp: clear to auscultation bilaterally Cardio: tachycardic GI: normal findings: bowel sounds normal and soft, non-tender tachypnea  Lab Results  Basename 01/15/12 0320 01/14/12 0322  WBC 8.4 10.2  HGB 7.8* 7.7*  HCT 23.8* 23.1*  NA 136136 137  K 3.2*3.3* 3.4*  CL 106106 109  CO2 2322 20  BUN  41*41* 52*  CREATININE 1.60*1.63* 1.83*  GLU -- --   Liver Panel  Basename 01/15/12 0320 01/14/12 0322 01/13/12 0327  PROT -- -- 6.2  ALBUMIN 1.8* 1.7* --  AST -- -- 16  ALT -- -- 12  ALKPHOS -- -- 81  BILITOT -- -- 0.3  BILIDIR -- -- --  IBILI -- -- --   Sedimentation Rate No results found for this basename: ESRSEDRATE in the last 72 hours C-Reactive Protein No results found for this basename: CRP:2 in the last 72 hours  Microbiology: Recent Results (from the past 240 hour(s))  URINE CULTURE     Status: Normal   Collection Time   01/11/12 11:47 AM      Component Value Range Status Comment   Specimen Description URINE, CATHETERIZED   Final    Special Requests NONE   Final    Culture  Setup Time 01/11/2012 16:43   Final    Colony Count >=100,000 COLONIES/ML   Final    Culture     Final    Value: METHICILLIN RESISTANT  STAPHYLOCOCCUS AUREUS     Note: RIFAMPIN AND GENTAMICIN SHOULD NOT BE USED AS SINGLE DRUGS FOR TREATMENT OF STAPH INFECTIONS. CRITICAL RESULT CALLED TO, READ BACK BY AND VERIFIED WITH: PEACE DORMON @ 0622 ON 01/14/2012 HAJAM   Report Status 01/14/2012 FINAL   Final    Organism ID, Bacteria METHICILLIN RESISTANT STAPHYLOCOCCUS AUREUS   Final   CULTURE, BLOOD (ROUTINE X 2)     Status: Normal   Collection Time   01/11/12  3:03 PM      Component Value Range Status Comment   Specimen Description BLOOD RIGHT ARM  5 ML IN [facility] BOTTLE   Final    Special Requests NONE   Final    Culture  Setup Time 01/11/2012 22:25   Final    Culture     Final    Value: STAPHYLOCOCCUS AUREUS     Note: SUSCEPTIBILITIES PERFORMED ON PREVIOUS CULTURE WITHIN THE LAST 5 DAYS.     Note: Gram Stain Report Called to,Read Back By and Verified With: SHEILA MAIN @ 1746 ON 01/12/12 BY GOLLD   Report Status 01/14/2012 FINAL   Final   CULTURE, BLOOD (ROUTINE X 2)     Status: Normal   Collection Time   01/11/12  3:36 PM      Component Value Range Status Comment   Specimen Description BLOOD RIGHT HAND  3 ML IN [facility] BOTTLE   Final    Special Requests NONE   Final    Culture  Setup Time 01/11/2012 22:25   Final    Culture     Final    Value: METHICILLIN RESISTANT STAPHYLOCOCCUS AUREUS     Note: RIFAMPIN AND GENTAMICIN SHOULD NOT BE USED AS SINGLE DRUGS FOR TREATMENT OF STAPH INFECTIONS. CRITICAL RESULT CALLED TO, READ BACK BY AND VERIFIED WITH: PAM WEST BY INGRAM A 7/9 2P     Note: Gram Stain Report Called to,Read Back By and Verified With: CONRAD WHITROW 01/12/12 1050 BY SMITHERSJ   Report Status 01/14/2012 FINAL   Final    Organism ID, Bacteria METHICILLIN RESISTANT STAPHYLOCOCCUS AUREUS   Final   MRSA PCR SCREENING     Status: Abnormal   Collection Time   01/11/12  4:37 PM      Component Value Range Status Comment   MRSA by PCR POSITIVE (*) NEGATIVE Final   CULTURE, BLOOD (ROUTINE X 2)     Status: Normal (Preliminary result)  Collection Time   01/13/12  1:20 PM      Component Value Range Status Comment   Specimen Description BLOOD RIGHT ARM   Final    Special Requests BOTTLES DRAWN AEROBIC AND ANAEROBIC 5 CC EACH   Final    Culture  Setup Time 01/13/2012 19:33   Final    Culture     Final    Value:        BLOOD CULTURE RECEIVED NO GROWTH TO DATE CULTURE WILL BE HELD FOR 5 DAYS BEFORE ISSUING A FINAL NEGATIVE REPORT   Report Status PENDING   Incomplete   CULTURE, BLOOD (ROUTINE X 2)     Status: Normal (Preliminary result)   Collection Time   01/13/12  1:25 PM      Component Value Range Status Comment   Specimen Description BLOOD RIGHT ARM   Final    Special Requests BOTTLES DRAWN AEROBIC AND ANAEROBIC 5CC EACH   Final    Culture  Setup Time 01/13/2012 19:33   Final    Culture     Final    Value:        BLOOD CULTURE RECEIVED NO GROWTH TO DATE CULTURE WILL BE HELD FOR 5 DAYS BEFORE ISSUING A FINAL NEGATIVE REPORT   Report Status PENDING   Incomplete     Studies/Results: Ct Abdomen Pelvis Wo Contrast  01/14/2012  *RADIOLOGY REPORT*  Clinical Data: Doses question bladder mass or diverticula on ultrasound  CT ABDOMEN AND PELVIS WITHOUT CONTRAST  Technique:  Multidetector CT imaging of the abdomen and pelvis was performed following the standard protocol without intravenous contrast. Oral contrast not administered.  Sagittal and coronal MPR images reconstructed from axial data set.  Comparison: Renal ultrasound 01/12/2012  Findings: Bibasilar pleural effusions and atelectasis. Mildly complicated cyst with wall calcification lateral aspect upper pole left kidney 4.1 x 4.4 x 4.0 cm. 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 No right hydronephrosis but mild right ureteral dilatation present. Marked enlargement of prostate gland, approximately 8.0 x 6.5 x 6.3 cm. Bilateral bladder diverticula are identified anterior and superior to the ureteral orifices, containing dependent calculi.  Additional diverticulum at dome of bladder containing air. Foley catheter present. Diffuse bladder wall thickening identified, unable to exclude tumor.  Calcified granulomata in liver and spleen. Distended gallbladder. Scattered respiratory motion artifacts. No definite additional focal abnormalities of liver, spleen, pancreas, or adrenal glands. Moderate sized hiatal hernia. Extensive atherosclerotic calcifications. Normal appendix. Colonic diverticulosis. No mass, adenopathy, free fluid, or inflammatory process. No acute osseous findings.  IMPRESSION: Marked prostatic enlargement. Diffuse bladder wall thickening, can be seen with inflammatory and neoplastic processes; correlation with cystoscopy recommended. Multiple bladder diverticula some which contain dependent calculi. 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 Mild right ureteral dilatation without hydronephrosis. Sigmoid diverticulosis. Hiatal hernia. Bibasilar pleural effusions and atelectasis. Extensive atherosclerotic disease.  Original Report Authenticated By: Lollie Marrow, M.D.     Assessment/Plan: MRSA bacteremia  Urosepsis  Day 4 Vancomycin  Prostate enlargement  ARF  Would-  rec TEE (TTE is only 50% sensitive per 2003 and 2004 literature. ?If this has been updated...)  Continue vancomycin  Repeat BCx NGTD so far If TEE not done, plan for prolonged treatment (~4 weeks)  Cr improving, slightly over last 24h Defer to uro on plans for obstruction   Johny Sax Infectious Diseases [phone] 01/15/2012, 1:51 PM   LOS: 4 days

## 2012-01-15 NOTE — Care Management Note (Signed)
    Page 1 of 1   01/15/2012     2:09:46 PM   CARE MANAGEMENT NOTE 01/15/2012  Patient:  TREMAINE, EARWOOD   Account Number:  [id number]  Date Initiated:  01/15/2012  Documentation initiated by:  Lorenda Ishihara  Subjective/Objective Assessment:   78 yo male admitted with sepsis. PTA lived at [facility]     Action/Plan:   Return to SNF when stable   Anticipated DC Date:  01/16/2012   Anticipated DC Plan:  SKILLED NURSING FACILITY  In-house referral  Clinical Social Worker      DC Planning Services  CM consult      Choice offered to / List presented to:             Status of service:  Completed, signed off Medicare Important Message given?   (If response is "NO", the following Medicare IM given date fields will be blank) Date Medicare IM given:   Date Additional Medicare IM given:    Discharge Disposition:  SKILLED NURSING FACILITY  Per UR Regulation:  Reviewed for med. necessity/level of care/duration of stay  If discussed at Long Length of Stay Meetings, dates discussed:    Comments:

## 2012-01-16 ENCOUNTER — Inpatient Hospital Stay (HOSPITAL_COMMUNITY): Payer: Medicare Other

## 2012-01-16 DIAGNOSIS — M549 Dorsalgia, unspecified: Secondary | ICD-10-CM

## 2012-01-16 LAB — RENAL FUNCTION PANEL
BUN: 39 mg/dL — ABNORMAL HIGH (ref 6–23)
Chloride: 98 mEq/L (ref 96–112)
Creatinine, Ser: 1.5 mg/dL — ABNORMAL HIGH (ref 0.50–1.35)
Glucose, Bld: 98 mg/dL (ref 70–99)
Phosphorus: 2.9 mg/dL (ref 2.3–4.6)
Potassium: 3.4 mEq/L — ABNORMAL LOW (ref 3.5–5.1)

## 2012-01-16 LAB — TYPE AND SCREEN
ABO/RH(D): A POS
Unit division: 0

## 2012-01-16 LAB — CBC
HCT: 28 % — ABNORMAL LOW (ref 39.0–52.0)
MCH: 29.9 pg (ref 26.0–34.0)
MCV: 90.9 fL (ref 78.0–100.0)
RBC: 3.08 MIL/uL — ABNORMAL LOW (ref 4.22–5.81)
WBC: 9.5 10*3/uL (ref 4.0–10.5)

## 2012-01-16 MED ORDER — POLYETHYLENE GLYCOL 3350 17 G PO PACK
17.0000 g | PACK | Freq: Every day | ORAL | Status: DC
  Administered 2012-01-16 – 2012-01-19 (×4): 17 g via ORAL
  Filled 2012-01-16 (×4): qty 1

## 2012-01-16 MED ORDER — POTASSIUM CHLORIDE CRYS ER 20 MEQ PO TBCR
40.0000 meq | EXTENDED_RELEASE_TABLET | Freq: Once | ORAL | Status: AC
  Administered 2012-01-16: 40 meq via ORAL
  Filled 2012-01-16: qty 2

## 2012-01-16 NOTE — Progress Notes (Signed)
Subjective: Patient reports: No pain  Objective: Vital signs in last 24 hours: Temp:  [97.5 F (36.4 C)-99.7 F (37.6 C)] 98.4 F (36.9 C) (07/12 0630) Pulse Rate:  [65-105] 72  (07/12 0630) Resp:  [16-22] 20  (07/12 0630) BP: (112-161)/(52-83) 135/82 mmHg (07/12 0630) SpO2:  [92 %-93 %] 93 % (07/12 0630) Weight:  [166 lb 10.7 oz (75.6 kg)-167 lb 5.3 oz (75.9 kg)] 166 lb 10.7 oz (75.6 kg) (07/12 0630)  Intake/Output from previous day: 07/11 0701 - 07/12 0700 In: 807.5 [P.O.:120; I.V.:150; Blood:337.5; IV Piggyback:200] Out: 3500 [Urine:3500] Intake/Output this shift:    Physical Exam:  General: Lungs - Normal respiratory effort, chest expands symmetrically.  Abdomen - Soft, non-tender & non-distended Urine clear.  No clots. Scrotal swelling decreasing. Cr: 1.50  Lab Results:  Basename 01/16/12 0355 01/15/12 0320 01/14/12 0322  HGB 9.2* 7.8* 7.7*  HCT 28.0* 23.8* 23.1*   BMET  Basename 01/16/12 0355 01/15/12 0320  NA 133* 136136  K 3.4* 3.2*3.3*  CL 98 106106  CO2 25 2322  GLUCOSE 98 118*137*  BUN 39* 41*41*  CREATININE 1.50* 1.60*1.63*  CALCIUM 8.9 8.98.6   No results found for this basename: LABPT:3,INR:3 in the last 72 hours No results found for this basename: LABURIN:1 in the last 72 hours Results for orders placed during the hospital encounter of 01/11/12  URINE CULTURE     Status: Normal   Collection Time   01/11/12 11:47 AM      Component Value Range Status Comment   Specimen Description URINE, CATHETERIZED   Final    Special Requests NONE   Final    Culture  Setup Time 01/11/2012 16:43   Final    Colony Count >=100,000 COLONIES/ML   Final    Culture     Final    Value: METHICILLIN RESISTANT STAPHYLOCOCCUS AUREUS     Note: RIFAMPIN AND GENTAMICIN SHOULD NOT BE USED AS SINGLE DRUGS FOR TREATMENT OF STAPH INFECTIONS. CRITICAL RESULT CALLED TO, READ BACK BY AND VERIFIED WITH: PEACE DORMON @ 0622 ON 01/14/2012 HAJAM   Report Status 01/14/2012  FINAL   Final    Organism ID, Bacteria METHICILLIN RESISTANT STAPHYLOCOCCUS AUREUS   Final   CULTURE, BLOOD (ROUTINE X 2)     Status: Normal   Collection Time   01/11/12  3:03 PM      Component Value Range Status Comment   Specimen Description BLOOD RIGHT ARM  5 ML IN [facility] BOTTLE   Final    Special Requests NONE   Final    Culture  Setup Time 01/11/2012 22:25   Final    Culture     Final    Value: STAPHYLOCOCCUS AUREUS     Note: SUSCEPTIBILITIES PERFORMED ON PREVIOUS CULTURE WITHIN THE LAST 5 DAYS.     Note: Gram Stain Report Called to,Read Back By and Verified With: SHEILA MAIN @ 1746 ON 01/12/12 BY GOLLD   Report Status 01/14/2012 FINAL   Final   CULTURE, BLOOD (ROUTINE X 2)     Status: Normal   Collection Time   01/11/12  3:36 PM      Component Value Range Status Comment   Specimen Description BLOOD RIGHT HAND  3 ML IN [facility] BOTTLE   Final    Special Requests NONE   Final    Culture  Setup Time 01/11/2012 22:25   Final    Culture     Final    Value: METHICILLIN RESISTANT STAPHYLOCOCCUS AUREUS  Note: RIFAMPIN AND GENTAMICIN SHOULD NOT BE USED AS SINGLE DRUGS FOR TREATMENT OF STAPH INFECTIONS. CRITICAL RESULT CALLED TO, READ BACK BY AND VERIFIED WITH: PAM WEST BY INGRAM A 7/9 2P     Note: Gram Stain Report Called to,Read Back By and Verified With: CONRAD WHITROW 01/12/12 1050 BY SMITHERSJ   Report Status 01/14/2012 FINAL   Final    Organism ID, Bacteria METHICILLIN RESISTANT STAPHYLOCOCCUS AUREUS   Final   MRSA PCR SCREENING     Status: Abnormal   Collection Time   01/11/12  4:37 PM      Component Value Range Status Comment   MRSA by PCR POSITIVE (*) NEGATIVE Final   CULTURE, BLOOD (ROUTINE X 2)     Status: Normal (Preliminary result)   Collection Time   01/13/12  1:20 PM      Component Value Range Status Comment   Specimen Description BLOOD RIGHT ARM   Final    Special Requests BOTTLES DRAWN AEROBIC AND ANAEROBIC 5 CC EACH   Final    Culture  Setup Time 01/13/2012 19:33   Final     Culture     Final    Value: GRAM POSITIVE COCCI IN CLUSTERS     Note: Gram Stain Report Called to,Read Back By and Verified With: CHRISTY @ 2334 ON 01/15/12 BY GOLLD   Report Status PENDING   Incomplete   CULTURE, BLOOD (ROUTINE X 2)     Status: Normal (Preliminary result)   Collection Time   01/13/12  1:25 PM      Component Value Range Status Comment   Specimen Description BLOOD RIGHT ARM   Final    Special Requests BOTTLES DRAWN AEROBIC AND ANAEROBIC 5CC EACH   Final    Culture  Setup Time 01/13/2012 19:33   Final    Culture     Final    Value:        BLOOD CULTURE RECEIVED NO GROWTH TO DATE CULTURE WILL BE HELD FOR 5 DAYS BEFORE ISSUING A FINAL NEGATIVE REPORT   Report Status PENDING   Incomplete     Studies/Results: Ct Abdomen Pelvis Wo Contrast  01/14/2012  *RADIOLOGY REPORT*  Clinical Data: Doses question bladder mass or diverticula on ultrasound  CT ABDOMEN AND PELVIS WITHOUT CONTRAST  Technique:  Multidetector CT imaging of the abdomen and pelvis was performed following the standard protocol without intravenous contrast. Oral contrast not administered.  Sagittal and coronal MPR images reconstructed from axial data set.  Comparison: Renal ultrasound 01/12/2012  Findings: Bibasilar pleural effusions and atelectasis. Mildly complicated cyst with wall calcification lateral aspect upper pole left kidney 4.1 x 4.4 x 4.0 cm. 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 No right hydronephrosis but mild right ureteral dilatation present. Marked enlargement of prostate gland, approximately 8.0 x 6.5 x 6.3 cm. Bilateral bladder diverticula are identified anterior and superior to the ureteral orifices, containing dependent calculi. Additional diverticulum at dome of bladder containing air. Foley catheter present. Diffuse bladder wall thickening identified, unable to exclude tumor.  Calcified granulomata in liver and spleen. Distended gallbladder. Scattered  respiratory motion artifacts. No definite additional focal abnormalities of liver, spleen, pancreas, or adrenal glands. Moderate sized hiatal hernia. Extensive atherosclerotic calcifications. Normal appendix. Colonic diverticulosis. No mass, adenopathy, free fluid, or inflammatory process. No acute osseous findings.  IMPRESSION: Marked prostatic enlargement. Diffuse bladder wall thickening, can be seen with inflammatory and neoplastic processes; correlation with cystoscopy recommended. Multiple bladder diverticula some which contain  dependent calculi. 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 Mild right ureteral dilatation without hydronephrosis. Sigmoid diverticulosis. Hiatal hernia. Bibasilar pleural effusions and atelectasis. Extensive atherosclerotic disease.  Original Report Authenticated By: Lollie Marrow, M.D.    Assessment/Plan:  Gross hematuria. Renal insufficiency. BPH.  Urinary retention. Bladder calculi  Leave Foley indwelling  Can go home with Foley  Cysto as outpatient after discharge   LOS: 5 days   Jack Stevens 01/16/2012, 8:09 AM

## 2012-01-16 NOTE — Progress Notes (Signed)
Pt. Seen and examined. Agree with the NP/PA-C note as written. Renal function continues to improve. He is net negative almost 6L. S/p transfusion with improved hemoglobin. Telemetry with infrequent missed beats .Marland Kitchen ?blocked PAC's or sinus exit block. He is asymptomatic. Will add BNP to am labs.   Chrystie Nose, MD, [facility] Attending Cardiologist [facility]

## 2012-01-16 NOTE — Progress Notes (Signed)
CRITICAL VALUE ALERT  Critical value received:   Blood cultures, aerobic bottle- Gram positive Cocci in clusters  Date of notification:  01-15-2012   Time of notification:  2315  Critical value read back:yes  Nurse who received alert:  C. Gerilyn Pilgrim  MD notified (1st page):  L. Harduk   Time of first page:  0135 am  MD notified (2nd page):  Time of second page:  Responding MD:  L.Harduk   Time MD responded:  0143 am

## 2012-01-16 NOTE — Progress Notes (Signed)
[facility] PROGRESS NOTE  Jack Stevens NGE:[id number] DOB: 05/21/1922 DOA: 01/11/2012 PCP: No primary provider on file.  Assessment/Plan: Principal Problem:  *Sepsis Active Problems:  Acute on chronic kidney disease, stage 3  UTI (lower urinary tract infection)  Dehydration  EKG abnormalities  Hyponatremia  Weakness generalized  Leukocytosis  BPH (benign prostatic hyperplasia)  Anemia  Acute respiratory distress  Hematuria  HTN (hypertension), LVH by volts  Bacteremia due to Staphylococcus aureus  #1 sepsis  Likely secondary to UTI and bacteremia. Patient's tachycardia has improved on rate limiting medications and antibiotics. Respiratory rate is also improved. WBC is trending down  Lactic acid level is trending down  Pro calcitonin is trending down.  Urine cultures consistent with staph aureus. Blood cultures are pending and preliminary findings consistent with staph aureus in 2 out of 2 blood cultures..  IV vancomycin for at least 4 weeks if no TEE done. Follow- from [facility], will get PICC  Placed (may need to hold off until blood cultures negative) (repeat BC- 1/2 + for staph a)   #2 acute respiratory distress  Likely secondary to problem #1. ABG with a pH of 7.4 PCO2 of 27 PO2 of 91 bicarbonate of 18 from 01/12/2012. Tachycardia PNA has improved. Lactic acid and pro calcitonin levels are trending down. Blood cultures and urine cultures are pending and consistent with staph aureus. IV vancomycin   #3 Probable Bacteremia  Blood cultures preliminary results growing staph aureus 2 out of 2. . Sensitivities are pending. Patient is afebrile. White count is trending down. Continue vancomycin day #4   #4 UTI  Urine cultures are pending with preliminary results showing staph aureus. IV vancomycin Follow.   #5 gross hematuria  Questionable etiology. Renal ultrasound consistent with bilateral hydronephrosis with a very heterogeneous bladder concerning for an obstructive  bladder mass. Hematuria is slowly improving urine is clearing up. Continue indwelling Foley catheter and irrigate as needed.  Urology is following and recommended cystoscopy when patient is medically stable. Per urology. CT scan abdomen and pelvis without IV contrast shows: Marked prostatic enlargement.  Diffuse bladder wall thickening, can be seen with inflammatory and  neoplastic processes; correlation with cystoscopy recommended.  Multiple bladder diverticula some which contain dependent calculi.  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  Mild right ureteral dilatation without hydronephrosis.  Sigmoid diverticulosis.  Hiatal hernia.  Bibasilar pleural effusions and atelectasis.  Extensive atherosclerotic disease. Leave foley in at D/C- follow up with urology for cysto   #6 acute on chronic kidney disease stage III  Patient on discharge from [facility] on 01/06/2012 and a creatinine of 1.29. Admission creatinine was 3.95. Secondary to post renal azotemia likely secondary to obstruction from BPH in the setting of volume depletion and dehydration and sepsis.  Renal function is slowly trending down. Creatinine improving. Renal ultrasound is consistent with bilateral hydronephrosis.    #7 dehydration  IV fluids stopped  #8 EKG changes  Patient with shortness of breath however denies any chest pain. Cardiac enzymes are negative x3. 2-D echo with EF of 70-75%. No wall motion abnormalities. Repeat EKG with T wave inversion in leads 1 aVL V4 through V6. Aspirin started per cardiology will need to monitor closely with patient's hematuria. Patient also started on diltiazem and metoprolol. Per cardiology patient will need ischemic evaluation once medically stable with probable initial Myoview scan. Cardiology is following and appreciate input and recommendations.   #9 BPH  Foley catheter. Per urology. Continue  Avodart, Flomax. Patient will likely need a  cystoscopy once medically stable.   #10 hyponatremia  Likely secondary to volume depletion. TSH is within normal limits.  Improving with hydration.   #11 depression/anxiety  Continue home regimen of Zoloft and Xanax.   #12 hypertension  Patient started on metoprolol and diltiazem per cardiology. Follow.   #13 severe iron deficiency anemia/acute blood loss anemia  H&H stable now.  Hematuria is slowly clearing up. Transfusion threshold hemoglobin less than 7. Once patient gets over this hospitalization if he has not had a colonoscopy done may benefit from 1. May need iron supplements on discharge.   #14 prophylaxis  PPI for GI prophylaxis. SCDs for DVT prophylaxis.  #15 hypokalemia- replace  #16 back pain Unsure if chronic or acute- will need MRI to r/o infectious process in spine  #17 constipation - plan to make miralax daily, try to limit pain meds   Code Status: full Family Communication: called daughter Disposition Plan: back to [facility]  for 4 more weeks of IV vanc when medically ready (hopefully Monday)    Consultants: Cards Urology ID  HPI/Subjective: C/o back pain- worsening No BM yesterday    Objective: Filed Vitals:   01/16/12 0030 01/16/12 0130 01/16/12 0245 01/16/12 0630  BP: 126/66 136/66 141/76 135/82  Pulse: 65 72 70 72  Temp: 98.5 F (36.9 C) 98.1 F (36.7 C) 98.5 F (36.9 C) 98.4 F (36.9 C)  TempSrc: Oral Oral Oral Oral  Resp: 18 16 20 20   Height:      Weight:    75.6 kg (166 lb 10.7 oz)  SpO2:    93%    Intake/Output Summary (Last 24 hours) at 01/16/12 0851 Last data filed at 01/16/12 0631  Gross per 24 hour  Intake  957.5 ml  Output   3500 ml  Net -2542.5 ml    Exam:  General: Alert, awake, oriented to self and place, appears uncomfortable  HEENT: No bruits, no goiter.  Heart: RRR, without murmurs, rubs, gallops.  Lungs: Clear to auscultation bilaterally in anterior lung fields.  Abdomen: Firm, minimally tender, mildly  distended, positive bowel sounds.  Extremities: No clubbing cyanosis or edema with positive pedal pulses.  Neuro: Grossly intact, nonfocal. Scrotum swelling   Data Reviewed: Basic Metabolic Panel:  Lab 01/16/12 1610 01/15/12 0320 01/14/12 0322 01/13/12 0327 01/12/12 0339 01/11/12 1159  NA 133* 136136 137 134* 129* --  K 3.4* 3.2*3.3* 3.4* 3.6 3.7 --  CL 98 106106 109 105 99 --  CO2 25 2322 20 20 18* --  GLUCOSE 98 118*137* 87 107* 129* --  BUN 39* 41*41* 52* 58* 66* --  CREATININE 1.50* 1.60*1.63* 1.83* 2.36* 3.14* --  CALCIUM 8.9 8.98.6 8.7 8.7 8.7 --  MG -- 1.8 2.0 -- -- 1.9  PHOS 2.9 2.5 3.2 -- -- --   Liver Function Tests:  Lab 01/16/12 0355 01/15/12 0320 01/14/12 0322 01/13/12 0327 01/12/12 0339 01/11/12 1159  AST -- -- -- 16 13 18   ALT -- -- -- 12 9 12   ALKPHOS -- -- -- 81 63 67  BILITOT -- -- -- 0.3 0.2* 0.2*  PROT -- -- -- 6.2 6.6 7.2  ALBUMIN 1.9* 1.8* 1.7* 1.8* 2.1* --   No results found for this basename: LIPASE:5,AMYLASE:5 in the last 168 hours No results found for this basename: AMMONIA:5 in the last 168 hours CBC:  Lab 01/16/12 0355 01/15/12 0320 01/14/12 0322 01/13/12 0327 01/12/12 0339 01/11/12 1159  WBC 9.5 8.4 10.2 15.6*  17.1* --  NEUTROABS -- -- 9.2* 14.5* 16.3* 20.4*  HGB 9.2* 7.8* 7.7* 7.5* 8.3* --  HCT 28.0* 23.8* 23.1* 22.2* 24.2* --  MCV 90.9 94.1 92.4 92.9 91.7 --  PLT 209 206 203 193 241 --   Cardiac Enzymes:  Lab 01/12/12 0339 01/11/12 1947 01/11/12 1159  CKTOTAL 34 53 60  CKMB 2.0 2.2 3.2  CKMBINDEX -- -- --  TROPONINI <0.30 <0.30 <0.30   BNP (last 3 results)  Basename 01/14/12 0330  PROBNP 8047.0*   CBG: No results found for this basename: GLUCAP:5 in the last 168 hours  Recent Results (from the past 240 hour(s))  URINE CULTURE     Status: Normal   Collection Time   01/11/12 11:47 AM      Component Value Range Status Comment   Specimen Description URINE, CATHETERIZED   Final    Special Requests NONE   Final     Culture  Setup Time 01/11/2012 16:43   Final    Colony Count >=100,000 COLONIES/ML   Final    Culture     Final    Value: METHICILLIN RESISTANT STAPHYLOCOCCUS AUREUS     Note: RIFAMPIN AND GENTAMICIN SHOULD NOT BE USED AS SINGLE DRUGS FOR TREATMENT OF STAPH INFECTIONS. CRITICAL RESULT CALLED TO, READ BACK BY AND VERIFIED WITH: PEACE DORMON @ 0622 ON 01/14/2012 HAJAM   Report Status 01/14/2012 FINAL   Final    Organism ID, Bacteria METHICILLIN RESISTANT STAPHYLOCOCCUS AUREUS   Final   CULTURE, BLOOD (ROUTINE X 2)     Status: Normal   Collection Time   01/11/12  3:03 PM      Component Value Range Status Comment   Specimen Description BLOOD RIGHT ARM  5 ML IN [facility] BOTTLE   Final    Special Requests NONE   Final    Culture  Setup Time 01/11/2012 22:25   Final    Culture     Final    Value: STAPHYLOCOCCUS AUREUS     Note: SUSCEPTIBILITIES PERFORMED ON PREVIOUS CULTURE WITHIN THE LAST 5 DAYS.     Note: Gram Stain Report Called to,Read Back By and Verified With: SHEILA MAIN @ 1746 ON 01/12/12 BY GOLLD   Report Status 01/14/2012 FINAL   Final   CULTURE, BLOOD (ROUTINE X 2)     Status: Normal   Collection Time   01/11/12  3:36 PM      Component Value Range Status Comment   Specimen Description BLOOD RIGHT HAND  3 ML IN [facility] BOTTLE   Final    Special Requests NONE   Final    Culture  Setup Time 01/11/2012 22:25   Final    Culture     Final    Value: METHICILLIN RESISTANT STAPHYLOCOCCUS AUREUS     Note: RIFAMPIN AND GENTAMICIN SHOULD NOT BE USED AS SINGLE DRUGS FOR TREATMENT OF STAPH INFECTIONS. CRITICAL RESULT CALLED TO, READ BACK BY AND VERIFIED WITH: PAM WEST BY INGRAM A 7/9 2P     Note: Gram Stain Report Called to,Read Back By and Verified With: CONRAD WHITROW 01/12/12 1050 BY SMITHERSJ   Report Status 01/14/2012 FINAL   Final    Organism ID, Bacteria METHICILLIN RESISTANT STAPHYLOCOCCUS AUREUS   Final   MRSA PCR SCREENING     Status: Abnormal   Collection Time   01/11/12  4:37 PM      Component  Value Range Status Comment   MRSA by PCR POSITIVE (*) NEGATIVE Final   CULTURE, BLOOD (ROUTINE  X 2)     Status: Normal (Preliminary result)   Collection Time   01/13/12  1:20 PM      Component Value Range Status Comment   Specimen Description BLOOD RIGHT ARM   Final    Special Requests BOTTLES DRAWN AEROBIC AND ANAEROBIC 5 CC EACH   Final    Culture  Setup Time 01/13/2012 19:33   Final    Culture     Final    Value: GRAM POSITIVE COCCI IN CLUSTERS     Note: Gram Stain Report Called to,Read Back By and Verified With: CHRISTY @ 2334 ON 01/15/12 BY GOLLD   Report Status PENDING   Incomplete   CULTURE, BLOOD (ROUTINE X 2)     Status: Normal (Preliminary result)   Collection Time   01/13/12  1:25 PM      Component Value Range Status Comment   Specimen Description BLOOD RIGHT ARM   Final    Special Requests BOTTLES DRAWN AEROBIC AND ANAEROBIC 5CC EACH   Final    Culture  Setup Time 01/13/2012 19:33   Final    Culture     Final    Value:        BLOOD CULTURE RECEIVED NO GROWTH TO DATE CULTURE WILL BE HELD FOR 5 DAYS BEFORE ISSUING A FINAL NEGATIVE REPORT   Report Status PENDING   Incomplete      Studies: Ct Abdomen Pelvis Wo Contrast  01/14/2012  *RADIOLOGY REPORT*  Clinical Data: Doses question bladder mass or diverticula on ultrasound  CT ABDOMEN AND PELVIS WITHOUT CONTRAST  Technique:  Multidetector CT imaging of the abdomen and pelvis was performed following the standard protocol without intravenous contrast. Oral contrast not administered.  Sagittal and coronal MPR images reconstructed from axial data set.  Comparison: Renal ultrasound 01/12/2012  Findings: Bibasilar pleural effusions and atelectasis. Mildly complicated cyst with wall calcification lateral aspect upper pole left kidney 4.1 x 4.4 x 4.0 cm. 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 No right hydronephrosis but mild right ureteral dilatation present. Marked enlargement of prostate gland,  approximately 8.0 x 6.5 x 6.3 cm. Bilateral bladder diverticula are identified anterior and superior to the ureteral orifices, containing dependent calculi. Additional diverticulum at dome of bladder containing air. Foley catheter present. Diffuse bladder wall thickening identified, unable to exclude tumor.  Calcified granulomata in liver and spleen. Distended gallbladder. Scattered respiratory motion artifacts. No definite additional focal abnormalities of liver, spleen, pancreas, or adrenal glands. Moderate sized hiatal hernia. Extensive atherosclerotic calcifications. Normal appendix. Colonic diverticulosis. No mass, adenopathy, free fluid, or inflammatory process. No acute osseous findings.  IMPRESSION: Marked prostatic enlargement. Diffuse bladder wall thickening, can be seen with inflammatory and neoplastic processes; correlation with cystoscopy recommended. Multiple bladder diverticula some which contain dependent calculi. 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 Mild right ureteral dilatation without hydronephrosis. Sigmoid diverticulosis. Hiatal hernia. Bibasilar pleural effusions and atelectasis. Extensive atherosclerotic disease.  Original Report Authenticated By: Lollie Marrow, M.D.    Scheduled Meds:    . aspirin  81 mg Oral Daily  . Chlorhexidine Gluconate Cloth  6 each Topical Q0600  . diltiazem  120 mg Oral Daily  . dutasteride  0.5 mg Oral Daily  . furosemide  40 mg Intravenous BID  . furosemide  40 mg Intravenous Once  . metoprolol tartrate  25 mg Oral TID  . mupirocin ointment  1 application Nasal BID  . potassium chloride  40 mEq Oral BID  . potassium chloride  40 mEq Oral Once  . sertraline  150 mg Oral Daily  . sodium chloride  3 mL Intravenous Q12H  . Tamsulosin HCl  0.4 mg Oral QPC supper  . vancomycin  1,000 mg Intravenous Q24H  . DISCONTD: metoprolol tartrate  25 mg Oral BID   Continuous Infusions:    . sodium chloride  10 mL/hr at 01/15/12 0849     Marlin Canary [facility] Pager [phone] If 8PM-8AM, please contact night-coverage [email or web address] Password [facility] 01/16/2012, 8:51 AM   LOS: 5 days

## 2012-01-16 NOTE — Progress Notes (Signed)
INFECTIOUS DISEASE PROGRESS NOTE  ID: Jack Stevens is a 78 y.o. male with   Principal Problem:  *Sepsis Active Problems:  Acute on chronic kidney disease, stage 3  UTI (lower urinary tract infection)  Dehydration  EKG abnormalities  Hyponatremia  Weakness generalized  Leukocytosis  BPH (benign prostatic hyperplasia)  Anemia  Acute respiratory distress  Hematuria  HTN (hypertension), LVH by volts  Bacteremia due to Staphylococcus aureus  Back pain  Subjective: Without complaints  Abtx:  Anti-infectives     Start     Dose/Rate Route Frequency Ordered Stop   01/15/12 1000   vancomycin (VANCOCIN) IVPB 1000 mg/200 mL premix        1,000 mg 200 mL/hr over 60 Minutes Intravenous Every 24 hours 01/14/12 1257     01/12/12 1400   piperacillin-tazobactam (ZOSYN) IVPB 2.25 g  Status:  Discontinued        2.25 g 100 mL/hr over 30 Minutes Intravenous Every 6 hours 01/12/12 1302 01/13/12 1158   01/12/12 1045   vancomycin (VANCOCIN) IVPB 1000 mg/200 mL premix  Status:  Discontinued        1,000 mg 200 mL/hr over 60 Minutes Intravenous Every 48 hours 01/12/12 1009 01/14/12 1257   01/12/12 0000   piperacillin-tazobactam (ZOSYN) IVPB 2.25 g  Status:  Discontinued        2.25 g 100 mL/hr over 30 Minutes Intravenous Every 8 hours 01/11/12 1553 01/12/12 1302   01/11/12 1600  piperacillin-tazobactam (ZOSYN) IVPB 2.25 g       2.25 g 100 mL/hr over 30 Minutes Intravenous STAT 01/11/12 1546 01/11/12 1705   01/11/12 1300   cefTRIAXone (ROCEPHIN) 1 g in dextrose 5 % 50 mL IVPB        1 g 100 mL/hr over 30 Minutes Intravenous  Once 01/11/12 1258 01/11/12 1333          Medications:  Scheduled:   . aspirin  81 mg Oral Daily  . Chlorhexidine Gluconate Cloth  6 each Topical Q0600  . diltiazem  120 mg Oral Daily  . dutasteride  0.5 mg Oral Daily  . furosemide  40 mg Intravenous BID  . furosemide  40 mg Intravenous Once  . metoprolol tartrate  25 mg Oral TID  . mupirocin ointment  1  application Nasal BID  . polyethylene glycol  17 g Oral Daily  . potassium chloride  40 mEq Oral BID  . potassium chloride  40 mEq Oral Once  . sertraline  150 mg Oral Daily  . sodium chloride  3 mL Intravenous Q12H  . Tamsulosin HCl  0.4 mg Oral QPC supper  . vancomycin  1,000 mg Intravenous Q24H    Objective: Vital signs in last 24 hours: Temp:  [97.9 F (36.6 C)-98.8 F (37.1 C)] 98.4 F (36.9 C) (07/12 0630) Pulse Rate:  [65-105] 72  (07/12 0630) Resp:  [16-20] 20  (07/12 0630) BP: (119-161)/(52-83) 135/82 mmHg (07/12 0630) SpO2:  [92 %-93 %] 93 % (07/12 0630) Weight:  [75.6 kg (166 lb 10.7 oz)-75.9 kg (167 lb 5.3 oz)] 75.6 kg (166 lb 10.7 oz) (07/12 0630)   General appearance: alert, cooperative and no distress Resp: clear to auscultation bilaterally Cardio: regular rate and rhythm GI: normal findings: bowel sounds normal and soft, non-tender  Lab Results  Basename 01/16/12 0355 01/15/12 0320  WBC 9.5 8.4  HGB 9.2* 7.8*  HCT 28.0* 23.8*  NA 133* 136136  K 3.4* 3.2*3.3*  CL 98 106106  CO2 25 2322  BUN  39* 41*41*  CREATININE 1.50* 1.60*1.63*  GLU -- --   Liver Panel  Basename 01/16/12 0355 01/15/12 0320  PROT -- --  ALBUMIN 1.9* 1.8*  AST -- --  ALT -- --  ALKPHOS -- --  BILITOT -- --  BILIDIR -- --  IBILI -- --   Sedimentation Rate No results found for this basename: ESRSEDRATE in the last 72 hours C-Reactive Protein No results found for this basename: CRP:2 in the last 72 hours  Microbiology: Recent Results (from the past 240 hour(s))  URINE CULTURE     Status: Normal   Collection Time   01/11/12 11:47 AM      Component Value Range Status Comment   Specimen Description URINE, CATHETERIZED   Final    Special Requests NONE   Final    Culture  Setup Time 01/11/2012 16:43   Final    Colony Count >=100,000 COLONIES/ML   Final    Culture     Final    Value: METHICILLIN RESISTANT STAPHYLOCOCCUS AUREUS     Note: RIFAMPIN AND GENTAMICIN SHOULD  NOT BE USED AS SINGLE DRUGS FOR TREATMENT OF STAPH INFECTIONS. CRITICAL RESULT CALLED TO, READ BACK BY AND VERIFIED WITH: PEACE DORMON @ 0622 ON 01/14/2012 HAJAM   Report Status 01/14/2012 FINAL   Final    Organism ID, Bacteria METHICILLIN RESISTANT STAPHYLOCOCCUS AUREUS   Final   CULTURE, BLOOD (ROUTINE X 2)     Status: Normal   Collection Time   01/11/12  3:03 PM      Component Value Range Status Comment   Specimen Description BLOOD RIGHT ARM  5 ML IN [facility] BOTTLE   Final    Special Requests NONE   Final    Culture  Setup Time 01/11/2012 22:25   Final    Culture     Final    Value: STAPHYLOCOCCUS AUREUS     Note: SUSCEPTIBILITIES PERFORMED ON PREVIOUS CULTURE WITHIN THE LAST 5 DAYS.     Note: Gram Stain Report Called to,Read Back By and Verified With: SHEILA MAIN @ 1746 ON 01/12/12 BY GOLLD   Report Status 01/14/2012 FINAL   Final   CULTURE, BLOOD (ROUTINE X 2)     Status: Normal   Collection Time   01/11/12  3:36 PM      Component Value Range Status Comment   Specimen Description BLOOD RIGHT HAND  3 ML IN [facility] BOTTLE   Final    Special Requests NONE   Final    Culture  Setup Time 01/11/2012 22:25   Final    Culture     Final    Value: METHICILLIN RESISTANT STAPHYLOCOCCUS AUREUS     Note: RIFAMPIN AND GENTAMICIN SHOULD NOT BE USED AS SINGLE DRUGS FOR TREATMENT OF STAPH INFECTIONS. CRITICAL RESULT CALLED TO, READ BACK BY AND VERIFIED WITH: PAM WEST BY INGRAM A 7/9 2P     Note: Gram Stain Report Called to,Read Back By and Verified With: CONRAD WHITROW 01/12/12 1050 BY SMITHERSJ   Report Status 01/14/2012 FINAL   Final    Organism ID, Bacteria METHICILLIN RESISTANT STAPHYLOCOCCUS AUREUS   Final   MRSA PCR SCREENING     Status: Abnormal   Collection Time   01/11/12  4:37 PM      Component Value Range Status Comment   MRSA by PCR POSITIVE (*) NEGATIVE Final   CULTURE, BLOOD (ROUTINE X 2)     Status: Normal (Preliminary result)   Collection Time   01/13/12  1:20 PM  Component Value Range  Status Comment   Specimen Description BLOOD RIGHT ARM   Final    Special Requests BOTTLES DRAWN AEROBIC AND ANAEROBIC 5 CC EACH   Final    Culture  Setup Time 01/13/2012 19:33   Final    Culture     Final    Value: GRAM POSITIVE COCCI IN CLUSTERS     Note: Gram Stain Report Called to,Read Back By and Verified With: CHRISTY @ 2334 ON 01/15/12 BY GOLLD   Report Status PENDING   Incomplete   CULTURE, BLOOD (ROUTINE X 2)     Status: Normal (Preliminary result)   Collection Time   01/13/12  1:25 PM      Component Value Range Status Comment   Specimen Description BLOOD RIGHT ARM   Final    Special Requests BOTTLES DRAWN AEROBIC AND ANAEROBIC 5CC EACH   Final    Culture  Setup Time 01/13/2012 19:33   Final    Culture     Final    Value:        BLOOD CULTURE RECEIVED NO GROWTH TO DATE CULTURE WILL BE HELD FOR 5 DAYS BEFORE ISSUING A FINAL NEGATIVE REPORT   Report Status PENDING   Incomplete     Studies/Results: Mr Thoracic Spine Wo Contrast  01/16/2012  *RADIOLOGY REPORT*  Clinical Data: Back pain.  Bacteremia.  MRI THORACIC SPINE WITHOUT CONTRAST  Technique:  Multiplanar and multiecho pulse sequences of the thoracic spine were obtained without intravenous contrast.  Comparison: None.  Findings: There is no evidence of fracture in the thoracic region.  T1-2 and T2-3 are unremarkable.  The T3-4 disc shows anterior osteophyte formation.  No canal compromise.  In the region from T4- T7, the vertebral bodies appear fused.  The T7-8 disc shows degenerative desiccation but no bulge or herniation.  In the region from T8-T10, the vertebral bodies appear fused.  The T10-11 disc shows some Schmorl's nodes and some slightly increased water signal, but there are are no destructive changes or edematous changes in the adjacent vertebral bodies.  Therefore, this is felt unlikely to relate to infection.  The T11-12 disc shows mild degenerative change with lower level T2 signal.  The T12-L1 disc is unremarkable.  The  spinal canal is widely patent.  Ample subarachnoid space surrounds the cord.  No cord pathology.  No facet arthropathy. Paravertebral soft tissues show bilateral pleural effusions with dependent pulmonary atelectasis.  There is either some loculated fluid or a cyst in the lower right pleural space.  IMPRESSION: No evidence of acute spinal pathology.  Segments of fusion, including from T4-T7 and T8-T10.  Mild degenerative changes at the disc levels that are not fused.  There is some T2 signal within the T10-11 and T11-12 discs, but the endplates do not appear irregular and there is no vertebral body edema.  Therefore, I think it is unlikely that there is diskitis at those levels.  Conceivably, early diskitis can look this way but I think that is unlikely.  Bilateral pleural effusions with pulmonary atelectasis.  Possible cyst or loculation in the right lower chest.  Original Report Authenticated By: Thomasenia Sales, M.D.     Assessment/Plan: MRSA bacteremia  Urosepsis  Back Pain (MRI- for discitis) Day 5 Vancomycin  Prostate enlargement  ARF  Would-  Continue vancomycin  Repeat BCx is 1/2+ for GPC.  Raises likelihood for intravascular or disseminated infection. Would ? Etiology of his abnormal rhythm as well.  Given this, would give 6  weeks of IV vanco.  Cr improving, slightly over last 24h Improve nutrition.... Available if questions over w/e.    Johny Sax Infectious Diseases [phone] 01/16/2012, 4:30 PM   LOS: 5 days

## 2012-01-16 NOTE — Progress Notes (Signed)
Subjective: No chest pain does complain of back pain  Objective: Vital signs in last 24 hours: Temp:  [97.9 F (36.6 C)-99.7 F (37.6 C)] 98.4 F (36.9 C) (07/12 0630) Pulse Rate:  [65-105] 72  (07/12 0630) Resp:  [16-20] 20  (07/12 0630) BP: (119-161)/(52-83) 135/82 mmHg (07/12 0630) SpO2:  [92 %-93 %] 93 % (07/12 0630) Weight:  [75.6 kg (166 lb 10.7 oz)-75.9 kg (167 lb 5.3 oz)] 75.6 kg (166 lb 10.7 oz) (07/12 0630) Weight change: -1.9 kg (-4 lb 3 oz) Last BM Date: 01/15/12 Intake/Output from previous day: -2522 (total -5,328)  Wt 75.6 KG down from 81.4 at pk. Wt. 07/11 0701 - 07/12 0700 In: 967.5 [P.O.:200; I.V.:230; Blood:337.5; IV Piggyback:200] Out: 3500 [Urine:3500] Intake/Output this shift: Total I/O In: 240 [P.O.:240] Out: -   PE: General:color improved after transfusion Heart:S1S2 RRR with slowed beats. Lungs:fairly clear Abd:+ BS soft, mild tenderness Ext:no edema   Lab Results:  Basename 01/16/12 0355 01/15/12 0320  WBC 9.5 8.4  HGB 9.2* 7.8*  HCT 28.0* 23.8*  PLT 209 206   BMET  Basename 01/16/12 0355 01/15/12 0320  NA 133* 136136  K 3.4* 3.2*3.3*  CL 98 106106  CO2 25 2322  GLUCOSE 98 118*137*  BUN 39* 41*41*  CREATININE 1.50* 1.60*1.63*  CALCIUM 8.9 8.98.6   No results found for this basename: TROPONINI:2,CK,MB:2 in the last 72 hours  No results found for this basename: CHOL, HDL, LDLCALC, LDLDIRECT, TRIG, CHOLHDL   No results found for this basename: HGBA1C     Lab Results  Component Value Date   TSH 1.354 01/11/2012    Hepatic Function Panel  Basename 01/16/12 0355  PROT --  ALBUMIN 1.9*  AST --  ALT --  ALKPHOS --  BILITOT --  BILIDIR --  IBILI --   No results found for this basename: CHOL in the last 72 hours No results found for this basename: PROTIME in the last 72 hours    EKG: Orders placed during the hospital encounter of 01/11/12  . ED EKG  . ED EKG  . EKG 12-LEAD  . EKG 12-LEAD  . EKG 12-LEAD  . EKG  12-LEAD  . EKG    Studies/Results: 2D Echo:  Left ventricle: The cavity size was normal. Wall thickness was normal. Systolic function was hyperdynamic. The estimated ejection fraction was in the range of 70% to 75%. Wall motion was normal; there were no regional wall motion abnormalities. Doppler parameters are consistent with abnormal left ventricular relaxation (grade 1 diastolic dysfunction). - Right ventricle: Systolic function was hyperdynamic. - Pulmonary arteries: Systolic pressure was mildly increased. PA peak pressure: 44mm Hg (S).   Medications: I have reviewed the patient's current medications.    Marland Kitchen aspirin  81 mg Oral Daily  . Chlorhexidine Gluconate Cloth  6 each Topical Q0600  . diltiazem  120 mg Oral Daily  . dutasteride  0.5 mg Oral Daily  . furosemide  40 mg Intravenous BID  . furosemide  40 mg Intravenous Once  . metoprolol tartrate  25 mg Oral TID  . mupirocin ointment  1 application Nasal BID  . polyethylene glycol  17 g Oral Daily  . potassium chloride  40 mEq Oral BID  . potassium chloride  40 mEq Oral Once  . sertraline  150 mg Oral Daily  . sodium chloride  3 mL Intravenous Q12H  . Tamsulosin HCl  0.4 mg Oral QPC supper  . vancomycin  1,000 mg Intravenous Q24H  . DISCONTD:  metoprolol tartrate  25 mg Oral BID   Assessment/Plan: Patient Active Problem List  Diagnosis  . Sepsis  . Acute on chronic kidney disease, stage 3  . UTI (lower urinary tract infection)  . Dehydration  . EKG abnormalities  . Hyponatremia  . Weakness generalized  . Leukocytosis  . BPH (benign prostatic hyperplasia)  . Anemia  . Acute blood loss anemia  . Acute respiratory distress  . Hematuria  . HTN (hypertension), LVH by volts  . Bacteremia due to Staphylococcus aureus  . Back pain   PLAN:H/H improved after transfusion. Abnormal rhythm, SR with non conducted PACs?  MD to review. If plan for cysto in future ? Need for Steffanie Dunn on Monday?  Vs. The  weekend?    LOS: 5 days   Jack Stevens R 01/16/2012, 1:08 PM

## 2012-01-17 LAB — RENAL FUNCTION PANEL
Albumin: 1.9 g/dL — ABNORMAL LOW (ref 3.5–5.2)
BUN: 36 mg/dL — ABNORMAL HIGH (ref 6–23)
Calcium: 9.1 mg/dL (ref 8.4–10.5)
Glucose, Bld: 96 mg/dL (ref 70–99)
Phosphorus: 2.9 mg/dL (ref 2.3–4.6)
Potassium: 3.7 mEq/L (ref 3.5–5.1)

## 2012-01-17 LAB — PRO B NATRIURETIC PEPTIDE: Pro B Natriuretic peptide (BNP): 2586 pg/mL — ABNORMAL HIGH (ref 0–450)

## 2012-01-17 MED ORDER — SODIUM CHLORIDE 0.9 % IJ SOLN
10.0000 mL | INTRAMUSCULAR | Status: DC | PRN
  Administered 2012-01-18 – 2012-01-19 (×3): 10 mL

## 2012-01-17 MED ORDER — ALPRAZOLAM 0.5 MG PO TABS
0.5000 mg | ORAL_TABLET | Freq: Three times a day (TID) | ORAL | Status: DC
  Administered 2012-01-17 – 2012-01-19 (×5): 0.5 mg via ORAL
  Filled 2012-01-17 (×5): qty 1

## 2012-01-17 NOTE — Progress Notes (Signed)
ANTIBIOTIC CONSULT NOTE - FOLLOW UP  Pharmacy Consult for Vanco Indication: MRSA Bacteremia and UTI  No Known Allergies  Patient Measurements: Height: 5\' 9"  (175.3 cm) Weight: 161 lb 2.5 oz (73.1 kg) IBW/kg (Calculated) : 70.7   Vital Signs: Temp: 97.8 F (36.6 C) (07/13 0531) Temp src: Oral (07/13 0531) BP: 157/70 mmHg (07/13 0531) Pulse Rate: 72  (07/13 0531) Intake/Output from previous day: 07/12 0701 - 07/13 0700 In: 430 [P.O.:300; I.V.:130] Out: 2600 [Urine:2600] Intake/Output from this shift: Total I/O In: 120 [P.O.:120] Out: 325 [Urine:325]  Labs:  [facility] 01/17/12 0512 01/16/12 0355 01/15/12 0320  WBC -- 9.5 8.4  HGB -- 9.2* 7.8*  PLT -- 209 206  LABCREA -- -- --  CREATININE 1.37* 1.50* 1.60*1.63*   Estimated Creatinine Clearance: 38 ml/min (by C-G formula based on Cr of 1.37).  Basename 01/17/12 0932  VANCOTROUGH 15.0  VANCOPEAK --  Drue Dun --  GENTTROUGH --  GENTPEAK --  GENTRANDOM --  TOBRATROUGH --  TOBRAPEAK --  TOBRARND --  AMIKACINPEAK --  AMIKACINTROU --  AMIKACIN --     Microbiology: Recent Results (from the past 720 hour(s))  URINE CULTURE     Status: Normal   Collection Time   01/11/12 11:47 AM      Component Value Range Status Comment   Specimen Description URINE, CATHETERIZED   Final    Special Requests NONE   Final    Culture  Setup Time 01/11/2012 16:43   Final    Colony Count >=100,000 COLONIES/ML   Final    Culture     Final    Value: METHICILLIN RESISTANT STAPHYLOCOCCUS AUREUS     Note: RIFAMPIN AND GENTAMICIN SHOULD NOT BE USED AS SINGLE DRUGS FOR TREATMENT OF STAPH INFECTIONS. CRITICAL RESULT CALLED TO, READ BACK BY AND VERIFIED WITH: PEACE DORMON @ 0622 ON 01/14/2012 HAJAM   Report Status 01/14/2012 FINAL   Final    Organism ID, Bacteria METHICILLIN RESISTANT STAPHYLOCOCCUS AUREUS   Final   CULTURE, BLOOD (ROUTINE X 2)     Status: Normal   Collection Time   01/11/12  3:03 PM      Component Value Range Status Comment    Specimen Description BLOOD RIGHT ARM  5 ML IN [facility] BOTTLE   Final    Special Requests NONE   Final    Culture  Setup Time 01/11/2012 Jack:25   Final    Culture     Final    Value: STAPHYLOCOCCUS AUREUS     Note: SUSCEPTIBILITIES PERFORMED ON PREVIOUS CULTURE WITHIN THE LAST 5 DAYS.     Note: Gram Stain Report Called to,Read Back By and Verified With: SHEILA MAIN @ 1746 ON 01/12/12 BY GOLLD   Report Status 01/14/2012 FINAL   Final   CULTURE, BLOOD (ROUTINE X 2)     Status: Normal   Collection Time   01/11/12  3:36 PM      Component Value Range Status Comment   Specimen Description BLOOD RIGHT HAND  3 ML IN [facility] BOTTLE   Final    Special Requests NONE   Final    Culture  Setup Time 01/11/2012 Jack:25   Final    Culture     Final    Value: METHICILLIN RESISTANT STAPHYLOCOCCUS AUREUS     Note: RIFAMPIN AND GENTAMICIN SHOULD NOT BE USED AS SINGLE DRUGS FOR TREATMENT OF STAPH INFECTIONS. CRITICAL RESULT CALLED TO, READ BACK BY AND VERIFIED WITH: PAM WEST BY INGRAM A 7/9 2P  Note: Gram Stain Report Called to,Read Back By and Verified With: CONRAD WHITROW 01/12/12 1050 BY SMITHERSJ   Report Status 01/14/2012 FINAL   Final    Organism ID, Bacteria METHICILLIN RESISTANT STAPHYLOCOCCUS AUREUS   Final   MRSA PCR SCREENING     Status: Abnormal   Collection Time   01/11/12  4:37 PM      Component Value Range Status Comment   MRSA by PCR POSITIVE (*) NEGATIVE Final   CULTURE, BLOOD (ROUTINE X 2)     Status: Normal (Preliminary result)   Collection Time   01/13/12  1:20 PM      Component Value Range Status Comment   Specimen Description BLOOD RIGHT ARM   Final    Special Requests BOTTLES DRAWN AEROBIC AND ANAEROBIC 5 CC EACH   Final    Culture  Setup Time 01/13/2012 19:33   Final    Culture     Final    Value: GRAM POSITIVE COCCI IN CLUSTERS     Note: Gram Stain Report Called to,Read Back By and Verified With: CHRISTY @ 2334 ON 01/15/12 BY GOLLD   Report Status PENDING   Incomplete   CULTURE, BLOOD  (ROUTINE X 2)     Status: Normal (Preliminary result)   Collection Time   01/13/12  1:25 PM      Component Value Range Status Comment   Specimen Description BLOOD RIGHT ARM   Final    Special Requests BOTTLES DRAWN AEROBIC AND ANAEROBIC 5CC EACH   Final    Culture  Setup Time 01/13/2012 19:33   Final    Culture     Final    Value:        BLOOD CULTURE RECEIVED NO GROWTH TO DATE CULTURE WILL BE HELD FOR 5 DAYS BEFORE ISSUING A FINAL NEGATIVE REPORT   Report Status PENDING   Incomplete     Anti-infectives     Start     Dose/Rate Route Frequency Ordered Stop   01/15/12 1000   vancomycin (VANCOCIN) IVPB 1000 mg/200 mL premix        1,000 mg 200 mL/hr over 60 Minutes Intravenous Every 24 hours 01/14/12 1257     01/12/12 1400   piperacillin-tazobactam (ZOSYN) IVPB 2.25 g  Status:  Discontinued        2.25 g 100 mL/hr over 30 Minutes Intravenous Every 6 hours 01/12/12 1302 01/13/12 1158   01/12/12 1045   vancomycin (VANCOCIN) IVPB 1000 mg/200 mL premix  Status:  Discontinued        1,000 mg 200 mL/hr over 60 Minutes Intravenous Every 48 hours 01/12/12 1009 01/14/12 1257   01/12/12 0000   piperacillin-tazobactam (ZOSYN) IVPB 2.25 g  Status:  Discontinued        2.25 g 100 mL/hr over 30 Minutes Intravenous Every 8 hours 01/11/12 1553 01/12/12 1302   01/11/12 1600   piperacillin-tazobactam (ZOSYN) IVPB 2.25 g        2.25 g 100 mL/hr over 30 Minutes Intravenous STAT 01/11/12 1546 01/11/12 1705   01/11/12 1300   cefTRIAXone (ROCEPHIN) 1 g in dextrose 5 % 50 mL IVPB        1 g 100 mL/hr over 30 Minutes Intravenous  Once 01/11/12 1258 01/11/12 1333          Assessment:  78 yo Jack Stevens on Day #6 vanco for MRSA bacteremia and UTI.   Acute renal failure is improving, vanc empirically adjusted 7/10.   ID recommends 6 weeks IV vanc since  TEE note done.  Vanc trough at goal 15 mcg/ml today  Goal of Therapy:  Vancomycin trough level 15-20 mcg/ml  Plan:  1) Continue Vanc to 1g IV q24h 2)  Recheck trough at steady state to assure levels remain adequate with improving renal function  Loralee Pacas, PharmD, BCPS Pager: [phone] 01/17/2012,10:48 AM

## 2012-01-17 NOTE — Progress Notes (Signed)
Peripherally Inserted Central Catheter/Midline Placement  The IV Nurse has discussed with the patient and/or persons authorized to consent for the patient, the purpose of this procedure and the potential benefits and risks involved with this procedure.  The benefits include less needle sticks, lab draws from the catheter and patient may be discharged home with the catheter.  Risks include, but not limited to, infection, bleeding, blood clot (thrombus formation), and puncture of an artery; nerve damage and irregular heat beat.  Alternatives to this procedure were also discussed.  PICC/Midline Placement Documentation        Timmothy Sours 01/17/2012, 6:27 PM

## 2012-01-17 NOTE — Progress Notes (Signed)
[facility]  DAILY PROGRESS NOTE   Subjective:  "I feel weak" - denies chest pain.  BNP is significantly improved with diuresis.  Objective:  Temp:  [97.8 F (36.6 C)-99.7 F (37.6 C)] 98.1 F (36.7 C) (07/13 1121) Pulse Rate:  [72-84] 84  (07/13 1121) Resp:  [18-20] 20  (07/13 1121) BP: (148-158)/(68-70) 148/70 mmHg (07/13 1121) SpO2:  [90 %-97 %] 96 % (07/13 1121) Weight:  [73.1 kg (161 lb 2.5 oz)] 73.1 kg (161 lb 2.5 oz) (07/13 0531) Weight change: -2.8 kg (-6 lb 2.8 oz)  Intake/Output from previous day: 07/12 0701 - 07/13 0700 In: 430 [P.O.:300; I.V.:130] Out: 2600 [Urine:2600]  Intake/Output from this shift: Total I/O In: 120 [P.O.:120] Out: 325 [Urine:325]  Medications: Current Facility-Administered Medications  Medication Dose Route Frequency Provider Last Rate Last Dose  . 0.9 %  sodium chloride infusion   Intravenous Continuous Merwyn Katos, MD 10 mL/hr at 01/16/12 1900    . acetaminophen (TYLENOL) tablet 650 mg  650 mg Oral Q6H PRN Rodolph Bong, MD   650 mg at 01/17/12 1126   Or  . acetaminophen (TYLENOL) suppository 650 mg  650 mg Rectal Q6H PRN Rodolph Bong, MD      . ALPRAZolam Prudy Feeler) tablet 0.5 mg  0.5 mg Oral TID PRN Rodolph Bong, MD   0.5 mg at 01/16/12 2245  . alum & mag hydroxide-simeth (MAALOX/MYLANTA) 200-200-20 MG/5ML suspension 30 mL  30 mL Oral Q6H PRN Rodolph Bong, MD      . aspirin chewable tablet 81 mg  81 mg Oral Daily Eda Paschal [place], [place]   81 mg at 01/17/12 1125  . bisacodyl (DULCOLAX) EC tablet 10 mg  10 mg Oral Daily PRN Rodolph Bong, MD      . Chlorhexidine Gluconate Cloth 2 % PADS 6 each  6 each Topical Q0600 Rodolph Bong, MD   6 each at 01/15/12 0636  . diltiazem (CARDIZEM CD) 24 hr capsule 120 mg  120 mg Oral Daily Eda Paschal [place], [place]   120 mg at 01/17/12 1125  . dutasteride (AVODART) capsule 0.5 mg  0.5 mg Oral Daily Rodolph Bong, MD   0.5 mg at 01/17/12 1125  . metoprolol  tartrate (LOPRESSOR) tablet 25 mg  25 mg Oral TID Lennette Bihari, MD   25 mg at 01/17/12 1125  . ondansetron (ZOFRAN) tablet 4 mg  4 mg Oral Q6H PRN Rodolph Bong, MD       Or  . ondansetron [facility]) injection 4 mg  4 mg Intravenous Q6H PRN Rodolph Bong, MD      . oxyCODONE (Oxy IR/ROXICODONE) immediate release tablet 5 mg  5 mg Oral Q4H PRN Rodolph Bong, MD   5 mg at 01/16/12 1511  . polyethylene glycol (MIRALAX / GLYCOLAX) packet 17 g  17 g Oral Daily Jessica U Vann, DO   17 g at 01/17/12 1126  . sertraline (ZOLOFT) tablet 150 mg  150 mg Oral Daily Rodolph Bong, MD   150 mg at 01/17/12 1126  . sodium chloride 0.9 % injection 3 mL  3 mL Intravenous Q12H Rodolph Bong, MD   3 mL at 01/16/12 1025  . Tamsulosin HCl (FLOMAX) capsule 0.4 mg  0.4 mg Oral QPC supper Rodolph Bong, MD   0.4 mg at 01/16/12 1733  . vancomycin (VANCOCIN) IVPB 1000 mg/200 mL premix  1,000 mg Intravenous Q24H Annia Belt, [place]  1,000 mg at 01/17/12 1201    Physical Exam: General appearance: alert and no distress Neck: no adenopathy, no carotid bruit, no JVD, supple, symmetrical, trachea midline and thyroid not enlarged, symmetric, no tenderness/mass/nodules Lungs: diminished breath sounds bibasilar Heart: regular rate and rhythm, S1, S2 normal, no murmur, click, rub or gallop Abdomen: soft, non-tender; bowel sounds normal; no masses,  no organomegaly Extremities: extremities normal, atraumatic, no cyanosis or edema Pulses: 2+ and symmetric Skin: pale Neurologic: Grossly normal  Lab Results: Results for orders placed during the hospital encounter of 01/11/12 (from the past 48 hour(s))  PREPARE RBC (CROSSMATCH)     Status: Normal   Collection Time   01/15/12  2:00 PM      Component Value Range Comment   Order Confirmation ORDER PROCESSED BY BLOOD BANK     TYPE AND SCREEN     Status: Normal   Collection Time   01/15/12  2:55 PM      Component Value Range Comment   ABO/RH(D) A  POS      Antibody Screen NEG      Sample Expiration 01/18/2012      Unit Number [id number]      Blood Component Type RED CELLS,LR      Unit division 00      Status of Unit ISSUED,FINAL      Transfusion Status OK TO TRANSFUSE      Crossmatch Result Compatible     ABO/RH     Status: Normal   Collection Time   01/15/12  4:21 PM      Component Value Range Comment   ABO/RH(D) A POS     RENAL FUNCTION PANEL     Status: Abnormal   Collection Time   01/16/12  3:55 AM      Component Value Range Comment   Sodium 133 (*) 135 - 145 mEq/L    Potassium 3.4 (*) 3.5 - 5.1 mEq/L    Chloride 98  96 - 112 mEq/L DELTA CHECK NOTED   CO2 25  19 - 32 mEq/L    Glucose, Bld 98  70 - 99 mg/dL    BUN 39 (*) 6 - 23 mg/dL    Creatinine, Ser 4.78 (*) 0.50 - 1.35 mg/dL    Calcium 8.9  8.4 - 29.5 mg/dL    Phosphorus 2.9  2.3 - 4.6 mg/dL    Albumin 1.9 (*) 3.5 - 5.2 g/dL    GFR calc non Af Amer 40 (*) >90 mL/min    GFR calc Af Amer 46 (*) >90 mL/min   CBC     Status: Abnormal   Collection Time   01/16/12  3:55 AM      Component Value Range Comment   WBC 9.5  4.0 - 10.5 K/uL    RBC 3.08 (*) 4.22 - 5.81 MIL/uL    Hemoglobin 9.2 (*) 13.0 - 17.0 g/dL    HCT 62.1 (*) 30.8 - 52.0 %    MCV 90.9  78.0 - 100.0 fL    MCH 29.9  26.0 - 34.0 pg    MCHC 32.9  30.0 - 36.0 g/dL    RDW 65.7  84.6 - 96.2 %    Platelets 209  150 - 400 K/uL   RENAL FUNCTION PANEL     Status: Abnormal   Collection Time   01/17/12  5:12 AM      Component Value Range Comment   Sodium 133 (*) 135 - 145 mEq/L    Potassium 3.7  3.5 - 5.1 mEq/L    Chloride 97  96 - 112 mEq/L    CO2 28  19 - 32 mEq/L    Glucose, Bld 96  70 - 99 mg/dL    BUN 36 (*) 6 - 23 mg/dL    Creatinine, Ser 1.61 (*) 0.50 - 1.35 mg/dL    Calcium 9.1  8.4 - 09.6 mg/dL    Phosphorus 2.9  2.3 - 4.6 mg/dL    Albumin 1.9 (*) 3.5 - 5.2 g/dL    GFR calc non Af Amer 45 (*) >90 mL/min    GFR calc Af Amer 52 (*) >90 mL/min   PRO B NATRIURETIC PEPTIDE     Status: Abnormal    Collection Time   01/17/12  5:12 AM      Component Value Range Comment   Pro B Natriuretic peptide (BNP) 2586.0 (*) 0 - 450 pg/mL   VANCOMYCIN, TROUGH     Status: Normal   Collection Time   01/17/12  9:32 AM      Component Value Range Comment   Vancomycin Tr 15.0  10.0 - 20.0 ug/mL     Imaging: Mr Thoracic Spine Wo Contrast  01/16/2012  *RADIOLOGY REPORT*  Clinical Data: Back pain.  Bacteremia.  MRI THORACIC SPINE WITHOUT CONTRAST  Technique:  Multiplanar and multiecho pulse sequences of the thoracic spine were obtained without intravenous contrast.  Comparison: None.  Findings: There is no evidence of fracture in the thoracic region.  T1-2 and T2-3 are unremarkable.  The T3-4 disc shows anterior osteophyte formation.  No canal compromise.  In the region from T4- T7, the vertebral bodies appear fused.  The T7-8 disc shows degenerative desiccation but no bulge or herniation.  In the region from T8-T10, the vertebral bodies appear fused.  The T10-11 disc shows some Schmorl's nodes and some slightly increased water signal, but there are are no destructive changes or edematous changes in the adjacent vertebral bodies.  Therefore, this is felt unlikely to relate to infection.  The T11-12 disc shows mild degenerative change with lower level T2 signal.  The T12-L1 disc is unremarkable.  The spinal canal is widely patent.  Ample subarachnoid space surrounds the cord.  No cord pathology.  No facet arthropathy. Paravertebral soft tissues show bilateral pleural effusions with dependent pulmonary atelectasis.  There is either some loculated fluid or a cyst in the lower right pleural space.  IMPRESSION: No evidence of acute spinal pathology.  Segments of fusion, including from T4-T7 and T8-T10.  Mild degenerative changes at the disc levels that are not fused.  There is some T2 signal within the T10-11 and T11-12 discs, but the endplates do not appear irregular and there is no vertebral body edema.  Therefore, I think  it is unlikely that there is diskitis at those levels.  Conceivably, early diskitis can look this way but I think that is unlikely.  Bilateral pleural effusions with pulmonary atelectasis.  Possible cyst or loculation in the right lower chest.  Original Report Authenticated By: Thomasenia Sales, M.D.    Assessment:  1. Principal Problem: 2.  *Sepsis 3. Active Problems: 4.  Acute on chronic kidney disease, stage 3 5.  UTI (lower urinary tract infection) 6.  Dehydration 7.  EKG abnormalities 8.  Hyponatremia 9.  Weakness generalized 10.  Leukocytosis 11.  BPH (benign prostatic hyperplasia) 12.  Anemia 13.  Acute respiratory distress 14.  Hematuria 15.  HTN (hypertension), LVH by volts 16.  Bacteremia due to Staphylococcus  aureus 17.  Back pain 18.   Plan:  1. H/H improved with transfusion. Serum creatinine is close to baseline. BNP now improved to ~2500. He is still slightly negative due to poor po intake. He may need a low dose diuretic at discharge. We will arrange stress testing in our office on an outpatient basis. Sign-off for now. Feel free to call with questions.  Time Spent Directly with Patient:  15 minutes  Length of Stay:  LOS: 6 days   Chrystie Nose, MD, [facility] Attending Cardiologist [facility]  Aksel Bencomo C 01/17/2012, 12:05 PM

## 2012-01-17 NOTE — Progress Notes (Signed)
[facility] PROGRESS NOTE  Jack Stevens AVW:[id number] DOB: 1921/08/06 DOA: 01/11/2012 PCP: No primary provider on file.  Assessment/Plan: Principal Problem:  *Sepsis Active Problems:  Acute on chronic kidney disease, stage 3  UTI (lower urinary tract infection)  Dehydration  EKG abnormalities  Hyponatremia  Weakness generalized  Leukocytosis  BPH (benign prostatic hyperplasia)  Anemia  Acute respiratory distress  Hematuria  HTN (hypertension), LVH by volts  Bacteremia due to Staphylococcus aureus  Back pain  #1 sepsis  Likely secondary to UTI and bacteremia. Patient's tachycardia has improved on rate limiting medications and antibiotics. Respiratory rate is also improved. WBC is trending down  Lactic acid level is trending down  Pro calcitonin is trending down.  Urine cultures consistent with staph aureus. Blood cultures are pending and preliminary findings consistent with staph aureus in 2 out of 2 blood cultures..  IV vancomycin for at least 6 weeks since no TEE done. Follow- from [facility], will get PICC  Placed (repeat BC- 1/2 + for staph a)- appreciate ID's assistance   #2 acute respiratory distress  Likely secondary to problem #1. ABG with a pH of 7.4 PCO2 of 27 PO2 of 91 bicarbonate of 18 from 01/12/2012. Tachycardia PNA has improved. Lactic acid and pro calcitonin levels are trending down. Blood cultures and urine cultures are consistent with staph aureus. IV vancomycin   #3 Probable Bacteremia  Blood cultures results growing staph aureus 2 out of 2. Patient is afebrile. White count is trending down. Continue vancomycin day #5 of 6 weeks  #4 UTI  Urine cultures are pending with preliminary results showing staph aureus. IV vancomycin Follow.   #5 gross hematuria  Questionable etiology. Renal ultrasound consistent with bilateral hydronephrosis with a very heterogeneous bladder concerning for an obstructive bladder mass. Hematuria is slowly improving urine is  clearing up. Continue indwelling Foley catheter and irrigate as needed.  Urology is following and recommended cystoscopy when patient is medically stable. Per urology. CT scan abdomen and pelvis without IV contrast shows: Marked prostatic enlargement.  Diffuse bladder wall thickening, can be seen with inflammatory and  neoplastic processes; correlation with cystoscopy recommended.  Multiple bladder diverticula some which contain dependent calculi.  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  Mild right ureteral dilatation without hydronephrosis.  Sigmoid diverticulosis.  Hiatal hernia.  Bibasilar pleural effusions and atelectasis.  Extensive atherosclerotic disease. Leave foley in at D/C- follow up with urology for cysto as outpatient   #6 acute on chronic kidney disease stage III  Patient on discharge from [place] on 01/06/2012 and a creatinine of 1.29. Admission creatinine was 3.95. Secondary to post renal azotemia likely secondary to obstruction from BPH in the setting of volume depletion and dehydration and sepsis.  Renal function is slowly trending down. Creatinine improving. Renal ultrasound is consistent with bilateral hydronephrosis.    #7 dehydration  IV fluids stopped  #8 EKG changes  Patient with shortness of breath however denies any chest pain. Cardiac enzymes are negative x3. 2-D echo with EF of 70-75%. No wall motion abnormalities. Repeat EKG with T wave inversion in leads 1 aVL V4 through V6. Aspirin started per cardiology will need to monitor closely with patient's hematuria. Patient also started on diltiazem and metoprolol. Per cardiology patient will need ischemic evaluation once medically stable with probable initial Myoview scan. Cardiology is following and appreciate input and recommendations.   BNP decreasing  #9 BPH  Foley catheter. Per urology. Continue Avodart, Flomax. Patient  will need a cystoscopy once medically  stable.   #10 hyponatremia  Likely secondary to volume depletion. TSH is within normal limits.  Improving with hydration.   #11 depression/anxiety  Continue home regimen of Zoloft and Xanax.   #12 hypertension  Patient started on metoprolol and diltiazem per cardiology. Follow.   #13 severe iron deficiency anemia/acute blood loss anemia  H&H stable now.  Hematuria is slowly clearing up. S/p 1 units PRBCs- stable. Once patient gets over this hospitalization if he has not had a colonoscopy done may benefit from 1. May need iron supplements on discharge.   #14 prophylaxis  PPI for GI prophylaxis. SCDs for DVT prophylaxis.  #15 hypokalemia- replace  #16 back pain Unsure if chronic or acute- MRI negative for infectious process in spine  #17 constipation - plan to make miralax daily, try to limit pain meds   Code Status: full Family Communication: none- unable to reach Disposition Plan: back to [facility]  for 6 more weeks of IV vanc when medically ready (hopefully Monday)    Consultants: Cards Urology ID  HPI/Subjective: Says not feeling much better- not sure of baseline Not eating much either     Objective: Filed Vitals:   01/16/12 1659 01/16/12 1755 01/16/12 2121 01/17/12 0531  BP:   158/68 157/70  Pulse:   80 72  Temp:   99.7 F (37.6 C) 97.8 F (36.6 C)  TempSrc:   Oral Oral  Resp:   18 20  Height:      Weight:    73.1 kg (161 lb 2.5 oz)  SpO2: 90% 95% 96% 97%    Intake/Output Summary (Last 24 hours) at 01/17/12 1022 Last data filed at 01/17/12 0556  Gross per 24 hour  Intake    190 ml  Output   2600 ml  Net  -2410 ml    Exam:  General: coopertative- does not know where he is today, appears comfortable  HEENT: No bruits, no goiter.  Heart: RRR, without murmurs, rubs, gallops.  Lungs: Clear to auscultation bilaterally in anterior lung fields.  Abdomen: Firm, minimally tender, mildly distended, positive bowel sounds.  Extremities: No clubbing  cyanosis or edema with positive pedal pulses.  Neuro: Grossly intact, nonfocal.    Data Reviewed: Basic Metabolic Panel:  Lab 01/17/12 0981 01/16/12 0355 01/15/12 0320 01/14/12 0322 01/13/12 0327 01/11/12 1159  NA 133* 133* 136136 137 134* --  K 3.7 3.4* 3.2*3.3* 3.4* 3.6 --  CL 97 98 106106 109 105 --  CO2 28 25 2322 20 20 --  GLUCOSE 96 98 118*137* 87 107* --  BUN 36* 39* 41*41* 52* 58* --  CREATININE 1.37* 1.50* 1.60*1.63* 1.83* 2.36* --  CALCIUM 9.1 8.9 8.98.6 8.7 8.7 --  MG -- -- 1.8 2.0 -- 1.9  PHOS 2.9 2.9 2.5 3.2 -- --   Liver Function Tests:  Lab 01/17/12 0512 01/16/12 0355 01/15/12 0320 01/14/12 0322 01/13/12 0327 01/12/12 0339 01/11/12 1159  AST -- -- -- -- 16 13 18   ALT -- -- -- -- 12 9 12   ALKPHOS -- -- -- -- 81 63 67  BILITOT -- -- -- -- 0.3 0.2* 0.2*  PROT -- -- -- -- 6.2 6.6 7.2  ALBUMIN 1.9* 1.9* 1.8* 1.7* 1.8* -- --   No results found for this basename: LIPASE:5,AMYLASE:5 in the last 168 hours No results found for this basename: AMMONIA:5 in the last 168 hours CBC:  Lab 01/16/12 0355 01/15/12 0320 01/14/12 0322 01/13/12 0327 01/12/12 0339 01/11/12  1159  WBC 9.5 8.4 10.2 15.6* 17.1* --  NEUTROABS -- -- 9.2* 14.5* 16.3* 20.4*  HGB 9.2* 7.8* 7.7* 7.5* 8.3* --  HCT 28.0* 23.8* 23.1* 22.2* 24.2* --  MCV 90.9 94.1 92.4 92.9 91.7 --  PLT 209 206 203 193 241 --   Cardiac Enzymes:  Lab 01/12/12 0339 01/11/12 1947 01/11/12 1159  CKTOTAL 34 53 60  CKMB 2.0 2.2 3.2  CKMBINDEX -- -- --  TROPONINI <0.30 <0.30 <0.30   BNP (last 3 results)  Basename 01/17/12 0512 01/14/12 0330  PROBNP 2586.0* 8047.0*   CBG: No results found for this basename: GLUCAP:5 in the last 168 hours  Recent Results (from the past 240 hour(s))  URINE CULTURE     Status: Normal   Collection Time   01/11/12 11:47 AM      Component Value Range Status Comment   Specimen Description URINE, CATHETERIZED   Final    Special Requests NONE   Final    Culture  Setup Time 01/11/2012  16:43   Final    Colony Count >=100,000 COLONIES/ML   Final    Culture     Final    Value: METHICILLIN RESISTANT STAPHYLOCOCCUS AUREUS     Note: RIFAMPIN AND GENTAMICIN SHOULD NOT BE USED AS SINGLE DRUGS FOR TREATMENT OF STAPH INFECTIONS. CRITICAL RESULT CALLED TO, READ BACK BY AND VERIFIED WITH: PEACE DORMON @ 0622 ON 01/14/2012 HAJAM   Report Status 01/14/2012 FINAL   Final    Organism ID, Bacteria METHICILLIN RESISTANT STAPHYLOCOCCUS AUREUS   Final   CULTURE, BLOOD (ROUTINE X 2)     Status: Normal   Collection Time   01/11/12  3:03 PM      Component Value Range Status Comment   Specimen Description BLOOD RIGHT ARM  5 ML IN [facility] BOTTLE   Final    Special Requests NONE   Final    Culture  Setup Time 01/11/2012 22:25   Final    Culture     Final    Value: STAPHYLOCOCCUS AUREUS     Note: SUSCEPTIBILITIES PERFORMED ON PREVIOUS CULTURE WITHIN THE LAST 5 DAYS.     Note: Gram Stain Report Called to,Read Back By and Verified With: SHEILA MAIN @ 1746 ON 01/12/12 BY GOLLD   Report Status 01/14/2012 FINAL   Final   CULTURE, BLOOD (ROUTINE X 2)     Status: Normal   Collection Time   01/11/12  3:36 PM      Component Value Range Status Comment   Specimen Description BLOOD RIGHT HAND  3 ML IN [facility] BOTTLE   Final    Special Requests NONE   Final    Culture  Setup Time 01/11/2012 22:25   Final    Culture     Final    Value: METHICILLIN RESISTANT STAPHYLOCOCCUS AUREUS     Note: RIFAMPIN AND GENTAMICIN SHOULD NOT BE USED AS SINGLE DRUGS FOR TREATMENT OF STAPH INFECTIONS. CRITICAL RESULT CALLED TO, READ BACK BY AND VERIFIED WITH: PAM WEST BY INGRAM A 7/9 2P     Note: Gram Stain Report Called to,Read Back By and Verified With: CONRAD WHITROW 01/12/12 1050 BY SMITHERSJ   Report Status 01/14/2012 FINAL   Final    Organism ID, Bacteria METHICILLIN RESISTANT STAPHYLOCOCCUS AUREUS   Final   MRSA PCR SCREENING     Status: Abnormal   Collection Time   01/11/12  4:37 PM      Component Value Range Status Comment    MRSA by  PCR POSITIVE (*) NEGATIVE Final   CULTURE, BLOOD (ROUTINE X 2)     Status: Normal (Preliminary result)   Collection Time   01/13/12  1:20 PM      Component Value Range Status Comment   Specimen Description BLOOD RIGHT ARM   Final    Special Requests BOTTLES DRAWN AEROBIC AND ANAEROBIC 5 CC EACH   Final    Culture  Setup Time 01/13/2012 19:33   Final    Culture     Final    Value: GRAM POSITIVE COCCI IN CLUSTERS     Note: Gram Stain Report Called to,Read Back By and Verified With: CHRISTY @ 2334 ON 01/15/12 BY GOLLD   Report Status PENDING   Incomplete   CULTURE, BLOOD (ROUTINE X 2)     Status: Normal (Preliminary result)   Collection Time   01/13/12  1:25 PM      Component Value Range Status Comment   Specimen Description BLOOD RIGHT ARM   Final    Special Requests BOTTLES DRAWN AEROBIC AND ANAEROBIC 5CC EACH   Final    Culture  Setup Time 01/13/2012 19:33   Final    Culture     Final    Value:        BLOOD CULTURE RECEIVED NO GROWTH TO DATE CULTURE WILL BE HELD FOR 5 DAYS BEFORE ISSUING A FINAL NEGATIVE REPORT   Report Status PENDING   Incomplete      Studies: Mr Thoracic Spine Wo Contrast  01/16/2012  *RADIOLOGY REPORT*  Clinical Data: Back pain.  Bacteremia.  MRI THORACIC SPINE WITHOUT CONTRAST  Technique:  Multiplanar and multiecho pulse sequences of the thoracic spine were obtained without intravenous contrast.  Comparison: None.  Findings: There is no evidence of fracture in the thoracic region.  T1-2 and T2-3 are unremarkable.  The T3-4 disc shows anterior osteophyte formation.  No canal compromise.  In the region from T4- T7, the vertebral bodies appear fused.  The T7-8 disc shows degenerative desiccation but no bulge or herniation.  In the region from T8-T10, the vertebral bodies appear fused.  The T10-11 disc shows some Schmorl's nodes and some slightly increased water signal, but there are are no destructive changes or edematous changes in the adjacent vertebral bodies.   Therefore, this is felt unlikely to relate to infection.  The T11-12 disc shows mild degenerative change with lower level T2 signal.  The T12-L1 disc is unremarkable.  The spinal canal is widely patent.  Ample subarachnoid space surrounds the cord.  No cord pathology.  No facet arthropathy. Paravertebral soft tissues show bilateral pleural effusions with dependent pulmonary atelectasis.  There is either some loculated fluid or a cyst in the lower right pleural space.  IMPRESSION: No evidence of acute spinal pathology.  Segments of fusion, including from T4-T7 and T8-T10.  Mild degenerative changes at the disc levels that are not fused.  There is some T2 signal within the T10-11 and T11-12 discs, but the endplates do not appear irregular and there is no vertebral body edema.  Therefore, I think it is unlikely that there is diskitis at those levels.  Conceivably, early diskitis can look this way but I think that is unlikely.  Bilateral pleural effusions with pulmonary atelectasis.  Possible cyst or loculation in the right lower chest.  Original Report Authenticated By: Thomasenia Sales, M.D.    Scheduled Meds:    . aspirin  81 mg Oral Daily  . Chlorhexidine Gluconate Cloth  6 each  Topical Q0600  . diltiazem  120 mg Oral Daily  . dutasteride  0.5 mg Oral Daily  . metoprolol tartrate  25 mg Oral TID  . polyethylene glycol  17 g Oral Daily  . sertraline  150 mg Oral Daily  . sodium chloride  3 mL Intravenous Q12H  . Tamsulosin HCl  0.4 mg Oral QPC supper  . vancomycin  1,000 mg Intravenous Q24H   Continuous Infusions:    . sodium chloride 10 mL/hr at 01/16/12 1900     Marlin Canary [facility] Pager [phone] If 8PM-8AM, please contact night-coverage [email or web address] Password [email or web address] 01/17/2012, 10:22 AM   LOS: 6 days

## 2012-01-18 DIAGNOSIS — D649 Anemia, unspecified: Secondary | ICD-10-CM

## 2012-01-18 LAB — RENAL FUNCTION PANEL
BUN: 33 mg/dL — ABNORMAL HIGH (ref 6–23)
CO2: 29 mEq/L (ref 19–32)
Chloride: 99 mEq/L (ref 96–112)
Creatinine, Ser: 1.33 mg/dL (ref 0.50–1.35)
Glucose, Bld: 101 mg/dL — ABNORMAL HIGH (ref 70–99)
Potassium: 3.7 mEq/L (ref 3.5–5.1)

## 2012-01-18 LAB — CULTURE, BLOOD (ROUTINE X 2)

## 2012-01-18 NOTE — Progress Notes (Signed)
[facility] PROGRESS NOTE  Jack Stevens JYN:[id number] DOB: August 25, 1921 DOA: 01/11/2012 PCP: No primary provider on file.  Assessment/Plan: Principal Problem:  *Sepsis Active Problems:  Acute on chronic kidney disease, stage 3  UTI (lower urinary tract infection)  Dehydration  EKG abnormalities  Hyponatremia  Weakness generalized  Leukocytosis  BPH (benign prostatic hyperplasia)  Anemia  Acute respiratory distress  Hematuria  HTN (hypertension), LVH by volts  Bacteremia due to Staphylococcus aureus  Back pain  #1 sepsis  Likely secondary to UTI and bacteremia. Patient's tachycardia has improved on rate limiting medications and antibiotics. Respiratory rate is also improved. WBC is trending down  Lactic acid level is trending down  Pro calcitonin is trending down.  Urine cultures consistent with staph aureus. Blood cultures are pending and preliminary findings consistent with staph aureus in 2 out of 2 blood cultures..  IV vancomycin for at least 6 weeks since no TEE done. Follow- from [facility], will get PICC  Placed (repeat BC- 1/2 + for staph a)- appreciate ID's assistance   #2 acute respiratory distress  Likely secondary to problem #1. ABG with a pH of 7.4 PCO2 of 27 PO2 of 91 bicarbonate of 18 from 01/12/2012. Tachycardia PNA has improved. Lactic acid and pro calcitonin levels are trending down. Blood cultures and urine cultures are consistent with staph aureus. IV vancomycin   #3 Probable Bacteremia  Blood cultures results growing staph aureus 2 out of 2. Patient is afebrile. White count is trending down. Continue vancomycin day #5 of 6 weeks  #4 UTI  Urine cultures are pending with preliminary results showing staph aureus. IV vancomycin Follow.   #5 gross hematuria  Questionable etiology. Renal ultrasound consistent with bilateral hydronephrosis with a very heterogeneous bladder concerning for an obstructive bladder mass. Hematuria is slowly improving urine is  clearing up. Continue indwelling Foley catheter and irrigate as needed.  Urology is following and recommended cystoscopy when patient is medically stable. Per urology. CT scan abdomen and pelvis without IV contrast shows: Marked prostatic enlargement.  Diffuse bladder wall thickening, can be seen with inflammatory and  neoplastic processes; correlation with cystoscopy recommended.  Multiple bladder diverticula some which contain dependent calculi.  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  Mild right ureteral dilatation without hydronephrosis.  Sigmoid diverticulosis.  Hiatal hernia.  Bibasilar pleural effusions and atelectasis.  Extensive atherosclerotic disease. Leave foley in at D/C- follow up with urology for cysto as outpatient   #6 acute on chronic kidney disease stage III  Patient on discharge from [facility] on 01/06/2012 and a creatinine of 1.29. Admission creatinine was 3.95. Secondary to post renal azotemia likely secondary to obstruction from BPH in the setting of volume depletion and dehydration and sepsis.  Renal function is slowly trending down. Creatinine improving. Renal ultrasound is consistent with bilateral hydronephrosis.    #7 dehydration  IV fluids stopped  #8 EKG changes  Patient with shortness of breath however denies any chest pain. Cardiac enzymes are negative x3. 2-D echo with EF of 70-75%. No wall motion abnormalities. Repeat EKG with T wave inversion in leads 1 aVL V4 through V6. Aspirin started per cardiology will need to monitor closely with patient's hematuria. Patient also started on diltiazem and metoprolol. Per cardiology patient will need ischemic evaluation once medically stable with probable initial Myoview scan. Cardiology is following and appreciate input and recommendations.   BNP decreasing  #9 BPH  Foley catheter. Per urology. Continue Avodart, Flomax. Patient  will need a cystoscopy once medically  stable.   #10 hyponatremia  Likely secondary to volume depletion. TSH is within normal limits.  Improving with hydration.   #11 depression/anxiety  Continue home regimen of Zoloft and Xanax.   #12 hypertension  Patient started on metoprolol and diltiazem per cardiology. Follow.   #13 severe iron deficiency anemia/acute blood loss anemia  H&H stable now.  Hematuria is slowly clearing up. S/p 1 units PRBCs- stable. Once patient gets over this hospitalization if he has not had a colonoscopy done may benefit from 1. May need iron supplements on discharge.   #14 prophylaxis  PPI for GI prophylaxis. SCDs for DVT prophylaxis.  #15 hypokalemia- replace  #16 back pain Unsure if chronic or acute- MRI negative for infectious process in spine  #17 constipation - plan to make miralax daily, try to limit pain meds   Code Status: full Family Communication: spoke with daughter on phone last night- says this is her dad's baseline Disposition Plan: back to [facility]  for 6 more weeks of IV vanc  (hopefully Monday)    Consultants: Cards Urology ID  HPI/Subjective: Says not feeling much better- not sure of baseline Ate more today No fever, no chills     Objective: Filed Vitals:   01/17/12 1348 01/17/12 1614 01/17/12 2040 01/18/12 0543  BP: 133/75 126/67 119/67 147/68  Pulse: 67 74 63 77  Temp: 98.7 F (37.1 C)  97.5 F (36.4 C) 97.7 F (36.5 C)  TempSrc: Oral  Oral Oral  Resp: 16  17 17   Height:      Weight:    73.8 kg (162 lb 11.2 oz)  SpO2: 97%  97% 96%    Intake/Output Summary (Last 24 hours) at 01/18/12 0958 Last data filed at 01/18/12 0647  Gross per 24 hour  Intake    680 ml  Output   1050 ml  Net   -370 ml    Exam:  General: coopertative-pleasant, appears comfortable  HEENT: No bruits, no goiter.  Heart: RRR, without murmurs, rubs, gallops.  Lungs: Clear to auscultation bilaterally in anterior lung fields.  Abdomen: soft, minimally tender, mildly  distended, positive bowel sounds.  Extremities: No clubbing cyanosis or edema with positive pedal pulses.  Neuro: Grossly intact, nonfocal.    Data Reviewed: Basic Metabolic Panel:  Lab 01/18/12 4098 01/17/12 0512 01/16/12 0355 01/15/12 0320 01/14/12 0322 01/11/12 1159  NA 134* 133* 133* 136136 137 --  K 3.7 3.7 3.4* 3.2*3.3* 3.4* --  CL 99 97 98 106106 109 --  CO2 29 28 25  2322 20 --  GLUCOSE 101* 96 98 118*137* 87 --  BUN 33* 36* 39* 41*41* 52* --  CREATININE 1.33 1.37* 1.50* 1.60*1.63* 1.83* --  CALCIUM 9.0 9.1 8.9 8.98.6 8.7 --  MG -- -- -- 1.8 2.0 1.9  PHOS 2.9 2.9 2.9 2.5 3.2 --   Liver Function Tests:  Lab 01/18/12 0530 01/17/12 0512 01/16/12 0355 01/15/12 0320 01/14/12 0322 01/13/12 0327 01/12/12 0339 01/11/12 1159  AST -- -- -- -- -- 16 13 18   ALT -- -- -- -- -- 12 9 12   ALKPHOS -- -- -- -- -- 81 63 67  BILITOT -- -- -- -- -- 0.3 0.2* 0.2*  PROT -- -- -- -- -- 6.2 6.6 7.2  ALBUMIN 1.9* 1.9* 1.9* 1.8* 1.7* -- -- --   No results found for this basename: LIPASE:5,AMYLASE:5 in the last 168 hours No results found for this basename: AMMONIA:5 in the last 168  hours CBC:  Lab 01/16/12 0355 01/15/12 0320 01/14/12 0322 01/13/12 0327 01/12/12 0339 01/11/12 1159  WBC 9.5 8.4 10.2 15.6* 17.1* --  NEUTROABS -- -- 9.2* 14.5* 16.3* 20.4*  HGB 9.2* 7.8* 7.7* 7.5* 8.3* --  HCT 28.0* 23.8* 23.1* 22.2* 24.2* --  MCV 90.9 94.1 92.4 92.9 91.7 --  PLT 209 206 203 193 241 --   Cardiac Enzymes:  Lab 01/12/12 0339 01/11/12 1947 01/11/12 1159  CKTOTAL 34 53 60  CKMB 2.0 2.2 3.2  CKMBINDEX -- -- --  TROPONINI <0.30 <0.30 <0.30   BNP (last 3 results)  Basename 01/17/12 0512 01/14/12 0330  PROBNP 2586.0* 8047.0*   CBG: No results found for this basename: GLUCAP:5 in the last 168 hours  Recent Results (from the past 240 hour(s))  URINE CULTURE     Status: Normal   Collection Time   01/11/12 11:47 AM      Component Value Range Status Comment   Specimen Description  URINE, CATHETERIZED   Final    Special Requests NONE   Final    Culture  Setup Time 01/11/2012 16:43   Final    Colony Count >=100,000 COLONIES/ML   Final    Culture     Final    Value: METHICILLIN RESISTANT STAPHYLOCOCCUS AUREUS     Note: RIFAMPIN AND GENTAMICIN SHOULD NOT BE USED AS SINGLE DRUGS FOR TREATMENT OF STAPH INFECTIONS. CRITICAL RESULT CALLED TO, READ BACK BY AND VERIFIED WITH: PEACE DORMON @ 0622 ON 01/14/2012 HAJAM   Report Status 01/14/2012 FINAL   Final    Organism ID, Bacteria METHICILLIN RESISTANT STAPHYLOCOCCUS AUREUS   Final   CULTURE, BLOOD (ROUTINE X 2)     Status: Normal   Collection Time   01/11/12  3:03 PM      Component Value Range Status Comment   Specimen Description BLOOD RIGHT ARM  5 ML IN [facility] BOTTLE   Final    Special Requests NONE   Final    Culture  Setup Time 01/11/2012 22:25   Final    Culture     Final    Value: STAPHYLOCOCCUS AUREUS     Note: SUSCEPTIBILITIES PERFORMED ON PREVIOUS CULTURE WITHIN THE LAST 5 DAYS.     Note: Gram Stain Report Called to,Read Back By and Verified With: SHEILA MAIN @ 1746 ON 01/12/12 BY GOLLD   Report Status 01/14/2012 FINAL   Final   CULTURE, BLOOD (ROUTINE X 2)     Status: Normal   Collection Time   01/11/12  3:36 PM      Component Value Range Status Comment   Specimen Description BLOOD RIGHT HAND  3 ML IN [facility] BOTTLE   Final    Special Requests NONE   Final    Culture  Setup Time 01/11/2012 22:25   Final    Culture     Final    Value: METHICILLIN RESISTANT STAPHYLOCOCCUS AUREUS     Note: RIFAMPIN AND GENTAMICIN SHOULD NOT BE USED AS SINGLE DRUGS FOR TREATMENT OF STAPH INFECTIONS. CRITICAL RESULT CALLED TO, READ BACK BY AND VERIFIED WITH: PAM WEST BY INGRAM A 7/9 2P     Note: Gram Stain Report Called to,Read Back By and Verified With: CONRAD WHITROW 01/12/12 1050 BY SMITHERSJ   Report Status 01/14/2012 FINAL   Final    Organism ID, Bacteria METHICILLIN RESISTANT STAPHYLOCOCCUS AUREUS   Final   MRSA PCR SCREENING     Status:  Abnormal   Collection Time   01/11/12  4:37  PM      Component Value Range Status Comment   MRSA by PCR POSITIVE (*) NEGATIVE Final   CULTURE, BLOOD (ROUTINE X 2)     Status: Normal (Preliminary result)   Collection Time   01/13/12  1:20 PM      Component Value Range Status Comment   Specimen Description BLOOD RIGHT ARM   Final    Special Requests BOTTLES DRAWN AEROBIC AND ANAEROBIC 5 CC EACH   Final    Culture  Setup Time 01/13/2012 19:33   Final    Culture     Final    Value: STAPHYLOCOCCUS AUREUS     Note: RIFAMPIN AND GENTAMICIN SHOULD NOT BE USED AS SINGLE DRUGS FOR TREATMENT OF STAPH INFECTIONS.     Note: Gram Stain Report Called to,Read Back By and Verified With: CHRISTY @ 2334 ON 01/15/12 BY GOLLD   Report Status PENDING   Incomplete   CULTURE, BLOOD (ROUTINE X 2)     Status: Normal (Preliminary result)   Collection Time   01/13/12  1:25 PM      Component Value Range Status Comment   Specimen Description BLOOD RIGHT ARM   Final    Special Requests BOTTLES DRAWN AEROBIC AND ANAEROBIC 5CC EACH   Final    Culture  Setup Time 01/13/2012 19:33   Final    Culture     Final    Value:        BLOOD CULTURE RECEIVED NO GROWTH TO DATE CULTURE WILL BE HELD FOR 5 DAYS BEFORE ISSUING A FINAL NEGATIVE REPORT   Report Status PENDING   Incomplete      Studies: Mr Thoracic Spine Wo Contrast  01/16/2012  *RADIOLOGY REPORT*  Clinical Data: Back pain.  Bacteremia.  MRI THORACIC SPINE WITHOUT CONTRAST  Technique:  Multiplanar and multiecho pulse sequences of the thoracic spine were obtained without intravenous contrast.  Comparison: None.  Findings: There is no evidence of fracture in the thoracic region.  T1-2 and T2-3 are unremarkable.  The T3-4 disc shows anterior osteophyte formation.  No canal compromise.  In the region from T4- T7, the vertebral bodies appear fused.  The T7-8 disc shows degenerative desiccation but no bulge or herniation.  In the region from T8-T10, the vertebral bodies appear fused.   The T10-11 disc shows some Schmorl's nodes and some slightly increased water signal, but there are are no destructive changes or edematous changes in the adjacent vertebral bodies.  Therefore, this is felt unlikely to relate to infection.  The T11-12 disc shows mild degenerative change with lower level T2 signal.  The T12-L1 disc is unremarkable.  The spinal canal is widely patent.  Ample subarachnoid space surrounds the cord.  No cord pathology.  No facet arthropathy. Paravertebral soft tissues show bilateral pleural effusions with dependent pulmonary atelectasis.  There is either some loculated fluid or a cyst in the lower right pleural space.  IMPRESSION: No evidence of acute spinal pathology.  Segments of fusion, including from T4-T7 and T8-T10.  Mild degenerative changes at the disc levels that are not fused.  There is some T2 signal within the T10-11 and T11-12 discs, but the endplates do not appear irregular and there is no vertebral body edema.  Therefore, I think it is unlikely that there is diskitis at those levels.  Conceivably, early diskitis can look this way but I think that is unlikely.  Bilateral pleural effusions with pulmonary atelectasis.  Possible cyst or loculation in the right lower chest.  Original Report Authenticated By: Thomasenia Sales, M.D.    Scheduled Meds:    . ALPRAZolam  0.5 mg Oral TID  . aspirin  81 mg Oral Daily  . diltiazem  120 mg Oral Daily  . dutasteride  0.5 mg Oral Daily  . metoprolol tartrate  25 mg Oral TID  . polyethylene glycol  17 g Oral Daily  . sertraline  150 mg Oral Daily  . sodium chloride  3 mL Intravenous Q12H  . Tamsulosin HCl  0.4 mg Oral QPC supper  . vancomycin  1,000 mg Intravenous Q24H   Continuous Infusions:    . sodium chloride 20 mL/hr at 01/18/12 0647     Marlin Canary [facility] Pager [phone] If 8PM-8AM, please contact night-coverage [email or web address] Password [email or web address] 01/18/2012, 9:58 AM   LOS: 7 days

## 2012-01-18 NOTE — Progress Notes (Signed)
CRITICAL VALUE ALERT  Critical value received:  Blood Culture positive for MRSA  Date of notification:  01/18/12  Time of notification:  1145AM   Critical value read back:yes  Nurse who received alert:  Minna Merritts  MD notified (1st page):  Dr. Benjamine Mola  Time of first page:  1158AM  MD notified (2nd page):  Time of second page:  Responding MD:    Time MD responded:  1203

## 2012-01-18 NOTE — Progress Notes (Signed)
Patient had a missed beat on the monitor BP-119/75, HR-69 98%-2L. Patient denies any chest pain. Dr. Benjamine Mola notified, no new order given, will continue to assess patient.

## 2012-01-19 DIAGNOSIS — M549 Dorsalgia, unspecified: Secondary | ICD-10-CM

## 2012-01-19 LAB — RENAL FUNCTION PANEL
BUN: 31 mg/dL — ABNORMAL HIGH (ref 6–23)
CO2: 29 mEq/L (ref 19–32)
Chloride: 99 mEq/L (ref 96–112)
GFR calc Af Amer: 51 mL/min — ABNORMAL LOW (ref >90)
Glucose, Bld: 91 mg/dL (ref 70–99)
Phosphorus: 3.1 mg/dL (ref 2.3–4.6)
Potassium: 3.7 mEq/L (ref 3.5–5.1)
Sodium: 134 mEq/L — ABNORMAL LOW (ref 135–145)

## 2012-01-19 LAB — VANCOMYCIN, TROUGH: Vancomycin Tr: 16.7 ug/mL (ref 10.0–20.0)

## 2012-01-19 LAB — CULTURE, BLOOD (ROUTINE X 2)

## 2012-01-19 MED ORDER — ASPIRIN 81 MG PO CHEW: 81.0000 mg | CHEWABLE_TABLET | Freq: Every day | ORAL | Status: DC

## 2012-01-19 MED ORDER — OXYCODONE HCL 5 MG PO TABS: 5.0000 mg | ORAL_TABLET | ORAL | Status: DC | PRN

## 2012-01-19 MED ORDER — BISACODYL 5 MG PO TBEC: 10.0000 mg | DELAYED_RELEASE_TABLET | Freq: Every day | ORAL | Status: DC | PRN

## 2012-01-19 MED ORDER — DILTIAZEM HCL ER COATED BEADS 120 MG PO CP24: 120.0000 mg | ORAL_CAPSULE | Freq: Every day | ORAL | Status: AC

## 2012-01-19 MED ORDER — VANCOMYCIN HCL IN DEXTROSE 1-5 GM/200ML-% IV SOLN: 1000.0000 mg | INTRAVENOUS | Status: DC

## 2012-01-19 MED ORDER — HEPARIN SOD (PORK) LOCK FLUSH 100 UNIT/ML IV SOLN
250.0000 [IU] | INTRAVENOUS | Status: AC | PRN
  Administered 2012-01-19: 500 [IU]

## 2012-01-19 MED ORDER — METOPROLOL TARTRATE 25 MG PO TABS: 25.0000 mg | ORAL_TABLET | Freq: Three times a day (TID) | ORAL | Status: DC

## 2012-01-19 MED ORDER — POLYETHYLENE GLYCOL 3350 17 G PO PACK: 17.0000 g | PACK | Freq: Every day | ORAL | Status: AC

## 2012-01-19 MED ORDER — ALPRAZOLAM 0.5 MG PO TABS: 0.5000 mg | ORAL_TABLET | Freq: Three times a day (TID) | ORAL | Status: DC | PRN

## 2012-01-19 NOTE — Progress Notes (Signed)
Physical Therapy Treatment Patient Details Name: Jack Stevens MRN: [id number] DOB: 04/10/1922 Today's Date: 01/19/2012 Time: 1191-4782 PT Time Calculation (min): 16 min  PT Assessment / Plan / Recommendation Comments on Treatment Session  Pt continues to require +2 assist for mobility however able to assist with stand pivot over to recliner.    Follow Up Recommendations  Skilled nursing facility    Barriers to Discharge        Equipment Recommendations  Defer to next venue    Recommendations for Other Services    Frequency     Plan Discharge plan remains appropriate;Frequency remains appropriate    Precautions / Restrictions Precautions Precautions: Fall   Pertinent Vitals/Pain 5/10 low back pain, repositioned in recliner with pillows    Mobility  Bed Mobility Bed Mobility: Supine to Sit Supine to Sit: 1: +2 Total assist;HOB elevated;With rails Supine to Sit: Patient Percentage: 30% Sitting - Scoot to Edge of Bed: 4: Min assist;With rail Details for Bed Mobility Assistance: increased time and effort to assist with mobility Transfers Transfers: Stand to Sit;Sit to Stand;Stand Pivot Transfers Sit to Stand: 1: +2 Total assist;From bed;With upper extremity assist;From elevated surface Sit to Stand: Patient Percentage: 40% Stand to Sit: 1: +2 Total assist Stand to Sit: Patient Percentage: 30% Stand Pivot Transfers: 1: +2 Total assist Stand Pivot Transfers: Patient Percentage: 40% Details for Transfer Assistance: increased multimodal cues for safety and technique, cues for improving extension and posture, used RW for pivot, able to take a couple steps over to recliner with increased verbal cues    Exercises     PT Diagnosis:    PT Problem List:   PT Treatment Interventions:     PT Goals Acute Rehab PT Goals PT Goal: Supine/Side to Sit - Progress: Progressing toward goal PT Goal: Sit to Stand - Progress: Progressing toward goal PT Goal: Stand to Sit - Progress:  Progressing toward goal PT Transfer Goal: Bed to Chair/Chair to Bed - Progress: Progressing toward goal  Visit Information  Last PT Received On: 01/19/12 Assistance Needed: +2    Subjective Data  Subjective: pt continues to report back pain   Cognition  Overall Cognitive Status: No family/caregiver present to determine baseline cognitive functioning Area of Impairment: Attention;Following commands Arousal/Alertness: Awake/alert Behavior During Session: Jack Stevens for tasks performed Current Attention Level: Sustained Following Commands: Follows one step commands inconsistently;Follows one step commands with increased time    Balance     End of Session PT - End of Session Equipment Utilized During Treatment: Gait belt;Oxygen Activity Tolerance: Patient limited by fatigue Patient left: with call bell/phone within reach;in chair Nurse Communication: Need for lift equipment   GP     Jack Stevens,Jack Stevens 01/19/2012, 12:35 PM Pager: [phone]

## 2012-01-19 NOTE — Discharge Summary (Signed)
Physician Discharge Summary  [place] ZOX:[id number] DOB: 30-Sep-1921 DOA: 01/11/2012  PCP: No primary provider on file.  Admit date: 01/11/2012 Discharge date: 01/19/2012  Recommendations for Outpatient Follow-up:  1. vanc trough daily while on vancomycin 2. BMP weekly for cr and K 3. Urology for cysto- foley care 4. Cardiology for outpatient stress  Discharge Diagnoses:  Principal Problem:  *Sepsis Active Problems:  Acute on chronic kidney disease, stage 3  UTI (lower urinary tract infection)  Dehydration  EKG abnormalities  Hyponatremia  Weakness generalized  Leukocytosis  BPH (benign prostatic hyperplasia)  Anemia  Acute respiratory distress  Hematuria  HTN (hypertension), LVH by volts  Bacteremia due to Staphylococcus aureus  Back pain   Discharge Condition: improved  Diet recommendation: cardiac  History of present illness:  Jack Stevens is an 78 year old Caucasian gentleman with a history of chronic kidney disease stage III last creatinine of 1.29 on 01/06/2012 per discharge summary from [facility], history of BPH with chronic indwelling Foley catheter, history of MGUS, history of hypertension history of seizure history of depression history of anxiety history of PTSD history of anemia who was recently discharged from [facility] with acute on chronic kidney disease secondary to severe BPH and seizure disorder presenting to the ED with a one to two-day history of worsening hematuria. ED records and records from the nursing home. Patient is a poor historian and as such can't quite give a full history. When asked the patient why he is here patient states that he just feels very weak and feels his nerves are shot and not too sure. Patient thinks he lives at home in his own home however is from a facility since to be asked in place. There is no family at bedside and a such most of the history was obtained from the ED records. The ED records  the patient presented from [facility] place with generalized weakness and hematuria which have been ongoing for the past 2 days. Patient denies any fever, no chills, no chest pain, no nausea, no vomiting, no abdominal pain, no change in his chronic shortness of breath per patient, no dysuria no diarrhea no constipation no cough no other associated symptoms. Patient was seen in the ED labs which were drawn did show white count of 21.7 a hemoglobin of 9.2 sodium of 1:30 BUN of 69 creatinine of 3.95 urinalysis was consistent with a UTI chest x-ray was negative. Will call to admit the patient for further evaluation and management.   Hospital Course:  #1 sepsis  Likely secondary to UTI and bacteremia. BC: staph aureus in 2 out of 2 blood cultures.. IV vancomycin for at least 6 weeks total since no TEE done. PICC Placed (repeat BC- 1/2 + for staph a)  #2 acute respiratory distress  Resolved after diuresis   #3 Probable Bacteremia  IV vancomycin   #4 UTI  Urine cultures are pending with preliminary results showing staph aureus. IV vancomycin Follow.   #5 gross hematuria  Renal ultrasound consistent with bilateral hydronephrosis with a very heterogeneous bladder concerning for an obstructive bladder mass. Hematuria is slowly improving urine is clearing up. Continue indwelling Foley catheter and irrigate as needed. Urology  recommends cystoscopy when patient is medically stable. Leave foley in at D/C- follow up with urology for cysto as outpatient   #6 acute on chronic kidney disease stage III  Patient on discharge from [facility] on 01/06/2012 and a creatinine of 1.29. Admission creatinine was 3.95. Secondary to post renal azotemia  likely secondary to obstruction from BPH in the setting of volume depletion and dehydration and sepsis. Renal function is slowly trending down. Creatinine improving. Renal ultrasound is consistent with bilateral hydronephrosis.    #7 EKG changes  Patient with shortness of breath  however denies any chest pain. Cardiac enzymes are negative x3. 2-D echo with EF of 70-75%. No wall motion abnormalities. Repeat EKG with T wave inversion in leads 1 aVL V4 through V6. Aspirin started per cardiology will need to monitor closely with patient's hematuria. Patient also started on diltiazem and metoprolol. Per cardiology patient will need ischemic evaluation once medically stable with probable initial Myoview scan. Cardiology is following and appreciate input and recommendations. BNP decreased   #8 BPH  Foley catheter. Per urology. Continue Avodart, Flomax. Patient will need a cystoscopy once medically stable.   .  #9 depression/anxiety  Continue home regimen of Zoloft and Xanax.   #10 hypertension  Patient started on metoprolol and diltiazem per cardiology. Follow.   #11 severe iron deficiency anemia/acute blood loss anemia  H&H stable now. Hematuria is slowly clearing up. S/p 1 units PRBCs- stable. Once patient gets over this hospitalization if he has not had a colonoscopy done may benefit from 1.   #12back pain  Unsure if chronic or acute- MRI negative for infectious process in spine , PRN pain medications  #13 constipation  - plan to make miralax daily, try to limit pain meds    Consultations:  Urology  Cardiology  ID  Discharge Exam: Filed Vitals:   01/19/12 0640  BP: 130/65  Pulse: 71  Temp: 97.8 F (36.6 C)  Resp: 18   Filed Vitals:   01/18/12 1500 01/18/12 1653 01/18/12 2217 01/19/12 0640  BP: 119/75 125/68 123/65 130/65  Pulse: 69 65 76 71  Temp:   99.9 F (37.7 C) 97.8 F (36.6 C)  TempSrc:   Axillary Axillary  Resp:   19 18  Height:      Weight:      SpO2: 98%  97% 96%   General: NAD, getting out of bed into chair Cardiovascular: rrr Respiratory: clear Skin: no rashes or lesions- few bruises Neuro: moves all 4 extremities equally   Discharge Instructions   Medication List  As of 01/19/2012  8:43 AM   ASK your doctor about these  medications         acetaminophen 325 MG tablet   Commonly known as: TYLENOL   Take by mouth every 6 (six) hours as needed. 1 to 2 tablets every 4 hours as needed for pain or temperature exceeding 100.67F. Not to exceed 4gm APAP/24 hrs.      ALPRAZolam 0.5 MG tablet   Commonly known as: XANAX   Take 0.5 mg by mouth 3 (three) times daily as needed. Anxiety.      amLODipine 5 MG tablet   Commonly known as: NORVASC   Take 5 mg by mouth daily.      dutasteride 0.5 MG capsule   Commonly known as: AVODART   Take 0.5 mg by mouth daily.      ferrous sulfate 325 (65 FE) MG tablet   Take 325 mg by mouth daily with breakfast.      hydrocerin Crea   Apply 1 application topically 2 (two) times daily as needed. Apply to affected area on back every 12 hours as needed for rash.      HYDROcodone-acetaminophen 5-325 MG per tablet   Commonly known as: NORCO   Take by mouth  every 4 (four) hours as needed. Take 1 to 2 tablets every 4 hours as needed; not to exceed 4gm APAP/24 hrs.      metoprolol tartrate 25 MG tablet   Commonly known as: LOPRESSOR   Take 25 mg by mouth 2 (two) times daily.      multivitamin with minerals Tabs   Take 1 tablet by mouth daily.      nicotine 7 mg/24hr patch   Commonly known as: NICODERM CQ - dosed in mg/24 hr   Place 1 patch onto the skin daily.      nystatin powder   Commonly known as: MYCOSTATIN   Apply 1 Units topically 2 (two) times daily. To affected area (unspecified) for fungal infection.      ondansetron 4 MG disintegrating tablet   Commonly known as: ZOFRAN-ODT   Take 4 mg by mouth every 6 (six) hours as needed. Nausea/vomiting.      pantoprazole 40 MG tablet   Commonly known as: PROTONIX   Take 40 mg by mouth every morning.      sertraline 100 MG tablet   Commonly known as: ZOLOFT   Take 150 mg by mouth daily.      sodium chloride 0.9 % infusion   Inject 2 mLs into the vein every 6 (six) hours. For IV push.      Tamsulosin HCl 0.4 MG Caps     Commonly known as: FLOMAX   Take 0.4 mg by mouth daily. After a meal.      triamcinolone cream 0.1 %   Commonly known as: KENALOG   Apply 1 application topically 2 (two) times daily. Apply to rash on back until resolved.              The results of significant diagnostics from this hospitalization (including imaging, microbiology, ancillary and laboratory) are listed below for reference.    Significant Diagnostic Studies: Ct Abdomen Pelvis Wo Contrast  01/14/2012  *RADIOLOGY REPORT*  Clinical Data: Doses question bladder mass or diverticula on ultrasound  CT ABDOMEN AND PELVIS WITHOUT CONTRAST  Technique:  Multidetector CT imaging of the abdomen and pelvis was performed following the standard protocol without intravenous contrast. Oral contrast not administered.  Sagittal and coronal MPR images reconstructed from axial data set.  Comparison: Renal ultrasound 01/12/2012  Findings: Bibasilar pleural effusions and atelectasis. Mildly complicated cyst with wall calcification lateral aspect upper pole left kidney 4.1 x 4.4 x 4.0 cm. 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 No right hydronephrosis but mild right ureteral dilatation present. Marked enlargement of prostate gland, approximately 8.0 x 6.5 x 6.3 cm. Bilateral bladder diverticula are identified anterior and superior to the ureteral orifices, containing dependent calculi. Additional diverticulum at dome of bladder containing air. Foley catheter present. Diffuse bladder wall thickening identified, unable to exclude tumor.  Calcified granulomata in liver and spleen. Distended gallbladder. Scattered respiratory motion artifacts. No definite additional focal abnormalities of liver, spleen, pancreas, or adrenal glands. Moderate sized hiatal hernia. Extensive atherosclerotic calcifications. Normal appendix. Colonic diverticulosis. No mass, adenopathy, free fluid, or inflammatory process. No acute osseous  findings.  IMPRESSION: Marked prostatic enlargement. Diffuse bladder wall thickening, can be seen with inflammatory and neoplastic processes; correlation with cystoscopy recommended. Multiple bladder diverticula some which contain dependent calculi. 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 Mild right ureteral dilatation without hydronephrosis. Sigmoid diverticulosis. Hiatal hernia. Bibasilar pleural effusions and atelectasis. Extensive atherosclerotic disease.  Original Report  Authenticated By: Lollie Marrow, M.D.   Mr Thoracic Spine Wo Contrast  01/16/2012  *RADIOLOGY REPORT*  Clinical Data: Back pain.  Bacteremia.  MRI THORACIC SPINE WITHOUT CONTRAST  Technique:  Multiplanar and multiecho pulse sequences of the thoracic spine were obtained without intravenous contrast.  Comparison: None.  Findings: There is no evidence of fracture in the thoracic region.  T1-2 and T2-3 are unremarkable.  The T3-4 disc shows anterior osteophyte formation.  No canal compromise.  In the region from T4- T7, the vertebral bodies appear fused.  The T7-8 disc shows degenerative desiccation but no bulge or herniation.  In the region from T8-T10, the vertebral bodies appear fused.  The T10-11 disc shows some Schmorl's nodes and some slightly increased water signal, but there are are no destructive changes or edematous changes in the adjacent vertebral bodies.  Therefore, this is felt unlikely to relate to infection.  The T11-12 disc shows mild degenerative change with lower level T2 signal.  The T12-L1 disc is unremarkable.  The spinal canal is widely patent.  Ample subarachnoid space surrounds the cord.  No cord pathology.  No facet arthropathy. Paravertebral soft tissues show bilateral pleural effusions with dependent pulmonary atelectasis.  There is either some loculated fluid or a cyst in the lower right pleural space.  IMPRESSION: No evidence of acute spinal pathology.  Segments of  fusion, including from T4-T7 and T8-T10.  Mild degenerative changes at the disc levels that are not fused.  There is some T2 signal within the T10-11 and T11-12 discs, but the endplates do not appear irregular and there is no vertebral body edema.  Therefore, I think it is unlikely that there is diskitis at those levels.  Conceivably, early diskitis can look this way but I think that is unlikely.  Bilateral pleural effusions with pulmonary atelectasis.  Possible cyst or loculation in the right lower chest.  Original Report Authenticated By: Thomasenia Sales, M.D.   US Renal  01/12/2012  *RADIOLOGY REPORT*  Clinical Data: 78 year old male with hematuria, UTI, acute renal failure.  RENAL/URINARY TRACT ULTRASOUND COMPLETE  Comparison:  None.  Findings:  Right Kidney:  Hydronephrosis.  Evidence of echogenic debris in the right renal pelvis and proximal ureter.  Hydroureter is evident. Renal length 11.5 cm.  No focal right renal lesion is identified.  Left Kidney:  Hydronephrosis, moderate to severe in greater than that on the right.  No debris evident in the left renal collecting system.  Hydroureter is evident.  There is a superimposed simple appearing exophytic renal cyst measuring up to 48 mm diameter. Renal length is 12.9 cm.  Bladder:  At least partially decompressed, and a Foley catheter balloon is partially visible.  However, to complex appearing cystic structures are noted adjacent to the bladder.  These might be bladder diverticula containing debris, uncertain.  The bladder also has a heterogeneous appearance.  Furthermore there is a linear echogenic structure partially visible which might represent a stent, uncertain.  IMPRESSION: Left greater than right bilateral obstructive uropathy.  The obstructing etiology is not identified, but the bladder is very heterogeneous such that an obstructing bladder mass is not excluded. There is evidence of echogenic debris in the obstructed right side system, which may  indicate pyonephrosis.  Additionally, there may be bladder diverticula containing debris. Overall, recommend follow-up CT abdomen and pelvis (can be done without contrast if necessary) to better characterize.  Original Report Authenticated By: Harley Hallmark, M.D.   Dg Chest [facility]  01/12/2012  *RADIOLOGY REPORT*  Clinical Data: T kidney.  PORTABLE CHEST - 1 VIEW  Comparison: 01/11/2012.  Findings: Mild pulmonary vascular congestion.  Slight increased markings lung bases may represent atelectasis.  Subtle infiltrate not entirely excluded.  Hiatal hernia.  Heart size top normal. Minimally tortuous aorta.  No pneumothorax.  IMPRESSION: Pulmonary vascular congestion.  Slight increased markings lung bases as noted above.  Hiatal hernia.  Original Report Authenticated By: Fuller Canada, M.D.   Dg Chest Port 1 View  01/11/2012  *RADIOLOGY REPORT*  Clinical Data: Weakness.  Shortness of breath.  PORTABLE CHEST - 1 VIEW  Comparison: None.  Findings: Enlarged cardiac silhouette.  Moderately large hiatal hernia.  Clear lungs with normal vascularity.  Bilateral acromioclavicular joint degenerative changes.  IMPRESSION:  1.  Cardiomegaly. 2.  Moderately large hiatal hernia.  Original Report Authenticated By: Darrol Angel, M.D.    Microbiology: Recent Results (from the past 240 hour(s))  URINE CULTURE     Status: Normal   Collection Time   01/11/12 11:47 AM      Component Value Range Status Comment   Specimen Description URINE, CATHETERIZED   Final    Special Requests NONE   Final    Culture  Setup Time 01/11/2012 16:43   Final    Colony Count >=100,000 COLONIES/ML   Final    Culture     Final    Value: METHICILLIN RESISTANT STAPHYLOCOCCUS AUREUS     Note: RIFAMPIN AND GENTAMICIN SHOULD NOT BE USED AS SINGLE DRUGS FOR TREATMENT OF STAPH INFECTIONS. CRITICAL RESULT CALLED TO, READ BACK BY AND VERIFIED WITH: PEACE DORMON @ 0622 ON 01/14/2012 HAJAM   Report Status 01/14/2012 FINAL   Final    Organism ID,  Bacteria METHICILLIN RESISTANT STAPHYLOCOCCUS AUREUS   Final   CULTURE, BLOOD (ROUTINE X 2)     Status: Normal   Collection Time   01/11/12  3:03 PM      Component Value Range Status Comment   Specimen Description BLOOD RIGHT ARM  5 ML IN [facility] BOTTLE   Final    Special Requests NONE   Final    Culture  Setup Time 01/11/2012 22:25   Final    Culture     Final    Value: STAPHYLOCOCCUS AUREUS     Note: SUSCEPTIBILITIES PERFORMED ON PREVIOUS CULTURE WITHIN THE LAST 5 DAYS.     Note: Gram Stain Report Called to,Read Back By and Verified With: SHEILA MAIN @ 1746 ON 01/12/12 BY GOLLD   Report Status 01/14/2012 FINAL   Final   CULTURE, BLOOD (ROUTINE X 2)     Status: Normal   Collection Time   01/11/12  3:36 PM      Component Value Range Status Comment   Specimen Description BLOOD RIGHT HAND  3 ML IN [facility] BOTTLE   Final    Special Requests NONE   Final    Culture  Setup Time 01/11/2012 22:25   Final    Culture     Final    Value: METHICILLIN RESISTANT STAPHYLOCOCCUS AUREUS     Note: RIFAMPIN AND GENTAMICIN SHOULD NOT BE USED AS SINGLE DRUGS FOR TREATMENT OF STAPH INFECTIONS. CRITICAL RESULT CALLED TO, READ BACK BY AND VERIFIED WITH: PAM WEST BY INGRAM A 7/9 2P     Note: Gram Stain Report Called to,Read Back By and Verified With: CONRAD WHITROW 01/12/12 1050 BY SMITHERSJ   Report Status 01/14/2012 FINAL   Final    Organism ID, Bacteria METHICILLIN  RESISTANT STAPHYLOCOCCUS AUREUS   Final   MRSA PCR SCREENING     Status: Abnormal   Collection Time   01/11/12  4:37 PM      Component Value Range Status Comment   MRSA by PCR POSITIVE (*) NEGATIVE Final   CULTURE, BLOOD (ROUTINE X 2)     Status: Normal   Collection Time   01/13/12  1:20 PM      Component Value Range Status Comment   Specimen Description BLOOD RIGHT ARM   Final    Special Requests BOTTLES DRAWN AEROBIC AND ANAEROBIC 5 CC EACH   Final    Culture  Setup Time 01/13/2012 19:33   Final    Culture     Final    Value: METHICILLIN RESISTANT  STAPHYLOCOCCUS AUREUS     Note: RIFAMPIN AND GENTAMICIN SHOULD NOT BE USED AS SINGLE DRUGS FOR TREATMENT OF STAPH INFECTIONS.     Note: Gram Stain Report Called to,Read Back By and Verified With: CHRISTY @ 2334 ON 01/15/12 BY GOLLD CRITICAL RESULT CALLED TO, READ BACK BY AND VERIFIED WITH: RN [facility] ON 01/18/12 AT 1145 BY DTERRY   Report Status 01/18/2012 FINAL   Final    Organism ID, Bacteria METHICILLIN RESISTANT STAPHYLOCOCCUS AUREUS   Final   CULTURE, BLOOD (ROUTINE X 2)     Status: Normal   Collection Time   01/13/12  1:25 PM      Component Value Range Status Comment   Specimen Description BLOOD RIGHT ARM   Final    Special Requests BOTTLES DRAWN AEROBIC AND ANAEROBIC 5CC EACH   Final    Culture  Setup Time 01/13/2012 19:33   Final    Culture NO GROWTH 5 DAYS   Final    Report Status 01/19/2012 FINAL   Final      Labs: Basic Metabolic Panel:  Lab 01/19/12 4098 01/18/12 0530 01/17/12 0512 01/16/12 0355 01/15/12 0320 01/14/12 0322  NA 134* 134* 133* 133* 119147 --  K 3.7 3.7 3.7 3.4* 3.2*3.3* --  CL 99 99 97 98 106106 --  CO2 29 29 28 25  2322 --  GLUCOSE 91 101* 96 98 118*137* --  BUN 31* 33* 36* 39* 41*41* --  CREATININE 1.39* 1.33 1.37* 1.50* 1.60*1.63* --  CALCIUM 9.0 9.0 9.1 8.9 8.98.6 --  MG -- -- -- -- 1.8 2.0  PHOS 3.1 2.9 2.9 2.9 2.5 --   Liver Function Tests:  Lab 01/19/12 0330 01/18/12 0530 01/17/12 0512 01/16/12 0355 01/15/12 0320 01/13/12 0327  AST -- -- -- -- -- 16  ALT -- -- -- -- -- 12  ALKPHOS -- -- -- -- -- 81  BILITOT -- -- -- -- -- 0.3  PROT -- -- -- -- -- 6.2  ALBUMIN 1.8* 1.9* 1.9* 1.9* 1.8* --   No results found for this basename: LIPASE:5,AMYLASE:5 in the last 168 hours No results found for this basename: AMMONIA:5 in the last 168 hours CBC:  Lab 01/16/12 0355 01/15/12 0320 01/14/12 0322 01/13/12 0327  WBC 9.5 8.4 10.2 15.6*  NEUTROABS -- -- 9.2* 14.5*  HGB 9.2* 7.8* 7.7* 7.5*  HCT 28.0* 23.8* 23.1* 22.2*  MCV 90.9 94.1 92.4 92.9    PLT 209 206 203 193   Cardiac Enzymes: No results found for this basename: CKTOTAL:5,CKMB:5,CKMBINDEX:5,TROPONINI:5 in the last 168 hours BNP: BNP (last 3 results)  Basename 01/17/12 0512 01/14/12 0330  PROBNP 2586.0* 8047.0*   CBG: No results found for this basename: GLUCAP:5 in the last 168 hours  Time coordinating discharge: 45  Signed:  Ahtziry Saathoff  [facility] 01/19/2012, 8:43 AM

## 2012-01-19 NOTE — Progress Notes (Signed)
ANTIBIOTIC CONSULT NOTE - FOLLOW UP  Pharmacy Consult for Vanco Indication: MRSA Bacteremia and UTI  No Known Allergies  Patient Measurements: Height: 5\' 9"  (175.3 cm) Weight: 162 lb 11.2 oz (73.8 kg) IBW/kg (Calculated) : 70.7   Vital Signs: Temp: 97.8 F (36.6 C) (07/15 0640) Temp src: Axillary (07/15 0640) BP: 132/74 mmHg (07/15 0929) Pulse Rate: 80  (07/15 0929) Intake/Output from previous day: 07/14 0701 - 07/15 0700 In: 820 [P.O.:240; I.V.:380; IV Piggyback:200] Out: 1300 [Urine:1300] Intake/Output from this shift:    Labs:  Basename 01/19/12 0330 01/18/12 0530 01/17/12 0512  WBC -- -- --  HGB -- -- --  PLT -- -- --  LABCREA -- -- --  CREATININE 1.39* 1.33 1.37*   Estimated Creatinine Clearance: 37.4 ml/min (by C-G formula based on Cr of 1.39).  Basename 01/19/12 0900 01/17/12 0932  VANCOTROUGH 16.7 15.0  VANCOPEAK -- --  VANCORANDOM -- --  GENTTROUGH -- --  GENTPEAK -- --  GENTRANDOM -- --  TOBRATROUGH -- --  TOBRAPEAK -- --  TOBRARND -- --  AMIKACINPEAK -- --  AMIKACINTROU -- --  AMIKACIN -- --    Assessment:  77 yo M on Day #7 vanco for MRSA bacteremia and UTI.   MD opted to continue Vancomycin outpatient for at least 6 weeks without r/o endocarditis.  ARF now resolved.  Pt's baseline Scr was 1.9, now 1.4  Vancomycin trough rechecked this AM and was therapeutic.  MD plan discharge patient today to [facility] place.    Repeat blood culture 7/9 still 1/2 positive for MRSA.   Goal of Therapy:  Vancomycin trough level 15-20 mcg/ml  Plan:   Continue Vancomycin 1gm IV q24h upon discharge  Communicated with MD, pt will need weekly Vancomycin trough while on Vancomycin.  A trough would needed earlier if there is a change in Scr.    Geoffry Paradise, PharmD, BCPS Pager: [phone] 10:30 AM

## 2012-01-19 NOTE — Plan of Care (Signed)
Problem: Phase II Progression Outcomes Goal: Progress activity as tolerated unless otherwise ordered Outcome: Not Progressing Poor activity tolerance

## 2012-01-19 NOTE — Progress Notes (Signed)
Occupational Therapy Treatment Patient Details Name: Dina Mobley MRN: [id number] DOB: 12/08/1921 Today's Date: 01/19/2012 Time: 7829-5621 and 1205 to 1221 OT Time Calculation (min): 13 min+ 8 = 13  OT Assessment / Plan / Recommendation Comments on Treatment Session      Follow Up Recommendations  Skilled nursing facility    Barriers to Discharge       Equipment Recommendations  Defer to next venue    Recommendations for Other Services    Frequency Min 1X/week   Plan Discharge plan remains appropriate    Precautions / Restrictions Precautions Precautions: Fall Restrictions Weight Bearing Restrictions: No   Pertinent Vitals/Pain C/o back pain; repositioned    ADL  Eating/Feeding: Performed;Supervision/safety (min cues to continue:  no appetite) ADL Comments: saw pt for AAROM; pt did not initiate AROM without support.   pt groggy today.  Came back at lunch for self-feeding.  Pt was able to do this with set up; supervision and min cues to continue task.  Pt has no appetite.  No spillage noted.      OT Diagnosis:    OT Problem List:   OT Treatment Interventions:     OT Goals Acute Rehab OT Goals Time For Goal Achievement: 01/27/12 ADL Goals Pt Will Perform Eating: with set-up;Supported;Sitting, chair ADL Goal: Eating - Progress: Progressing toward goals Additional ADL Goal #2: Pt will perform 2 sets 10 AROM to L shoulder to increase strength for adls ADL Goal: Additional Goal #2 - Progress: Progressing toward goals  Visit Information  Last OT Received On: 01/19/12 Assistance Needed: +2    Subjective Data      Prior Functioning       Cognition  Overall Cognitive Status: No family/caregiver present to determine baseline cognitive functioning Area of Impairment: Attention Arousal/Alertness: Lethargic Behavior During Session: Lethargic Current Attention Level: Sustained Following Commands: Follows one step commands inconsistently;Follows one step commands  with increased time    Mobility Bed Mobility Bed Mobility: Supine to Sit Supine to Sit: 1: +2 Total assist;HOB elevated;With rails Supine to Sit: Patient Percentage: 30% Sitting - Scoot to Edge of Bed: 4: Min assist;With rail Details for Bed Mobility Assistance: increased time and effort to assist with mobility Transfers Sit to Stand: 1: +2 Total assist;From bed;With upper extremity assist;From elevated surface Sit to Stand: Patient Percentage: 40% Stand to Sit: 1: +2 Total assist Stand to Sit: Patient Percentage: 30% Details for Transfer Assistance: increased multimodal cues for safety and technique, cues for improving extension and posture, used RW for pivot, able to take a couple steps over to recliner with increased verbal cues   Exercises    Balance    End of Session OT - End of Session Activity Tolerance: Patient limited by fatigue Patient left: in bed;with call bell/phone within reach  GO     Tara Wich 01/19/2012, 1:25 PM Marica Otter, OTR/L [phone] 01/19/2012

## 2012-01-19 NOTE — Progress Notes (Signed)
Patient set to discharge back to [facility] today. Daughter, [name] made aware. [facility] called for transport.  Unice Bailey, LCSW [facility] Clinical Social Worker cell #: [phone]

## 2012-01-27 ENCOUNTER — Emergency Department (HOSPITAL_COMMUNITY): Payer: Medicare Other

## 2012-01-27 ENCOUNTER — Inpatient Hospital Stay (HOSPITAL_COMMUNITY)
Admission: EM | Admit: 2012-01-27 | Discharge: 2012-01-29 | DRG: 194 | Disposition: A | Discharge status: Hospice | Payer: Medicare Other | Attending: Internal Medicine | Admitting: Internal Medicine

## 2012-01-27 ENCOUNTER — Encounter (HOSPITAL_COMMUNITY): Payer: Self-pay | Admitting: *Deleted

## 2012-01-27 DIAGNOSIS — R5381 Other malaise: Secondary | ICD-10-CM | POA: Diagnosis present

## 2012-01-27 DIAGNOSIS — R319 Hematuria, unspecified: Secondary | ICD-10-CM

## 2012-01-27 DIAGNOSIS — R627 Adult failure to thrive: Secondary | ICD-10-CM

## 2012-01-27 DIAGNOSIS — N139 Obstructive and reflux uropathy, unspecified: Secondary | ICD-10-CM | POA: Diagnosis present

## 2012-01-27 DIAGNOSIS — N400 Enlarged prostate without lower urinary tract symptoms: Secondary | ICD-10-CM

## 2012-01-27 DIAGNOSIS — F17200 Nicotine dependence, unspecified, uncomplicated: Secondary | ICD-10-CM | POA: Diagnosis present

## 2012-01-27 DIAGNOSIS — D62 Acute posthemorrhagic anemia: Secondary | ICD-10-CM

## 2012-01-27 DIAGNOSIS — M549 Dorsalgia, unspecified: Secondary | ICD-10-CM

## 2012-01-27 DIAGNOSIS — N189 Chronic kidney disease, unspecified: Secondary | ICD-10-CM | POA: Diagnosis present

## 2012-01-27 DIAGNOSIS — R531 Weakness: Secondary | ICD-10-CM

## 2012-01-27 DIAGNOSIS — Z9981 Dependence on supplemental oxygen: Secondary | ICD-10-CM

## 2012-01-27 DIAGNOSIS — I10 Essential (primary) hypertension: Secondary | ICD-10-CM

## 2012-01-27 DIAGNOSIS — F4310 Post-traumatic stress disorder, unspecified: Secondary | ICD-10-CM | POA: Diagnosis present

## 2012-01-27 DIAGNOSIS — Z515 Encounter for palliative care: Secondary | ICD-10-CM

## 2012-01-27 DIAGNOSIS — J189 Pneumonia, unspecified organism: Principal | ICD-10-CM | POA: Diagnosis present

## 2012-01-27 DIAGNOSIS — N138 Other obstructive and reflux uropathy: Secondary | ICD-10-CM | POA: Diagnosis present

## 2012-01-27 DIAGNOSIS — R195 Other fecal abnormalities: Secondary | ICD-10-CM

## 2012-01-27 DIAGNOSIS — N401 Enlarged prostate with lower urinary tract symptoms: Secondary | ICD-10-CM | POA: Diagnosis present

## 2012-01-27 DIAGNOSIS — B370 Candidal stomatitis: Secondary | ICD-10-CM | POA: Diagnosis present

## 2012-01-27 DIAGNOSIS — E871 Hypo-osmolality and hyponatremia: Secondary | ICD-10-CM

## 2012-01-27 DIAGNOSIS — R0603 Acute respiratory distress: Secondary | ICD-10-CM

## 2012-01-27 DIAGNOSIS — Z8614 Personal history of Methicillin resistant Staphylococcus aureus infection: Secondary | ICD-10-CM

## 2012-01-27 DIAGNOSIS — R0600 Dyspnea, unspecified: Secondary | ICD-10-CM

## 2012-01-27 DIAGNOSIS — N179 Acute kidney failure, unspecified: Secondary | ICD-10-CM

## 2012-01-27 DIAGNOSIS — R9431 Abnormal electrocardiogram [ECG] [EKG]: Secondary | ICD-10-CM

## 2012-01-27 DIAGNOSIS — N289 Disorder of kidney and ureter, unspecified: Secondary | ICD-10-CM

## 2012-01-27 DIAGNOSIS — N390 Urinary tract infection, site not specified: Secondary | ICD-10-CM

## 2012-01-27 DIAGNOSIS — A419 Sepsis, unspecified organism: Secondary | ICD-10-CM

## 2012-01-27 DIAGNOSIS — F418 Other specified anxiety disorders: Secondary | ICD-10-CM | POA: Diagnosis present

## 2012-01-27 DIAGNOSIS — D649 Anemia, unspecified: Secondary | ICD-10-CM

## 2012-01-27 DIAGNOSIS — D72829 Elevated white blood cell count, unspecified: Secondary | ICD-10-CM

## 2012-01-27 DIAGNOSIS — R7881 Bacteremia: Secondary | ICD-10-CM

## 2012-01-27 DIAGNOSIS — Z66 Do not resuscitate: Secondary | ICD-10-CM | POA: Diagnosis present

## 2012-01-27 DIAGNOSIS — F341 Dysthymic disorder: Secondary | ICD-10-CM | POA: Diagnosis present

## 2012-01-27 DIAGNOSIS — E860 Dehydration: Secondary | ICD-10-CM

## 2012-01-27 DIAGNOSIS — R0902 Hypoxemia: Secondary | ICD-10-CM | POA: Diagnosis present

## 2012-01-27 DIAGNOSIS — N183 Chronic kidney disease, stage 3 unspecified: Secondary | ICD-10-CM

## 2012-01-27 HISTORY — DX: Urinary tract infection, site not specified: N39.0

## 2012-01-27 HISTORY — DX: Presence of urogenital implants: Z96.0

## 2012-01-27 HISTORY — DX: Presence of other specified devices: Z97.8

## 2012-01-27 HISTORY — DX: Bacteremia: R78.81

## 2012-01-27 LAB — CBC WITH DIFFERENTIAL/PLATELET
Hemoglobin: 7.4 g/dL — ABNORMAL LOW (ref 13.0–17.0)
Lymphocytes Relative: 6 % — ABNORMAL LOW (ref 12–46)
Lymphs Abs: 0.8 10*3/uL (ref 0.7–4.0)
MCV: 91.5 fL (ref 78.0–100.0)
Monocytes Relative: 3 % (ref 3–12)
Neutrophils Relative %: 89 % — ABNORMAL HIGH (ref 43–77)
Platelets: 373 10*3/uL (ref 150–400)
RBC: 2.48 MIL/uL — ABNORMAL LOW (ref 4.22–5.81)
WBC: 13.5 10*3/uL — ABNORMAL HIGH (ref 4.0–10.5)

## 2012-01-27 LAB — URINE MICROSCOPIC-ADD ON

## 2012-01-27 LAB — BASIC METABOLIC PANEL
BUN: 49 mg/dL — ABNORMAL HIGH (ref 6–23)
CO2: 28 mEq/L (ref 19–32)
Chloride: 100 mEq/L (ref 96–112)
Glucose, Bld: 95 mg/dL (ref 70–99)
Potassium: 4 mEq/L (ref 3.5–5.1)
Sodium: 135 mEq/L (ref 135–145)

## 2012-01-27 LAB — URINALYSIS, ROUTINE W REFLEX MICROSCOPIC
Glucose, UA: NEGATIVE mg/dL
Protein, ur: NEGATIVE mg/dL
Specific Gravity, Urine: 1.021 (ref 1.005–1.030)
pH: 5 (ref 5.0–8.0)

## 2012-01-27 LAB — PREPARE RBC (CROSSMATCH)

## 2012-01-27 LAB — PRO B NATRIURETIC PEPTIDE: Pro B Natriuretic peptide (BNP): 3867 pg/mL — ABNORMAL HIGH (ref 0–450)

## 2012-01-27 LAB — ABO/RH: ABO/RH(D): A POS

## 2012-01-27 LAB — MRSA PCR SCREENING: MRSA by PCR: NEGATIVE

## 2012-01-27 LAB — OCCULT BLOOD, POC DEVICE: Fecal Occult Bld: POSITIVE

## 2012-01-27 MED ORDER — HYDROCERIN EX CREA
1.0000 "application " | TOPICAL_CREAM | Freq: Two times a day (BID) | CUTANEOUS | Status: DC | PRN
  Filled 2012-01-27: qty 113

## 2012-01-27 MED ORDER — DILTIAZEM HCL ER COATED BEADS 120 MG PO CP24
120.0000 mg | ORAL_CAPSULE | Freq: Every day | ORAL | Status: DC
  Administered 2012-01-27 – 2012-01-29 (×3): 120 mg via ORAL
  Filled 2012-01-27 (×3): qty 1

## 2012-01-27 MED ORDER — SODIUM CHLORIDE 0.9 % IJ SOLN: 3.0000 mL | INTRAMUSCULAR | Status: DC | PRN

## 2012-01-27 MED ORDER — CERTA PLUS PO TABS: 1.0000 | ORAL_TABLET | Freq: Every day | ORAL | Status: DC

## 2012-01-27 MED ORDER — GUAIFENESIN ER 600 MG PO TB12
600.0000 mg | ORAL_TABLET | Freq: Two times a day (BID) | ORAL | Status: DC
  Administered 2012-01-27: 600 mg via ORAL
  Filled 2012-01-27 (×3): qty 1

## 2012-01-27 MED ORDER — VANCOMYCIN HCL 1000 MG IV SOLR
750.0000 mg | INTRAVENOUS | Status: DC
  Filled 2012-01-27: qty 750

## 2012-01-27 MED ORDER — ONDANSETRON HCL 4 MG PO TABS: 4.0000 mg | ORAL_TABLET | Freq: Four times a day (QID) | ORAL | Status: DC | PRN

## 2012-01-27 MED ORDER — PIPERACILLIN-TAZOBACTAM 3.375 G IVPB
3.3750 g | Freq: Once | INTRAVENOUS | Status: AC
  Administered 2012-01-27: 3.375 g via INTRAVENOUS
  Filled 2012-01-27: qty 50

## 2012-01-27 MED ORDER — SODIUM CHLORIDE 0.9 % IV SOLN
Freq: Once | INTRAVENOUS | Status: AC
  Administered 2012-01-27: 100 mL/h via INTRAVENOUS

## 2012-01-27 MED ORDER — IPRATROPIUM BROMIDE 0.02 % IN SOLN
0.5000 mg | Freq: Three times a day (TID) | RESPIRATORY_TRACT | Status: DC
  Administered 2012-01-27 – 2012-01-29 (×5): 0.5 mg via RESPIRATORY_TRACT
  Filled 2012-01-27 (×5): qty 2.5

## 2012-01-27 MED ORDER — SODIUM CHLORIDE 0.9 % IV SOLN
250.0000 mg | Freq: Four times a day (QID) | INTRAVENOUS | Status: DC
  Administered 2012-01-27 – 2012-01-28 (×3): 250 mg via INTRAVENOUS
  Filled 2012-01-27 (×5): qty 250

## 2012-01-27 MED ORDER — BISACODYL 5 MG PO TBEC: 10.0000 mg | DELAYED_RELEASE_TABLET | Freq: Every day | ORAL | Status: DC | PRN

## 2012-01-27 MED ORDER — FERROUS SULFATE 325 (65 FE) MG PO TABS
325.0000 mg | ORAL_TABLET | Freq: Every day | ORAL | Status: DC
  Administered 2012-01-28: 325 mg via ORAL
  Filled 2012-01-27 (×2): qty 1

## 2012-01-27 MED ORDER — SODIUM CHLORIDE 0.9 % IV SOLN
INTRAVENOUS | Status: AC
  Administered 2012-01-27: 21:00:00 via INTRAVENOUS

## 2012-01-27 MED ORDER — NYSTATIN 100000 UNIT/GM EX POWD
Freq: Two times a day (BID) | CUTANEOUS | Status: DC
  Administered 2012-01-27 – 2012-01-28 (×3): via TOPICAL
  Filled 2012-01-27: qty 15

## 2012-01-27 MED ORDER — IPRATROPIUM BROMIDE 0.02 % IN SOLN: 0.5000 mg | RESPIRATORY_TRACT | Status: DC | PRN

## 2012-01-27 MED ORDER — VANCOMYCIN HCL IN DEXTROSE 1-5 GM/200ML-% IV SOLN
1000.0000 mg | Freq: Once | INTRAVENOUS | Status: AC
  Administered 2012-01-27: 1000 mg via INTRAVENOUS
  Filled 2012-01-27 (×2): qty 200

## 2012-01-27 MED ORDER — SODIUM CHLORIDE 0.9 % IV SOLN
250.0000 mL | INTRAVENOUS | Status: DC | PRN
  Administered 2012-01-28: 250 mL via INTRAVENOUS

## 2012-01-27 MED ORDER — ALBUTEROL SULFATE (5 MG/ML) 0.5% IN NEBU: 2.5000 mg | INHALATION_SOLUTION | RESPIRATORY_TRACT | Status: DC | PRN

## 2012-01-27 MED ORDER — MORPHINE SULFATE 2 MG/ML IJ SOLN: 2.0000 mg | INTRAMUSCULAR | Status: DC | PRN

## 2012-01-27 MED ORDER — METOPROLOL TARTRATE 25 MG PO TABS
25.0000 mg | ORAL_TABLET | Freq: Three times a day (TID) | ORAL | Status: DC
Start: 2012-01-27 — End: 2012-01-29
  Administered 2012-01-27 – 2012-01-29 (×5): 25 mg via ORAL
  Filled 2012-01-27 (×7): qty 1

## 2012-01-27 MED ORDER — FLUCONAZOLE 100 MG PO TABS
100.0000 mg | ORAL_TABLET | Freq: Every day | ORAL | Status: DC
  Administered 2012-01-27 – 2012-01-29 (×3): 100 mg via ORAL
  Filled 2012-01-27 (×3): qty 1

## 2012-01-27 MED ORDER — OXYCODONE HCL 5 MG PO TABS: 5.0000 mg | ORAL_TABLET | ORAL | Status: DC | PRN

## 2012-01-27 MED ORDER — NYSTATIN 100000 UNIT/GM EX POWD: Freq: Two times a day (BID) | CUTANEOUS | Status: DC

## 2012-01-27 MED ORDER — PANTOPRAZOLE SODIUM 40 MG PO TBEC
40.0000 mg | DELAYED_RELEASE_TABLET | Freq: Every morning | ORAL | Status: DC
Start: 2012-01-28 — End: 2012-01-29
  Administered 2012-01-28: 40 mg via ORAL
  Filled 2012-01-27: qty 1

## 2012-01-27 MED ORDER — SODIUM CHLORIDE 0.9 % IJ SOLN
3.0000 mL | Freq: Two times a day (BID) | INTRAMUSCULAR | Status: DC
  Administered 2012-01-27: 3 mL via INTRAVENOUS

## 2012-01-27 MED ORDER — ONDANSETRON HCL 4 MG/2ML IJ SOLN: 4.0000 mg | Freq: Four times a day (QID) | INTRAMUSCULAR | Status: DC | PRN

## 2012-01-27 MED ORDER — MICONAZOLE NITRATE 2 % EX CREA
1.0000 "application " | TOPICAL_CREAM | Freq: Two times a day (BID) | CUTANEOUS | Status: DC
  Filled 2012-01-27: qty 14

## 2012-01-27 MED ORDER — ASPIRIN 81 MG PO CHEW
81.0000 mg | CHEWABLE_TABLET | Freq: Every day | ORAL | Status: DC
  Filled 2012-01-27: qty 1

## 2012-01-27 MED ORDER — MICONAZOLE NITRATE 2 % EX CREA
TOPICAL_CREAM | Freq: Two times a day (BID) | CUTANEOUS | Status: DC
  Administered 2012-01-27 – 2012-01-28 (×3): via TOPICAL
  Filled 2012-01-27: qty 14

## 2012-01-27 MED ORDER — ALPRAZOLAM 0.25 MG PO TABS: 0.5000 mg | ORAL_TABLET | Freq: Three times a day (TID) | ORAL | Status: DC | PRN

## 2012-01-27 MED ORDER — TAMSULOSIN HCL 0.4 MG PO CAPS
0.4000 mg | ORAL_CAPSULE | Freq: Every day | ORAL | Status: DC
  Administered 2012-01-27 – 2012-01-29 (×3): 0.4 mg via ORAL
  Filled 2012-01-27 (×3): qty 1

## 2012-01-27 MED ORDER — TRIAMCINOLONE ACETONIDE 0.1 % EX CREA
1.0000 "application " | TOPICAL_CREAM | Freq: Two times a day (BID) | CUTANEOUS | Status: DC
  Administered 2012-01-27 – 2012-01-28 (×3): 1 via TOPICAL
  Filled 2012-01-27: qty 15

## 2012-01-27 MED ORDER — ADULT MULTIVITAMIN W/MINERALS CH
1.0000 | ORAL_TABLET | Freq: Every day | ORAL | Status: DC
Start: 2012-01-28 — End: 2012-01-28
  Filled 2012-01-27: qty 1

## 2012-01-27 MED ORDER — SERTRALINE HCL 50 MG PO TABS
150.0000 mg | ORAL_TABLET | Freq: Every day | ORAL | Status: DC
  Administered 2012-01-27 – 2012-01-29 (×3): 150 mg via ORAL
  Filled 2012-01-27 (×3): qty 1

## 2012-01-27 MED ORDER — ACETAMINOPHEN 325 MG PO TABS: 650.0000 mg | ORAL_TABLET | Freq: Four times a day (QID) | ORAL | Status: DC | PRN

## 2012-01-27 MED ORDER — DUTASTERIDE 0.5 MG PO CAPS
0.5000 mg | ORAL_CAPSULE | Freq: Every day | ORAL | Status: DC
  Administered 2012-01-27: 0.5 mg via ORAL
  Filled 2012-01-27 (×2): qty 1

## 2012-01-27 MED ORDER — ALBUTEROL SULFATE (5 MG/ML) 0.5% IN NEBU
2.5000 mg | INHALATION_SOLUTION | Freq: Three times a day (TID) | RESPIRATORY_TRACT | Status: DC
  Administered 2012-01-27 – 2012-01-29 (×5): 2.5 mg via RESPIRATORY_TRACT
  Filled 2012-01-27 (×5): qty 0.5

## 2012-01-27 MED ORDER — POLYETHYLENE GLYCOL 3350 17 G PO PACK
17.0000 g | PACK | Freq: Every day | ORAL | Status: DC
  Filled 2012-01-27: qty 1

## 2012-01-27 MED ORDER — ACETAMINOPHEN 650 MG RE SUPP: 650.0000 mg | Freq: Four times a day (QID) | RECTAL | Status: DC | PRN

## 2012-01-27 MED ORDER — FUROSEMIDE 10 MG/ML IJ SOLN
20.0000 mg | Freq: Once | INTRAMUSCULAR | Status: DC
  Filled 2012-01-27: qty 2

## 2012-01-27 NOTE — ED Notes (Signed)
Pt from [facility], pt sent over for low hemoglobin (7.7) drawn on 7/23 with low blood pressure.

## 2012-01-27 NOTE — H&P (Signed)
PCP:   No primary provider on file.   Chief Complaint:  Referred from [facility], for anemia, with hemoglobin of 7.7.  HPI: This is an 78 year old male with known history of chronic kidney disease stage III (baseline creatinine of 1.29 on 01/06/2012), BPH with chronic indwelling Foley catheter, MGUS, hypertension, seizure disorder, depression, anxiety, PTSD and chronic anemia. He is s/p hospitalization at [facility] 01/11/12-01/19/12, for sepsis secondary to MRSA UTI and Methicillin Resistant Staph Aureus bacteremia, treated with iv Vancomycin, continued on discharge, via PICC. During that hospitalization, he had gross hematuria, and renal ultrasound demonstrated bilateral hydronephrosis with a very heterogeneous bladder concerning for an obstructive bladder mass. He was to follow up with Dr Lindaann Slough, urologist, for cystoscopy, following discharge. Patient is a poor historian, and history is therefore gleaned from ED PA, and supplemented from EMR and discussion with patient's daughter, via phone. .     Allergies:  No Known Allergies    Past Medical History  Diagnosis Date  . Renal disorder   . Chronic renal failure   . BPH (benign prostatic hyperplasia)   . Seizure   . Hypertension   . Depression   . MGUS (monoclonal gammopathy of unknown significance)   . Anxiety   . PTSD (post-traumatic stress disorder)   . Diverticulitis   . Anemia   . Pneumonia   . Urinary tract infection   . Bacteremia   . Chronic indwelling foley catheter     Past Surgical History  Procedure Date  . Tonsillectomy     Prior to Admission medications   Medication Sig Start Date End Date Taking? Authorizing Provider  acetaminophen (TYLENOL) 325 MG tablet Take by mouth every 6 (six) hours as needed. 1 to 2 tablets every 4 hours as needed for pain or temperature exceeding 100.65F. Not to exceed 4gm APAP/24 hrs.   Yes Historical Provider, MD  ALPRAZolam Prudy Feeler) 0.5 MG tablet Take 1 tablet  (0.5 mg total) by mouth 3 (three) times daily as needed for anxiety. Anxiety. 01/19/12  Yes Joseph Art, DO  bisacodyl (DULCOLAX) 5 MG EC tablet Take 10 mg by mouth daily as needed. For constipation   Yes Historical Provider, MD  diltiazem (CARDIZEM CD) 120 MG 24 hr capsule Take 1 capsule (120 mg total) by mouth daily. 01/19/12 01/18/13 Yes Jessica U Vann, DO  dutasteride (AVODART) 0.5 MG capsule Take 0.5 mg by mouth daily.   Yes Historical Provider, MD  ferrous sulfate 325 (65 FE) MG tablet Take 325 mg by mouth daily with breakfast.   Yes Historical Provider, MD  hydrocerin (EUCERIN) CREA Apply 1 application topically 2 (two) times daily as needed. Apply to affected area on back every 12 hours as needed for rash.   Yes Historical Provider, MD  metoprolol tartrate (LOPRESSOR) 25 MG tablet Take 1 tablet (25 mg total) by mouth 3 (three) times daily. 01/19/12 01/18/13 Yes Jessica U Vann, DO  miconazole (MICOTIN) 2 % cream Apply 1 application topically 2 (two) times daily.   Yes Historical Provider, MD  Multiple Vitamins-Minerals (CERTA PLUS) TABS Take 1 tablet by mouth daily.   Yes Historical Provider, MD  nystatin (MYCOSTATIN) powder Apply 1 Units topically 2 (two) times daily. To affected area (unspecified) for fungal infection.   Yes Historical Provider, MD  ondansetron (ZOFRAN-ODT) 4 MG disintegrating tablet Take 4 mg by mouth every 6 (six) hours as needed. Nausea/vomiting.   Yes Historical Provider, MD  oxycodone (OXY-IR) 5 MG capsule Take  5 mg by mouth every 4 (four) hours as needed. For pain   Yes Historical Provider, MD  pantoprazole (PROTONIX) 40 MG tablet Take 40 mg by mouth every morning.   Yes Historical Provider, MD  polyethylene glycol (MIRALAX / GLYCOLAX) packet Take 17 g by mouth daily.   Yes Historical Provider, MD  sertraline (ZOLOFT) 100 MG tablet Take 150 mg by mouth daily.   Yes Historical Provider, MD  Tamsulosin HCl (FLOMAX) 0.4 MG CAPS Take 0.4 mg by mouth daily. After a meal.   Yes  Historical Provider, MD  triamcinolone cream (KENALOG) 0.1 % Apply 1 application topically 2 (two) times daily. Apply to rash on back until resolved.   Yes Historical Provider, MD  Vancomycin HCl in Dextrose (VANCOCIN HCL IV) Inject 750 mg into the vein daily. 750 mg IV in 250 ml of Normal Saline * Infuse slowly over 90 minutes   Yes Historical Provider, MD  aspirin 81 MG chewable tablet Chew 1 tablet (81 mg total) by mouth daily. 01/19/12 01/18/13  Joseph Art, DO    Social History:  reports that he has been smoking.  He does not have any smokeless tobacco history on file. He reports that he does not drink alcohol or use illicit drugs.  History reviewed. No pertinent family history.  Review of Systems:  As per HPI and chief complaint. Patent denies diminished appetite, weight loss, fever, chills, headache, blurred vision, difficulty in speaking, dysphagia or chest pain. He does have an occasional cough, but no shortness of breath, orthopnea, paroxysmal nocturnal dyspnea, nausea, diaphoresis, abdominal pain, vomiting, diarrhea, belching, heartburn, hematemesis, melena, dysuria, nocturia, urinary frequency, hematochezia, lower extremity swelling, pain, or redness. The rest of the systems review is negative.  Physical Exam:  General:  Patient does not appear to be in obvious acute distress, although he has a frequent "fruity " cough. Alert, communicative, talking in complete sentences, not short of breath at rest.  HEENT:  Moderate clinical pallor, no jaundice, no conjunctival injection or discharge. Has oral thrush. NECK:  Supple, JVP not seen, no carotid bruits, no palpable lymphadenopathy, no palpable goiter. CHEST:  No wheezes, has crackles right lung base. HEART:  Sounds 1 and 2 heard, normal, regular, no murmurs. ABDOMEN:  Full, soft, non-tender, no palpable organomegaly, no palpable masses, normal bowel sounds. GENITALIA:  Not examined. LOWER EXTREMITIES:  No pitting edema, palpable  peripheral pulses. MUSCULOSKELETAL SYSTEM:  Generalized osteoarthritic changes, otherwise, normal. CENTRAL NERVOUS SYSTEM:  No focal neurologic deficit on gross examination.  Labs on Admission:  Results for orders placed during the hospital encounter of 01/27/12 (from the past 48 hour(s))  OCCULT BLOOD, POC DEVICE     Status: Normal   Collection Time   01/27/12  4:12 PM      Component Value Range Comment   Fecal Occult Bld POSITIVE     TYPE AND SCREEN     Status: Normal   Collection Time   01/27/12  4:20 PM      Component Value Range Comment   ABO/RH(D) A POS      Antibody Screen NEG      Sample Expiration 01/30/2012     CBC WITH DIFFERENTIAL     Status: Abnormal   Collection Time   01/27/12  4:20 PM      Component Value Range Comment   WBC 13.5 (*) 4.0 - 10.5 K/uL    RBC 2.48 (*) 4.22 - 5.81 MIL/uL    Hemoglobin 7.4 (*) 13.0 - 17.0  g/dL    HCT 40.9 (*) 81.1 - 52.0 %    MCV 91.5  78.0 - 100.0 fL    MCH 29.8  26.0 - 34.0 pg    MCHC 32.6  30.0 - 36.0 g/dL    RDW 91.4  78.2 - 95.6 %    Platelets 373  150 - 400 K/uL    Neutrophils Relative 89 (*) 43 - 77 %    Neutro Abs 12.1 (*) 1.7 - 7.7 K/uL    Lymphocytes Relative 6 (*) 12 - 46 %    Lymphs Abs 0.8  0.7 - 4.0 K/uL    Monocytes Relative 3  3 - 12 %    Monocytes Absolute 0.4  0.1 - 1.0 K/uL    Eosinophils Relative 1  0 - 5 %    Eosinophils Absolute 0.1  0.0 - 0.7 K/uL    Basophils Relative 0  0 - 1 %    Basophils Absolute 0.1  0.0 - 0.1 K/uL   BASIC METABOLIC PANEL     Status: Abnormal   Collection Time   01/27/12  4:20 PM      Component Value Range Comment   Sodium 135  135 - 145 mEq/L    Potassium 4.0  3.5 - 5.1 mEq/L    Chloride 100  96 - 112 mEq/L    CO2 28  19 - 32 mEq/L    Glucose, Bld 95  70 - 99 mg/dL    BUN 49 (*) 6 - 23 mg/dL    Creatinine, Ser 2.13 (*) 0.50 - 1.35 mg/dL    Calcium 9.5  8.4 - 08.6 mg/dL    GFR calc non Af Amer 31 (*) >90 mL/min    GFR calc Af Amer 35 (*) >90 mL/min   LACTIC ACID, PLASMA      Status: Normal   Collection Time   01/27/12  4:20 PM      Component Value Range Comment   Lactic Acid, Venous 1.0  0.5 - 2.2 mmol/L   PRO B NATRIURETIC PEPTIDE     Status: Abnormal   Collection Time   01/27/12  4:20 PM      Component Value Range Comment   Pro B Natriuretic peptide (BNP) 3867.0 (*) 0 - 450 pg/mL   ABO/RH     Status: Normal   Collection Time   01/27/12  4:20 PM      Component Value Range Comment   ABO/RH(D) A POS     URINALYSIS, ROUTINE W REFLEX MICROSCOPIC     Status: Abnormal   Collection Time   01/27/12  4:22 PM      Component Value Range Comment   Color, Urine YELLOW  YELLOW    APPearance CLOUDY (*) CLEAR    Specific Gravity, Urine 1.021  1.005 - 1.030    pH 5.0  5.0 - 8.0    Glucose, UA NEGATIVE  NEGATIVE mg/dL    Hgb urine dipstick LARGE (*) NEGATIVE    Bilirubin Urine NEGATIVE  NEGATIVE    Ketones, ur NEGATIVE  NEGATIVE mg/dL    Protein, ur NEGATIVE  NEGATIVE mg/dL    Urobilinogen, UA 0.2  0.0 - 1.0 mg/dL    Nitrite NEGATIVE  NEGATIVE    Leukocytes, UA LARGE (*) NEGATIVE   URINE MICROSCOPIC-ADD ON     Status: Abnormal   Collection Time   01/27/12  4:22 PM      Component Value Range Comment   Squamous Epithelial / LPF FEW (*)  RARE    WBC, UA TOO NUMEROUS TO COUNT  <3 WBC/hpf    RBC / HPF 7-10  <3 RBC/hpf    Bacteria, UA MANY (*) RARE    Crystals CA OXALATE CRYSTALS (*) NEGATIVE     Radiological Exams on Admission: *RADIOLOGY REPORT*  Clinical Data: Shortness of breath, hypertension  CHEST - 2 VIEW  Comparison: 01/12/2012, 01/11/2012  Findings: Right PICC line tip in the SVC RA junction. Very low lung volumes noted. Cardiomegaly evident with vascular congestion and increased streaky basilar atelectasis/airspace disease. Lower lobe pneumonia not excluded. No large effusion or pneumothorax. No definite CHF. Small effusions suspected on the lateral view.  IMPRESSION: Perihilar and lower lobe streaky atelectasis and/or airspace disease. Basilar  pneumonia not excluded.  Cardiomegaly without definite superimposed CHF  Small effusions  Original Report Authenticated By: Judie Petit. Ruel Favors, M.D.        Assessment/Plan Active Problems: 1. HCAP (healthcare-associated pneumonia): Patient has a productive cough, focal auscultative findings, and CXR findings consistent with nosocomial pneumonia. As he is already on iv Vancomycin, we shall broaden coverage with Primaxin, and send off blood cultures.  2. Hypoxia: Oxygen saturation was in the 80s on initial evaluation in the ED, but improved with oxygen supplementation, which we shall continue. This is likely due to airspace disease, described above. We shall also utilize bronchodilators and Mucinex.  3. Oral thrush: This was an incidental finding on physical examination,and will be treated with a 7-day course of Diflucan.  4. Normocytic Anemia: This is chronic, and Hemoglobin was 9.2 on 01/16/12, per EMR. Today, hemoglobin is 7.4. Patient has had no overt GI bleed, but is stool guaiac positive, in the ED. Besides, during his last hospitalization, he did have a gross hematuria, although this is now, no longer evident. We shall do hematinic studies, and transfuse a unit of PRBC. Further blood transfusion, may be needed. Gi consultation may prove of benefit, and should be strongly considered.  5. Obstructive uropathy: During his last hospitalization, renal; ultrasound revealed bilateral hydronephrosis with a very heterogeneous bladder concerning for an obstructive bladder mass. He was to follow up with Dr Lindaann Slough, urologist, for cystoscopy, following discharge, but it is not clear wether this has been followed through upon.  6. UTI: Patient has a positive urinary sediment, with significant pyuria and many bacteria. He was found to have MRSA UTI during his last hospitalization, and is still on Vancomycin treatment. Perhaps the current findings represent colonization, given his chronic in-dwelling  catheter. We shall send off a urine culture, but Primaxin should prove adequate additional coverage.  7. CKD (chronic kidney disease): Patient has CKD 3, with baseline creatinine of 1.39 on 01/19/12. Today, creatinine is elevated beyond baseline, at 1.88. Renal indices have to be followed closely.  8. Depression with anxiety: mood appears table at this time.  9. PTSD (post-traumatic stress disorder): Stable.   Further management will depend on clinical course.    Comment: I called and spoke with patient's daughter [name], who is currently in in [place] (Tel: [phone]). I have fully updated her on patients clinical condition/problems. She has confirmed that patient is DNR/DNI.  Time Spent on Admission: 1 hour.   Travares Nelles,CHRISTOPHER 01/27/2012, 7:05 PM

## 2012-01-27 NOTE — ED Provider Notes (Signed)
History     CSN: [id number]  Arrival date & time 01/27/12  1549   First MD Initiated Contact with Patient 01/27/12 1552      Chief Complaint  Patient presents with  . Anemia  . Hypotension    (Consider location/radiation/quality/duration/timing/severity/associated sxs/prior treatment) HPI 78 year old male who presents from nursing facility. Patient himself is a poor historian and level V caveat applies. Per documentation sent with the patient, the patient's hemoglobin was drawn today and was low at 7.7. Patient's currently at a nursing facility for rehabilitation and was recently discharged from the hospital with urosepsis. Patient cannot offer any history. He states that he does wear oxygen at home "sometimes". He denies any cough or congestion, chest or abdominal pain.  Past Medical History  Diagnosis Date  . Renal disorder   . Chronic renal failure   . BPH (benign prostatic hyperplasia)   . Seizure   . Hypertension   . Depression   . MGUS (monoclonal gammopathy of unknown significance)   . Anxiety   . PTSD (post-traumatic stress disorder)   . Diverticulitis   . Anemia   . Pneumonia   . Urinary tract infection   . Bacteremia   . Chronic indwelling foley catheter     Past Surgical History  Procedure Date  . Tonsillectomy     History reviewed. No pertinent family history.  History  Substance Use Topics  . Smoking status: Current Some Day Smoker  . Smokeless tobacco: Not on file  . Alcohol Use: No      Review of Systems  Unable to perform ROS: Dementia    Allergies  Review of patient's allergies indicates no known allergies.  Home Medications   Current Outpatient Rx  Name Route Sig Dispense Refill  . ACETAMINOPHEN 325 MG PO TABS Oral Take by mouth every 6 (six) hours as needed. 1 to 2 tablets every 4 hours as needed for pain or temperature exceeding 100.41F. Not to exceed 4gm APAP/24 hrs.    . ALPRAZOLAM 0.5 MG PO TABS Oral Take 1 tablet (0.5 mg total)  by mouth 3 (three) times daily as needed for anxiety. Anxiety. 15 tablet 0  . BISACODYL 5 MG PO TBEC Oral Take 10 mg by mouth daily as needed. For constipation    . DILTIAZEM HCL ER COATED BEADS 120 MG PO CP24 Oral Take 1 capsule (120 mg total) by mouth daily.    . DUTASTERIDE 0.5 MG PO CAPS Oral Take 0.5 mg by mouth daily.    Marland Kitchen FERROUS SULFATE 325 (65 FE) MG PO TABS Oral Take 325 mg by mouth daily with breakfast.    . HYDROCERIN EX CREA Topical Apply 1 application topically 2 (two) times daily as needed. Apply to affected area on back every 12 hours as needed for rash.    Marland Kitchen METOPROLOL TARTRATE 25 MG PO TABS Oral Take 1 tablet (25 mg total) by mouth 3 (three) times daily.    Marland Kitchen MICONAZOLE NITRATE 2 % EX CREA Topical Apply 1 application topically 2 (two) times daily.    Maxwell Marion PLUS PO TABS Oral Take 1 tablet by mouth daily.    . NYSTATIN 100000 UNIT/GM EX POWD Topical Apply 1 Units topically 2 (two) times daily. To affected area (unspecified) for fungal infection.    Marland Kitchen ONDANSETRON 4 MG PO TBDP Oral Take 4 mg by mouth every 6 (six) hours as needed. Nausea/vomiting.    . OXYCODONE HCL 5 MG PO CAPS Oral Take 5 mg by mouth  every 4 (four) hours as needed. For pain    . PANTOPRAZOLE SODIUM 40 MG PO TBEC Oral Take 40 mg by mouth every morning.    Marland Kitchen POLYETHYLENE GLYCOL 3350 PO PACK Oral Take 17 g by mouth daily.    . SERTRALINE HCL 100 MG PO TABS Oral Take 150 mg by mouth daily.    Marland Kitchen TAMSULOSIN HCL 0.4 MG PO CAPS Oral Take 0.4 mg by mouth daily. After a meal.    . TRIAMCINOLONE ACETONIDE 0.1 % EX CREA Topical Apply 1 application topically 2 (two) times daily. Apply to rash on back until resolved.    Marland Kitchen VANCOCIN HCL IV Intravenous Inject 750 mg into the vein daily. 750 mg IV in 250 ml of Normal Saline * Infuse slowly over 90 minutes    . ASPIRIN 81 MG PO CHEW Oral Chew 1 tablet (81 mg total) by mouth daily.      BP 114/58  Pulse 79  Temp 98.2 F (36.8 C) (Oral)  Resp 25  SpO2 97%  Physical Exam    Nursing note and vitals reviewed. Constitutional: He appears well-developed and well-nourished. No distress.  HENT:  Head: Normocephalic and atraumatic.  Mouth/Throat: Oropharynx is clear and moist.  Eyes: EOM are normal. Pupils are equal, round, and reactive to light.  Neck: Normal range of motion.  Cardiovascular: Normal rate, regular rhythm and normal heart sounds.   Pulmonary/Chest: Effort normal.       Patient initially hypoxic on room air to 85%, place on 2 L and improved to the high 90s Crackles heard over the left lung fields. Decreased air movement throughout.  Abdominal: Soft. There is no tenderness.  Genitourinary:       Foley in place, urine straw colored Dark, heme+ stool  Musculoskeletal: Normal range of motion. He exhibits no edema.  Neurological: He is alert. No cranial nerve deficit.  Skin: Skin is warm and dry. He is not diaphoretic.  Psychiatric: He has a normal mood and affect.    ED Course  Procedures (including critical care time)  Labs Reviewed  CBC WITH DIFFERENTIAL - Abnormal; Notable for the following:    WBC 13.5 (*)     RBC 2.48 (*)     Hemoglobin 7.4 (*)     HCT 22.7 (*)     Neutrophils Relative 89 (*)     Neutro Abs 12.1 (*)     Lymphocytes Relative 6 (*)     All other components within normal limits  BASIC METABOLIC PANEL - Abnormal; Notable for the following:    BUN 49 (*)     Creatinine, Ser 1.88 (*)     GFR calc non Af Amer 31 (*)     GFR calc Af Amer 35 (*)     All other components within normal limits  URINALYSIS, ROUTINE W REFLEX MICROSCOPIC - Abnormal; Notable for the following:    APPearance CLOUDY (*)     Hgb urine dipstick LARGE (*)     Leukocytes, UA LARGE (*)     All other components within normal limits  PRO B NATRIURETIC PEPTIDE - Abnormal; Notable for the following:    Pro B Natriuretic peptide (BNP) 3867.0 (*)     All other components within normal limits  URINE MICROSCOPIC-ADD ON - Abnormal; Notable for the following:     Squamous Epithelial / LPF FEW (*)     Bacteria, UA MANY (*)     Crystals CA OXALATE CRYSTALS (*)     All  other components within normal limits  TYPE AND SCREEN  LACTIC ACID, PLASMA  OCCULT BLOOD, POC DEVICE  CULTURE, BLOOD (ROUTINE X 2)  CULTURE, BLOOD (ROUTINE X 2)  ABO/RH   Dg Chest 2 View  01/27/2012  *RADIOLOGY REPORT*  Clinical Data: Shortness of breath, hypertension  CHEST - 2 VIEW  Comparison: 01/12/2012, 01/11/2012  Findings: Right PICC line tip in the SVC RA junction.  Very low lung volumes noted.  Cardiomegaly evident with vascular congestion and increased streaky basilar atelectasis/airspace disease.  Lower lobe pneumonia not excluded.  No large effusion or pneumothorax. No definite CHF.  Small effusions suspected on the lateral view.  IMPRESSION: Perihilar and lower lobe streaky atelectasis and/or airspace disease.  Basilar pneumonia not excluded.  Cardiomegaly without definite superimposed CHF  Small effusions  Original Report Authenticated By: Judie Petit. Ruel Favors, M.D.     1. HCAP (healthcare-associated pneumonia)   2. Renal insufficiency   3. Anemia   4. UTI (lower urinary tract infection)       MDM  Pt presents with likely basilar pneumonia as he was hypoxic on RA initially, has adventitious breath sounds, and has evidence of airspace disease on CXR (I personally reviewed the plain films). This is HCAP as pt was recently dced; started on vanc, zosyn, cultures drawn. Evidence of leukocytosis, renal insufficiency, and anemia on remainder of labs. Additional evidence of UTI. Normal lactate. Pt will be admitted for tx. Discussed with [facility]; accepted for admission to team 2.      Grant Fontana, PA-C 01/27/12 1819  Addendum (ECG interpretation)  Date: 01/27/2012  Rate: 78  Rhythm: normal sinus rhythm  QRS Axis: normal  Intervals: PR prolonged  ST/T Wave abnormalities: nonspecific T wave changes  Conduction Disutrbances:none  Narrative Interpretation:   Old EKG  Reviewed: as compared with 01/12/12 rate decreased, PR longer  Grant Fontana, PA-C 01/27/12 2012

## 2012-01-27 NOTE — Progress Notes (Signed)
ANTIBIOTIC CONSULT NOTE - INITIAL  Pharmacy Consult for vancomycin and imipenem Indication: hx of MRSA UTI and bacteremia and now r/o hospital acquired pneumonia  No Known Allergies  Patient Measurements: Weight: 162 lb 11.2 oz (73.8 kg) Adjusted Body Weight:  Vital Signs: Temp: 98.2 F (36.8 C) (07/23 1605) Temp src: Oral (07/23 1605) BP: 123/65 mmHg (07/23 1745) Pulse Rate: 95  (07/23 1745) Intake/Output from previous day:   Intake/Output from this shift:    Labs:  Basename 01/27/12 1620  WBC 13.5*  HGB 7.4*  PLT 373  LABCREA --  CREATININE 1.88*   The CrCl is unknown because both a height and weight (above a minimum accepted value) are required for this calculation. No results found for this basename: VANCOTROUGH:2,VANCOPEAK:2,VANCORANDOM:2,GENTTROUGH:2,GENTPEAK:2,GENTRANDOM:2,TOBRATROUGH:2,TOBRAPEAK:2,TOBRARND:2,AMIKACINPEAK:2,AMIKACINTROU:2,AMIKACIN:2, in the last 72 hours   Microbiology: Recent Results (from the past 720 hour(s))  URINE CULTURE     Status: Normal   Collection Time   01/11/12 11:47 AM      Component Value Range Status Comment   Specimen Description URINE, CATHETERIZED   Final    Special Requests NONE   Final    Culture  Setup Time 01/11/2012 16:43   Final    Colony Count >=100,000 COLONIES/ML   Final    Culture     Final    Value: METHICILLIN RESISTANT STAPHYLOCOCCUS AUREUS     Note: RIFAMPIN AND GENTAMICIN SHOULD NOT BE USED AS SINGLE DRUGS FOR TREATMENT OF STAPH INFECTIONS. CRITICAL RESULT CALLED TO, READ BACK BY AND VERIFIED WITH: PEACE DORMON @ 0622 ON 01/14/2012 HAJAM   Report Status 01/14/2012 FINAL   Final    Organism ID, Bacteria METHICILLIN RESISTANT STAPHYLOCOCCUS AUREUS   Final   CULTURE, BLOOD (ROUTINE X 2)     Status: Normal   Collection Time   01/11/12  3:03 PM      Component Value Range Status Comment   Specimen Description BLOOD RIGHT ARM  5 ML IN [facility] BOTTLE   Final    Special Requests NONE   Final    Culture  Setup Time  01/11/2012 22:25   Final    Culture     Final    Value: STAPHYLOCOCCUS AUREUS     Note: SUSCEPTIBILITIES PERFORMED ON PREVIOUS CULTURE WITHIN THE LAST 5 DAYS.     Note: Gram Stain Report Called to,Read Back By and Verified With: SHEILA MAIN @ 1746 ON 01/12/12 BY GOLLD   Report Status 01/14/2012 FINAL   Final   CULTURE, BLOOD (ROUTINE X 2)     Status: Normal   Collection Time   01/11/12  3:36 PM      Component Value Range Status Comment   Specimen Description BLOOD RIGHT HAND  3 ML IN [facility] BOTTLE   Final    Special Requests NONE   Final    Culture  Setup Time 01/11/2012 22:25   Final    Culture     Final    Value: METHICILLIN RESISTANT STAPHYLOCOCCUS AUREUS     Note: RIFAMPIN AND GENTAMICIN SHOULD NOT BE USED AS SINGLE DRUGS FOR TREATMENT OF STAPH INFECTIONS. CRITICAL RESULT CALLED TO, READ BACK BY AND VERIFIED WITH: PAM WEST BY INGRAM A 7/9 2P     Note: Gram Stain Report Called to,Read Back By and Verified With: CONRAD WHITROW 01/12/12 1050 BY SMITHERSJ   Report Status 01/14/2012 FINAL   Final    Organism ID, Bacteria METHICILLIN RESISTANT STAPHYLOCOCCUS AUREUS   Final   MRSA PCR SCREENING     Status: Abnormal  Collection Time   01/11/12  4:37 PM      Component Value Range Status Comment   MRSA by PCR POSITIVE (*) NEGATIVE Final   CULTURE, BLOOD (ROUTINE X 2)     Status: Normal   Collection Time   01/13/12  1:20 PM      Component Value Range Status Comment   Specimen Description BLOOD RIGHT ARM   Final    Special Requests BOTTLES DRAWN AEROBIC AND ANAEROBIC 5 CC EACH   Final    Culture  Setup Time 01/13/2012 19:33   Final    Culture     Final    Value: METHICILLIN RESISTANT STAPHYLOCOCCUS AUREUS     Note: RIFAMPIN AND GENTAMICIN SHOULD NOT BE USED AS SINGLE DRUGS FOR TREATMENT OF STAPH INFECTIONS.     Note: Gram Stain Report Called to,Read Back By and Verified With: CHRISTY @ 2334 ON 01/15/12 BY GOLLD CRITICAL RESULT CALLED TO, READ BACK BY AND VERIFIED WITH: RN [facility] ON 01/18/12 AT 1145  BY DTERRY   Report Status 01/18/2012 FINAL   Final    Organism ID, Bacteria METHICILLIN RESISTANT STAPHYLOCOCCUS AUREUS   Final   CULTURE, BLOOD (ROUTINE X 2)     Status: Normal   Collection Time   01/13/12  1:25 PM      Component Value Range Status Comment   Specimen Description BLOOD RIGHT ARM   Final    Special Requests BOTTLES DRAWN AEROBIC AND ANAEROBIC [facility] EACH   Final    Culture  Setup Time 01/13/2012 19:33   Final    Culture NO GROWTH 5 DAYS   Final    Report Status 01/19/2012 FINAL   Final     Medical History: Past Medical History  Diagnosis Date  . Renal disorder   . Chronic renal failure   . BPH (benign prostatic hyperplasia)   . Seizure   . Hypertension   . Depression   . MGUS (monoclonal gammopathy of unknown significance)   . Anxiety   . PTSD (post-traumatic stress disorder)   . Diverticulitis   . Anemia   . Pneumonia   . Urinary tract infection   . Bacteremia   . Chronic indwelling foley catheter     Medications:  Scheduled:    . sodium chloride   Intravenous Once  . sodium chloride   Intravenous STAT  . ipratropium  0.5 mg Nebulization TID   And  . albuterol  2.5 mg Nebulization TID  . aspirin  81 mg Oral Daily  . Certa Plus  1 tablet Oral Daily  . diltiazem  120 mg Oral Daily  . dutasteride  0.5 mg Oral Daily  . ferrous sulfate  325 mg Oral Q breakfast  . fluconazole  100 mg Oral Daily  . guaiFENesin  600 mg Oral BID  . metoprolol tartrate  25 mg Oral TID  . miconazole  1 application Topical BID  . nystatin   Topical BID  . pantoprazole  40 mg Oral q morning - 10a  . piperacillin-tazobactam (ZOSYN)  IV  3.375 g Intravenous Once  . polyethylene glycol  17 g Oral Daily  . sertraline  150 mg Oral Daily  . sodium chloride  3 mL Intravenous Q12H  . Tamsulosin HCl  0.4 mg Oral Daily  . triamcinolone cream  1 application Topical BID  . vancomycin  1,000 mg Intravenous Once   Infusions:   Assessment: 78yo male s/p hospitalization at [facility]  was on vancomycin for MRSA UTI  and bacteremia, which was continued after discharge (750mg  iv q24h).  Now he is admitted with possible hospital acquired pneumonia Chesnie Capell MD wants to continue vancomycin and broaden coverage with imipenem.  Patient received a dose of vancomycin 1g iv x1 in the ED at 1758 today.  Patient has a history CKD.  SCr 1.88 today (CrCl ~28); baseline 1.29.  Wt 73.8 kg  Goal of Therapy:  Vancomycin trough level 15-20 mcg/ml  Plan:  1) Continue vancomycin 750mg  iv q24h, 1st dose at 2000 tomorrow 2) Imipenem 250mg  iv q6h 3) Follow cx, sensitivity, and renal fxn.   4) Check vancomycin trough when it's appropriate.  Adrieana Fennelly, Tsz-Yin 01/27/2012,7:43 PM

## 2012-01-27 NOTE — ED Notes (Signed)
Patient resting at this time, patient states no complaints/no pain.

## 2012-01-28 DIAGNOSIS — R627 Adult failure to thrive: Secondary | ICD-10-CM

## 2012-01-28 DIAGNOSIS — R0609 Other forms of dyspnea: Secondary | ICD-10-CM

## 2012-01-28 DIAGNOSIS — R0600 Dyspnea, unspecified: Secondary | ICD-10-CM

## 2012-01-28 DIAGNOSIS — D62 Acute posthemorrhagic anemia: Secondary | ICD-10-CM

## 2012-01-28 DIAGNOSIS — J984 Other disorders of lung: Secondary | ICD-10-CM

## 2012-01-28 DIAGNOSIS — R0989 Other specified symptoms and signs involving the circulatory and respiratory systems: Secondary | ICD-10-CM

## 2012-01-28 DIAGNOSIS — Z515 Encounter for palliative care: Secondary | ICD-10-CM

## 2012-01-28 LAB — CBC
Hemoglobin: 7.2 g/dL — ABNORMAL LOW (ref 13.0–17.0)
Platelets: 317 10*3/uL (ref 150–400)
RBC: 2.41 MIL/uL — ABNORMAL LOW (ref 4.22–5.81)
WBC: 11.9 10*3/uL — ABNORMAL HIGH (ref 4.0–10.5)

## 2012-01-28 LAB — COMPREHENSIVE METABOLIC PANEL
BUN: 42 mg/dL — ABNORMAL HIGH (ref 6–23)
CO2: 26 mEq/L (ref 19–32)
Calcium: 9.2 mg/dL (ref 8.4–10.5)
Creatinine, Ser: 1.58 mg/dL — ABNORMAL HIGH (ref 0.50–1.35)
GFR calc Af Amer: 44 mL/min — ABNORMAL LOW (ref >90)
GFR calc non Af Amer: 38 mL/min — ABNORMAL LOW (ref >90)
Glucose, Bld: 96 mg/dL (ref 70–99)
Sodium: 137 mEq/L (ref 135–145)
Total Protein: 6.9 g/dL (ref 6.0–8.3)

## 2012-01-28 LAB — IRON AND TIBC
Iron: 13 ug/dL — ABNORMAL LOW (ref 42–135)
TIBC: 161 ug/dL — ABNORMAL LOW (ref 215–435)

## 2012-01-28 LAB — FERRITIN: Ferritin: 203 ng/mL (ref 22–322)

## 2012-01-28 LAB — FOLATE: Folate: 11.5 ng/mL

## 2012-01-28 MED ORDER — ATROPINE SULFATE 1 % OP SOLN
4.0000 [drp] | OPHTHALMIC | Status: DC | PRN
  Filled 2012-01-28: qty 2

## 2012-01-28 MED ORDER — LORAZEPAM 2 MG/ML IJ SOLN: 1.0000 mg | INTRAMUSCULAR | Status: DC | PRN

## 2012-01-28 MED ORDER — BIOTENE DRY MOUTH MT LIQD
15.0000 mL | Freq: Two times a day (BID) | OROMUCOSAL | Status: DC
  Administered 2012-01-28: 15 mL via OROMUCOSAL

## 2012-01-28 MED ORDER — MORPHINE SULFATE 2 MG/ML IJ SOLN
2.0000 mg | INTRAMUSCULAR | Status: DC | PRN
  Administered 2012-01-28 – 2012-01-29 (×2): 2 mg via INTRAVENOUS
  Filled 2012-01-28: qty 1

## 2012-01-28 MED ORDER — MORPHINE SULFATE 2 MG/ML IJ SOLN
1.0000 mg | INTRAMUSCULAR | Status: DC | PRN
  Administered 2012-01-28 (×3): 1 mg via INTRAVENOUS
  Filled 2012-01-28 (×3): qty 1

## 2012-01-28 NOTE — Progress Notes (Signed)
There is an order for 1 unit of PRBC . For H/H =7.4/22.7;Pt.is confused ,so unable to get consent for BT.Tried to call many times daughter [name] ,but she is not answering her phone,left a message on answering machine but still no return call  Received.Called MD on call Maren Reamer. & ordered to keep on trying to call the daughter ,but still unable to get in touch with her.NP Clydie Braun ordered stat cbc.& the result is 7.2/21.7,& was relayed to Clydie Braun & ordered to hold the BT  & she will let the  Doctor in am.Will cont.to monitor pt.

## 2012-01-28 NOTE — Evaluation (Signed)
Physical Therapy Evaluation Patient Details Name: Jack Stevens MRN: [id number] DOB: 1921/07/27 Today's Date: 01/28/2012 Time: 4098-1191 PT Time Calculation (min): 15 min  PT Assessment / Plan / Recommendation Clinical Impression  Patient is an 78 yo male admitted from [facility] with HCAP.  Patient was non-ambulatory pta, requiring max assist with all mobility.  Palliative Care conference - patient going to residential Hospice - comfort care.  At this time, PT is not indicated for patient.  PT will sign off.    PT Assessment  Patent does not need any further PT services    Follow Up Recommendations       Barriers to Discharge        Equipment Recommendations  Defer to next venue    Recommendations for Other Services     Frequency      Precautions / Restrictions Precautions Precautions: Fall Restrictions Weight Bearing Restrictions: No Other Position/Activity Restrictions: pt has sacral wound         Mobility  Bed Mobility Bed Mobility: Not assessed Transfers Transfers: Sit to Stand;Stand to Sit Sit to Stand: 1: +2 Total assist;With upper extremity assist;From chair/3-in-1 Sit to Stand: Patient Percentage: 40% Stand to Sit: 1: +2 Total assist;With upper extremity assist;To chair/3-in-1 Stand to Sit: Patient Percentage: 40% Details for Transfer Assistance: With increased time, patient able to scoot to edge of chair.  Assist to initiate transfer.  Patient leaning posteriorly and to right.  Required total assist to maintain upright position. Ambulation/Gait Ambulation/Gait Assistance: 1: +2 Total assist Ambulation/Gait: Patient Percentage: 60% Ambulation Distance (Feet): 2 Feet Assistive device: Rolling walker Ambulation/Gait Assistance Details: Patient able to take 4 steps backward to chair before sitting.  Required verbal cuing and physical assist to safely use RW to take steps. Gait Pattern: Step-through pattern;Decreased stride length;Shuffle;Trunk flexed       PT Diagnosis:    PT Problem List: Decreased strength;Decreased activity tolerance;Decreased balance;Decreased mobility;Decreased cognition;Decreased safety awareness;Cardiopulmonary status limiting activity PT Treatment Interventions:     PT Goals  N/A  Visit Information  Last PT Received On: 01/28/12 Assistance Needed: +2    Subjective Data  Subjective: "It's the 24th."  Patient used calendar and found date Patient Stated Goal: None stated   Prior Functioning  Home Living Available Help at Discharge: Skilled Nursing Facility Prior Function Level of Independence: Needs assistance Needs Assistance: Bathing;Dressing;Feeding;Grooming;Toileting;Transfers Bath: Total Dressing: Total Feeding: Maximal Grooming: Moderate Toileting: Total Transfer Assistance: Max assist Able to Take Stairs?: No Driving: No Comments: Patient was non-ambulatory at [facility] Communication Communication: No difficulties Dominant Hand: Right    Cognition  Overall Cognitive Status: No family/caregiver present to determine baseline cognitive functioning Area of Impairment: Attention Arousal/Alertness: Awake/alert Orientation Level: Disoriented to;Place;Situation Behavior During Session: [facility] for tasks performed Current Attention Level: Sustained Following Commands: Follows one step commands inconsistently;Follows one step commands with increased time    Extremity/Trunk Assessment Right Upper Extremity Assessment RUE ROM/Strength/Tone: [facility] for tasks assessed Left Upper Extremity Assessment LUE ROM/Strength/Tone: WFL for tasks assessed Right Lower Extremity Assessment RLE ROM/Strength/Tone: Deficits RLE ROM/Strength/Tone Deficits: Strength grossly 4-/5.  Able to stand. Left Lower Extremity Assessment LLE ROM/Strength/Tone: Deficits LLE ROM/Strength/Tone Deficits: Strength grossly 4-/5.  Able to stand Trunk Assessment Trunk Assessment: Kyphotic   Balance Balance Balance Assessed:  Yes Static Sitting Balance Static Sitting - Balance Support: Bilateral upper extremity supported;Feet supported Static Sitting - Level of Assistance: 4: Min assist (tendency to lean to R, may be trying to ease pressure on sac)  End of Session  PT - End of Session Equipment Utilized During Treatment: Gait belt Activity Tolerance: Patient limited by fatigue Patient left: in chair;with call bell/phone within reach Nurse Communication: Mobility status  GP     Vena Austria 01/28/2012, 4:22 PM Durenda Hurt. Renaldo Fiddler, [facility] Pager [phone]

## 2012-01-28 NOTE — Evaluation (Signed)
Occupational Therapy Evaluation Patient Details Name: Jack Stevens MRN: [id number] DOB: [birth year] Today's Date: 01/28/2012 Time: 4132-4401 OT Time Calculation (min): 26 min  OT Assessment / Plan / Recommendation Clinical Impression  Pt admitted with PNA from SNF.  Pt was dependent in mobility and ADL at baseline and receiving therapy at SNF.  No acute OT needs.    OT Assessment  Patient does not need any further OT services    Follow Up Recommendations  Skilled nursing facility    Barriers to Discharge      Equipment Recommendations  Defer to next venue    Recommendations for Other Services    Frequency       Precautions / Restrictions Precautions Precautions: Fall Restrictions Weight Bearing Restrictions: No Other Position/Activity Restrictions: pt has sacral wound   Pertinent Vitals/Pain     ADL  Eating/Feeding: Performed;Moderate assistance Where Assessed - Eating/Feeding: Chair Grooming: Performed;Wash/dry face;Moderate assistance Where Assessed - Grooming: Supported sitting Upper Body Bathing: Simulated;+1 Total assistance Where Assessed - Upper Body Bathing: Supported sitting Lower Body Bathing: Simulated;+1 Total assistance Where Assessed - Lower Body Bathing: Lean right and/or left;Supported sitting Upper Body Dressing: Moderate assistance;Performed Where Assessed - Upper Body Dressing: Supported sitting Lower Body Dressing: Simulated;+1 Total assistance Where Assessed - Lower Body Dressing: Lean right and/or left;Supported sitting Equipment Used: Gait belt Transfers/Ambulation Related to ADLs: Attempted sit to stand, unable in absence of second person. ADL Comments: Pt dependent in all aspects of ADL at SNF.  Minimally able to participate in activity at SNF due to sacral pain per PT at [facility] place.    OT Diagnosis:    OT Problem List:   OT Treatment Interventions:     OT Goals    Visit Information  Last OT Received On: 01/28/12 Assistance  Needed: +2    Subjective Data  Subjective: "[name]," Patient Stated Goal: Pt agreeable to return to [facility].   Prior Functioning  Vision/Perception  Home Living Available Help at Discharge: Skilled Nursing Facility Prior Function Level of Independence: Needs assistance Needs Assistance: Bathing;Dressing;Feeding;Grooming;Toileting;Transfers Bath: Total Dressing: Total Feeding: Maximal Grooming: Moderate Toileting: Total Communication Communication: No difficulties Dominant Hand: Right      Cognition  Overall Cognitive Status: No family/caregiver present to determine baseline cognitive functioning (hx of dementia) Area of Impairment: Attention Arousal/Alertness: Awake/alert Orientation Level: Disoriented to;Place;Situation (used calendar for time) Behavior During Session: [facility] for tasks performed Current Attention Level: Sustained Following Commands: Follows one step commands inconsistently    Extremity/Trunk Assessment Right Upper Extremity Assessment RUE ROM/Strength/Tone: WFL for tasks assessed Left Upper Extremity Assessment LUE ROM/Strength/Tone: WFL for tasks assessed   Mobility Bed Mobility Bed Mobility: Not assessed   Exercise    Balance Balance Balance Assessed: Yes Static Sitting Balance Static Sitting - Balance Support: Bilateral upper extremity supported;Feet supported Static Sitting - Level of Assistance: 4: Min assist (tendency to lean to R, may be trying to ease pressure on sac)  End of Session OT - End of Session Activity Tolerance: Patient limited by fatigue Patient left: in chair;with call bell/phone within reach  GO     Evern Bio 01/28/2012, 2:06 PM [phone]

## 2012-01-28 NOTE — Progress Notes (Signed)
PATIENT OOB TO CHAIR

## 2012-01-28 NOTE — Clinical Social Work Psychosocial (Signed)
     Clinical Social Work Department BRIEF PSYCHOSOCIAL ASSESSMENT 01/28/2012  Patient:  Jack Stevens, Jack Stevens     Account Number:  [id number]     Admit date:  01/27/2012  Clinical Social Worker:  Jacelyn Grip  Date/Time:  01/28/2012 09:50 AM  Referred by:  Physician  Date Referred:  01/28/2012 Referred for  Residential hospice placement   Other Referral:   Interview type:  Family Other interview type:    PSYCHOSOCIAL DATA Living Status:  FACILITY Admitted from facility:  [facility] Level of care:  Skilled Nursing Facility Primary support name:  [name] Condrey/daughter/[phone] Primary support relationship to patient:  CHILD, ADULT Degree of support available:   adequate    CURRENT CONCERNS Current Concerns  Post-Acute Placement   Other Concerns:    SOCIAL WORK ASSESSMENT / PLAN CSW received noticiation from RN that pt admitted from [facility]. CSW discussed with MD who stated that PMT GOC had been ordered for pt. CSW spoke with PMT NP, Lorinda Creed who completed GOC via telephone with pt daughter, [name] and recommendation is for residential hospice and pt family interested in [facility].    CSW contacted pt daughter via telephone. CSW explained role and discussed recommendation for residential hospice placement. CSW discussed options of residential hospice homes in the area and pt daughter confirmed interest in [facility]. Pt daughter discussed that pt has a large family in the [facility] area that are a large support system for patient. CSW contacted [facility] and made referral for residential hospice placement by faxing pt clinical information to facility. CSW to follow up with [facility] in regard to bed availability once facility is able to receive referral and review pt clinical information. CSW to follow up with pt daughter and remain available to provide support. CSW to  facilitate pt discharge needs when pt medically stable and hospice home bed available.   Assessment/plan status:  Psychosocial Support/Ongoing Assessment of Needs Other assessment/ plan:   discharge planning   Information/referral to community resources:   Referral to [facility]    PATIENTS/FAMILYS RESPONSE TO PLAN OF CARE: Per chart, pt oriented to person only. Pt daughter lives in [place], but easily reached by telephone and appears supportive. Pt daughter hopeful for pt to go to [facility] [place] in order to be close to family in that area.

## 2012-01-28 NOTE — Progress Notes (Signed)
Speech Pathology Cancellation Note  Evaluation cancelled today due to decision for comfort care, home with hospice.  Dr. David Stall cancelled evaluation order.Marland Kitchen  Myra Rude, M.S.,CCC-SLP Pager [phone] 01/28/2012, 3:15 PM

## 2012-01-28 NOTE — Progress Notes (Signed)
Admitted pt.to 5522 ,from ED. Due to Anemia & Hypotension.His hgb./hct.=7.4/22.7.Pt was just admitted from [place] long for sepsis sec.to MRSA UTI  & METHYLLINE RESISTANT STAPH .AREUS BACTEREMIA.He came from [place] [facility] this time.He is oriented to name only.He has a foley cath.in place.Placed pt.on TEL.Oriented pt.to room & call bell.He has stage 1 on sacrum.placed Mepilex. & turned pt.from side to side.Will cont.to monitor pt.

## 2012-01-28 NOTE — Progress Notes (Signed)
Patient c/o feeling SOB. Patient given 1mg  of MSO4 and O2 adjusted to 4 L. )@ sats 96 % will continue to monitor

## 2012-01-28 NOTE — Progress Notes (Signed)
[facility] PROGRESS NOTE  Jack Stevens YQM:[id number] DOB: Dec 05, 1921 DOA: 01/27/2012   Assessment/Plan: Patient Active Hospital Problem List: HCAP (healthcare-associated pneumonia) (01/27/2012) -patient and they 1 of antibiotics. Vancomycin and Primaxin.  -This is the second time he's been to the hospital this month. It was discussed with the family about goals of care. So hospice and palliative care was consulted they discussed with the family over the phone and they decided to make patient comfort care. We will try to get him to the hospice. We'll DC his infusions of packed red blood cells.  -Patient will go to hospice home with PICC line.   Hypoxia (01/27/2012)  -currently stable we'll continue him on 2 L of O2. -Will go home to hospice on the oxygen.   Obstructive uropathy (01/27/2012) -leave Foley in place  CKD (chronic kidney disease) (01/27/2012) -with baseline creatinine of 1.39 on 01/19/12, creatinine trending down, discussed with the family no further labs appear  Oral thrush (01/27/2012)   -continue Diflucan for a total of 7 days.   Normocytic Anemia:  This is chronic, and Hemoglobin was 9.2 on 01/16/12, per EMR. Today, hemoglobin is 7.4. Patient has had no overt GI bleed, but is stool guaiac positive, in the ED. I have discussed with the family they would like no further blood transfusions. No further workup.  Code Status: DNR/DNI Family Communication: Daughter [phone] Disposition Plan: Hospice when bed available     LOS: 1 day       Subjective: No further complaints, the patient is lying in bed comfortable, pleasantly confused.  Objective: Filed Vitals:   01/27/12 2040 01/27/12 2203 01/28/12 0422 01/28/12 0900  BP: 122/78  105/61   Pulse: 77  70 72  Temp: 98.9 F (37.2 C)  98.6 F (37 C)   TempSrc: Oral  Oral   Resp: 20  20 22   Height:      Weight:      SpO2: 95% 95% 97% 96%    Intake/Output Summary (Last 24 hours) at 01/28/12 1057 Last  data filed at 01/28/12 0833  Gross per 24 hour  Intake    440 ml  Output   1075 ml  Net   -635 ml   Weight change:   Exam:  General: Alert, awake, oriented x1, in no acute distress.  HEENT: No bruits, no goiter.  Heart: Regular rate and rhythm, without murmurs, rubs, gallops.  Lungs: Good air movement, bilateral air movement.  Abdomen: Soft, nontender, nondistended, positive bowel sounds.  Neuro: Grossly intact, nonfocal.   Data Reviewed: Basic Metabolic Panel:  Lab 01/28/12 1027 01/27/12 1620  NA 137 135  K 4.0 4.0  CL 102 100  CO2 26 28  GLUCOSE 96 95  BUN 42* 49*  CREATININE 1.58* 1.88*  CALCIUM 9.2 9.5  MG -- --  PHOS -- --   Liver Function Tests:  Lab 01/28/12 0300  AST 14  ALT 10  ALKPHOS 120*  BILITOT 0.2*  PROT 6.9  ALBUMIN 1.8*   No results found for this basename: LIPASE:5,AMYLASE:5 in the last 168 hours No results found for this basename: AMMONIA:5 in the last 168 hours CBC:  Lab 01/28/12 0257 01/27/12 1620  WBC 11.9* 13.5*  NEUTROABS -- 12.1*  HGB 7.2* 7.4*  HCT 21.7* 22.7*  MCV 90.0 91.5  PLT 317 373   Cardiac Enzymes: No results found for this basename: CKTOTAL:5,CKMB:5,CKMBINDEX:5,TROPONINI:5 in the last 168 hours BNP: No components found with this basename: POCBNP:5 CBG: No results found for this basename:  GLUCAP:5 in the last 168 hours  Recent Results (from the past 240 hour(s))  MRSA PCR SCREENING     Status: Normal   Collection Time   01/27/12  7:36 PM      Component Value Range Status Comment   MRSA by PCR NEGATIVE  NEGATIVE Final      Studies: Ct Abdomen Pelvis Wo Contrast  01/14/2012  *RADIOLOGY REPORT*  Clinical Data: Doses question bladder mass or diverticula on ultrasound  CT ABDOMEN AND PELVIS WITHOUT CONTRAST  Technique:  Multidetector CT imaging of the abdomen and pelvis was performed following the standard protocol without intravenous contrast. Oral contrast not administered.  Sagittal and coronal MPR images  reconstructed from axial data set.  Comparison: Renal ultrasound 01/12/2012  Findings: Bibasilar pleural effusions and atelectasis. Mildly complicated cyst with wall calcification lateral aspect upper pole left kidney 4.1 x 4.4 x 4.0 cm. 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 No right hydronephrosis but mild right ureteral dilatation present. Marked enlargement of prostate gland, approximately 8.0 x 6.5 x 6.3 cm. Bilateral bladder diverticula are identified anterior and superior to the ureteral orifices, containing dependent calculi. Additional diverticulum at dome of bladder containing air. Foley catheter present. Diffuse bladder wall thickening identified, unable to exclude tumor.  Calcified granulomata in liver and spleen. Distended gallbladder. Scattered respiratory motion artifacts. No definite additional focal abnormalities of liver, spleen, pancreas, or adrenal glands. Moderate sized hiatal hernia. Extensive atherosclerotic calcifications. Normal appendix. Colonic diverticulosis. No mass, adenopathy, free fluid, or inflammatory process. No acute osseous findings.  IMPRESSION: Marked prostatic enlargement. Diffuse bladder wall thickening, can be seen with inflammatory and neoplastic processes; correlation with cystoscopy recommended. Multiple bladder diverticula some which contain dependent calculi. 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 Mild right ureteral dilatation without hydronephrosis. Sigmoid diverticulosis. Hiatal hernia. Bibasilar pleural effusions and atelectasis. Extensive atherosclerotic disease.  Original Report Authenticated By: Lollie Marrow, M.D.   Dg Chest 2 View  01/27/2012  *RADIOLOGY REPORT*  Clinical Data: Shortness of breath, hypertension  CHEST - 2 VIEW  Comparison: 01/12/2012, 01/11/2012  Findings: Right PICC line tip in the SVC RA junction.  Very low lung volumes noted.  Cardiomegaly  evident with vascular congestion and increased streaky basilar atelectasis/airspace disease.  Lower lobe pneumonia not excluded.  No large effusion or pneumothorax. No definite CHF.  Small effusions suspected on the lateral view.  IMPRESSION: Perihilar and lower lobe streaky atelectasis and/or airspace disease.  Basilar pneumonia not excluded.  Cardiomegaly without definite superimposed CHF  Small effusions  Original Report Authenticated By: Judie Petit. Ruel Favors, M.D.   Mr Thoracic Spine Wo Contrast  01/16/2012  *RADIOLOGY REPORT*  Clinical Data: Back pain.  Bacteremia.  MRI THORACIC SPINE WITHOUT CONTRAST  Technique:  Multiplanar and multiecho pulse sequences of the thoracic spine were obtained without intravenous contrast.  Comparison: None.  Findings: There is no evidence of fracture in the thoracic region.  T1-2 and T2-3 are unremarkable.  The T3-4 disc shows anterior osteophyte formation.  No canal compromise.  In the region from T4- T7, the vertebral bodies appear fused.  The T7-8 disc shows degenerative desiccation but no bulge or herniation.  In the region from T8-T10, the vertebral bodies appear fused.  The T10-11 disc shows some Schmorl's nodes and some slightly increased water signal, but there are are no destructive changes or edematous changes in the adjacent vertebral bodies.  Therefore, this is felt unlikely to relate to  infection.  The T11-12 disc shows mild degenerative change with lower level T2 signal.  The T12-L1 disc is unremarkable.  The spinal canal is widely patent.  Ample subarachnoid space surrounds the cord.  No cord pathology.  No facet arthropathy. Paravertebral soft tissues show bilateral pleural effusions with dependent pulmonary atelectasis.  There is either some loculated fluid or a cyst in the lower right pleural space.  IMPRESSION: No evidence of acute spinal pathology.  Segments of fusion, including from T4-T7 and T8-T10.  Mild degenerative changes at the disc levels that are not  fused.  There is some T2 signal within the T10-11 and T11-12 discs, but the endplates do not appear irregular and there is no vertebral body edema.  Therefore, I think it is unlikely that there is diskitis at those levels.  Conceivably, early diskitis can look this way but I think that is unlikely.  Bilateral pleural effusions with pulmonary atelectasis.  Possible cyst or loculation in the right lower chest.  Original Report Authenticated By: Thomasenia Sales, M.D.   US Renal  01/12/2012  *RADIOLOGY REPORT*  Clinical Data: 78 year old male with hematuria, UTI, acute renal failure.  RENAL/URINARY TRACT ULTRASOUND COMPLETE  Comparison:  None.  Findings:  Right Kidney:  Hydronephrosis.  Evidence of echogenic debris in the right renal pelvis and proximal ureter.  Hydroureter is evident. Renal length 11.5 cm.  No focal right renal lesion is identified.  Left Kidney:  Hydronephrosis, moderate to severe in greater than that on the right.  No debris evident in the left renal collecting system.  Hydroureter is evident.  There is a superimposed simple appearing exophytic renal cyst measuring up to 48 mm diameter. Renal length is 12.9 cm.  Bladder:  At least partially decompressed, and a Foley catheter balloon is partially visible.  However, to complex appearing cystic structures are noted adjacent to the bladder.  These might be bladder diverticula containing debris, uncertain.  The bladder also has a heterogeneous appearance.  Furthermore there is a linear echogenic structure partially visible which might represent a stent, uncertain.  IMPRESSION: Left greater than right bilateral obstructive uropathy.  The obstructing etiology is not identified, but the bladder is very heterogeneous such that an obstructing bladder mass is not excluded. There is evidence of echogenic debris in the obstructed right side system, which may indicate pyonephrosis.  Additionally, there may be bladder diverticula containing debris. Overall,  recommend follow-up CT abdomen and pelvis (can be done without contrast if necessary) to better characterize.  Original Report Authenticated By: Harley Hallmark, M.D.   Dg Chest Port 1 View  01/12/2012  *RADIOLOGY REPORT*  Clinical Data: T kidney.  PORTABLE CHEST - 1 VIEW  Comparison: 01/11/2012.  Findings: Mild pulmonary vascular congestion.  Slight increased markings lung bases may represent atelectasis.  Subtle infiltrate not entirely excluded.  Hiatal hernia.  Heart size top normal. Minimally tortuous aorta.  No pneumothorax.  IMPRESSION: Pulmonary vascular congestion.  Slight increased markings lung bases as noted above.  Hiatal hernia.  Original Report Authenticated By: Fuller Canada, M.D.   Dg Chest Port 1 View  01/11/2012  *RADIOLOGY REPORT*  Clinical Data: Weakness.  Shortness of breath.  PORTABLE CHEST - 1 VIEW  Comparison: None.  Findings: Enlarged cardiac silhouette.  Moderately large hiatal hernia.  Clear lungs with normal vascularity.  Bilateral acromioclavicular joint degenerative changes.  IMPRESSION:  1.  Cardiomegaly. 2.  Moderately large hiatal hernia.  Original Report Authenticated By: Darrol Angel, M.D.  Scheduled Meds:   . sodium chloride   Intravenous Once  . sodium chloride   Intravenous STAT  . ipratropium  0.5 mg Nebulization TID   And  . albuterol  2.5 mg Nebulization TID  . antiseptic oral rinse  15 mL Mouth Rinse BID  . diltiazem  120 mg Oral Daily  . fluconazole  100 mg Oral Daily  . furosemide  20 mg Intravenous Once  . metoprolol tartrate  25 mg Oral TID  . miconazole   Topical BID  . nystatin   Topical BID  . pantoprazole  40 mg Oral q morning - 10a  . piperacillin-tazobactam (ZOSYN)  IV  3.375 g Intravenous Once  . sertraline  150 mg Oral Daily  . sodium chloride  3 mL Intravenous Q12H  . Tamsulosin HCl  0.4 mg Oral Daily  . triamcinolone cream  1 application Topical BID  . vancomycin  1,000 mg Intravenous Once  . DISCONTD: aspirin  81 mg Oral Daily    . DISCONTD: Certa Plus  1 tablet Oral Daily  . DISCONTD: dutasteride  0.5 mg Oral Daily  . DISCONTD: ferrous sulfate  325 mg Oral Q breakfast  . DISCONTD: guaiFENesin  600 mg Oral BID  . DISCONTD: imipenem-cilastatin  250 mg Intravenous Q6H  . DISCONTD: miconazole  1 application Topical BID  . DISCONTD: multivitamin with minerals  1 tablet Oral Daily  . DISCONTD: nystatin   Topical BID  . DISCONTD: polyethylene glycol  17 g Oral Daily  . DISCONTD: vancomycin  750 mg Intravenous Q24H   Continuous Infusions:   Lambert Keto, MD  [facility] Pager [phone]  If 7PM-7AM, please contact night-coverage [email or web address] Password [email or web address] 01/28/2012, 10:57 AM

## 2012-01-28 NOTE — Progress Notes (Signed)
Spoke to [employer] from Infection prevention. Patient on Contact precaution, and no need for CHG bactroban.

## 2012-01-28 NOTE — ED Provider Notes (Signed)
Medical screening examination/treatment/procedure(s) were conducted as a shared visit with non-physician practitioner(s) and myself.  I personally evaluated the patient during the encounter On my exam this elderly male who cannot provide salient details of the history of present illness was in no distress.  He was hypoxic on room air, though he improved quickly with supplemental oxygen.  Given the patient's description of a recent hospitalization, now with minimal activity, decreased cognition and new URI like symptoms, there is a suspicion for an aspiration event.  The patient's evaluation was consistent with ammonia.  The patient was treated with Vanc, Zosyn, admitted for further evaluation and management. I saw the ecg, and agree with the interpretation. I also reviewed the patient's CXR. On my exam he was sat'ing 95%on [place], abnormal HR 80, sr, normal  Gerhard Munch, MD 01/28/12 0031

## 2012-01-28 NOTE — Consult Note (Signed)
Patient Jack Stevens      DOB: Dec 09, 1921      AVW:[id number]     Consult Note from the Palliative Medicine Team at [facility]    Consult Requested by: Dr Robb Matar     PCP: No primary provider on file. Reason for Consultation:Clarification of GOC and options     Phone Number:None   This NP Jack Stevens reviewed medical records, received report from team, assessed the patient and then meet at the patient's and spoke to daughter by phone  Jack Stevens # [phone] [facility]) to discuss diagnosis prognosis, GOC, EOL wishes disposition and options.  A detailed discussion was had today regarding advanced directives.  Concepts specific to code status, artifical feeding and hydration, continued IV antibiotics and rehospitalization was had.  The difference between a aggressive medical intervention path  and a palliative comfort care path for this patient at this time was had.  Values and goals of care important to patient and family were attempted to be elicited.  Concept of Hospice and Palliative Care were discussed  Natural trajectory and expectations at EOL were discussed.  Questions and concerns addressed.  Hard Choices booklet left for review. Family encouraged to call with questions or concerns.  PMT will continue to support holistically.   Assessment of patients Current state: Overall failure to thrive.  Multiple hospitalizations over the past few months, unsuccessful rehabilitation.   Sepsis, likely secondary to UTI.  Protein malnutrition.  Focus of care is now comfort    Goals of Care: 1.  Code Status:DNR/DNI   2. Scope of Treatment: 1. Vital Signs: daily 2. Respiratory/Oxygen: for comfort only 3. Nutritional Support/Tube Feeds:no artificial  Feeding or hydration now or in the future        -comfort feeds and fluids of choice as tolerated  4. Antibiotics:none 5. Blood Products: none 6. MVH:QION         -maintain PICC line for acces for symptom management  7. Review of  Medications to be discontinued: minimize, for comfort only 8. Labs:none 9. Telemetry:none   4. Disposition:  Hopeful for residential hospice.  Prognosis is poor.  Likely  A few weeks or less.  Family is interested in [place]; large family has lived in the area for years and patient has several elderly siblings who wish to visit him at end of life.   3. Symptom Management:   1. Anxiety/Agitation: Ativan 1mg  every 4 hours prn 2. Pain/ Dyspnea:Morphine 1 mg every 4 hour prn  3. Bowel Regimen: Dulcolax supp prn 4. Fever: Tylenol supp   5. Terminal Secretions: Atropine gtts prn  4. Psychosocial:  Emotional support offered to daughter.  Questions and concerns addressed.  Sat quietly at bedside with the patient  5. Spiritual:  Chaplain consulted    Brief HPI: 78 year old white male in overalll failure to thrive situation,  Multiple rehospitalization's, poor po intake, hypoalbuminemia (albumin 1.8), leukocytosis (WBC 11.9), rapidly declining  functional and mental status.  CKD, sepsis, depression, PTSD.  Focus of care is now comfort.  Family is comfortable with the decision knowing the patients own wishes   ROS: poor historian, intermittently confused.  C/O back pain presently      PMH:  Past Medical History  Diagnosis Date  . Renal disorder   . Chronic renal failure   . BPH (benign prostatic hyperplasia)   . Seizure   . Hypertension   . Depression   . MGUS (monoclonal gammopathy of unknown significance)   . Anxiety   .  PTSD (post-traumatic stress disorder)   . Diverticulitis   . Anemia   . Pneumonia   . Urinary tract infection   . Bacteremia   . Chronic indwelling foley catheter      PSH: Past Surgical History  Procedure Date  . Tonsillectomy    I have reviewed the FH and SH and  If appropriate update it with new information. No Known Allergies Scheduled Meds:   . sodium chloride   Intravenous Once  . sodium chloride   Intravenous STAT  . ipratropium  0.5 mg  Nebulization TID   And  . albuterol  2.5 mg Nebulization TID  . antiseptic oral rinse  15 mL Mouth Rinse BID  . diltiazem  120 mg Oral Daily  . fluconazole  100 mg Oral Daily  . furosemide  20 mg Intravenous Once  . metoprolol tartrate  25 mg Oral TID  . miconazole   Topical BID  . nystatin   Topical BID  . pantoprazole  40 mg Oral q morning - 10a  . piperacillin-tazobactam (ZOSYN)  IV  3.375 g Intravenous Once  . sertraline  150 mg Oral Daily  . sodium chloride  3 mL Intravenous Q12H  . Tamsulosin HCl  0.4 mg Oral Daily  . triamcinolone cream  1 application Topical BID  . vancomycin  1,000 mg Intravenous Once  . DISCONTD: aspirin  81 mg Oral Daily  . DISCONTD: Certa Plus  1 tablet Oral Daily  . DISCONTD: dutasteride  0.5 mg Oral Daily  . DISCONTD: ferrous sulfate  325 mg Oral Q breakfast  . DISCONTD: guaiFENesin  600 mg Oral BID  . DISCONTD: imipenem-cilastatin  250 mg Intravenous Q6H  . DISCONTD: miconazole  1 application Topical BID  . DISCONTD: multivitamin with minerals  1 tablet Oral Daily  . DISCONTD: nystatin   Topical BID  . DISCONTD: polyethylene glycol  17 g Oral Daily  . DISCONTD: vancomycin  750 mg Intravenous Q24H   Continuous Infusions:  PRN Meds:.sodium chloride, acetaminophen, acetaminophen, albuterol, ALPRAZolam, bisacodyl, hydrocerin, ipratropium, morphine injection, ondansetron (ZOFRAN) IV, ondansetron, sodium chloride, DISCONTD:  morphine injection, DISCONTD: oxyCODONE    BP 105/61  Pulse 72  Temp 98.6 F (37 C) (Oral)  Resp 22  Ht 5\' 10"  (1.778 m)  Wt 73 kg (160 lb 15 oz)  BMI 23.09 kg/m2  SpO2 96%   PPS: 30 % at best   Intake/Output Summary (Last 24 hours) at 01/28/12 1049 Last data filed at 01/28/12 0833  Gross per 24 hour  Intake    440 ml  Output   1075 ml  Net   -635 ml   LBM:01-28-12                      Stool Softner: docolax   Physical Exam:  General:   ill appearing, NAD HEENT:  moist membranes, + temporal muscle wasting Chest:    Diminished, coarse BS Heart: RRR Abdomen: soft NT +BS Ext: without edema Neuro:intermittantly confused Skin: BLE with scattered bruising noted   Labs: CBC    Component Value Date/Time   WBC 11.9* 01/28/2012 0257   RBC 2.41* 01/28/2012 0257   HGB 7.2* 01/28/2012 0257   HCT 21.7* 01/28/2012 0257   PLT 317 01/28/2012 0257   MCV 90.0 01/28/2012 0257   MCH 29.9 01/28/2012 0257   MCHC 33.2 01/28/2012 0257   RDW 14.7 01/28/2012 0257   LYMPHSABS 0.8 01/27/2012 1620   MONOABS 0.4 01/27/2012 1620   EOSABS  0.1 01/27/2012 1620   BASOSABS 0.1 01/27/2012 1620    BMET    Component Value Date/Time   NA 137 01/28/2012 0300   K 4.0 01/28/2012 0300   CL 102 01/28/2012 0300   CO2 26 01/28/2012 0300   GLUCOSE 96 01/28/2012 0300   BUN 42* 01/28/2012 0300   CREATININE 1.58* 01/28/2012 0300   CALCIUM 9.2 01/28/2012 0300   GFRNONAA 38* 01/28/2012 0300   GFRAA 44* 01/28/2012 0300    CMP     Component Value Date/Time   NA 137 01/28/2012 0300   K 4.0 01/28/2012 0300   CL 102 01/28/2012 0300   CO2 26 01/28/2012 0300   GLUCOSE 96 01/28/2012 0300   BUN 42* 01/28/2012 0300   CREATININE 1.58* 01/28/2012 0300   CALCIUM 9.2 01/28/2012 0300   PROT 6.9 01/28/2012 0300   ALBUMIN 1.8* 01/28/2012 0300   AST 14 01/28/2012 0300   ALT 10 01/28/2012 0300   ALKPHOS 120* 01/28/2012 0300   BILITOT 0.2* 01/28/2012 0300   GFRNONAA 38* 01/28/2012 0300   GFRAA 44* 01/28/2012 0300    Jack Creed NP  [phone]  Dr Radonna Ricker aware of above.

## 2012-01-29 DIAGNOSIS — R7881 Bacteremia: Secondary | ICD-10-CM

## 2012-01-29 DIAGNOSIS — A4901 Methicillin susceptible Staphylococcus aureus infection, unspecified site: Secondary | ICD-10-CM

## 2012-01-29 LAB — TYPE AND SCREEN: ABO/RH(D): A POS

## 2012-01-29 MED ORDER — LORAZEPAM 2 MG/ML IJ SOLN: 1.0000 mg | INTRAMUSCULAR | Status: DC | PRN

## 2012-01-29 MED ORDER — MORPHINE SULFATE 2 MG/ML IJ SOLN: 2.0000 mg | INTRAMUSCULAR | Status: DC | PRN

## 2012-01-29 MED ORDER — FLUCONAZOLE 100 MG PO TABS: 100.0000 mg | ORAL_TABLET | Freq: Every day | ORAL | Status: AC

## 2012-01-29 NOTE — Progress Notes (Signed)
Clinical Social Worker received voice mail message from [facility] [place] stating that pt accepted for hospice home and bed available today. Clinical Child psychotherapist contacted [facility] Clinical liaison, Charlynne Pander who stated pt discharge information can be sent with pt at transfer and hospice home planning for pt for their first admission at 11:30am. Clinical Social Worker discussed with MD and RN. Clinical Social Worker contacted pt daughter, [name] to update and pt daughter stated that pt brother, Kaio Kuhlman would plan to meet pt at [facility] [place] at 11:30 am. Clinical Social Worker provided phone number for RN to call report to Hospice Home.  Clinical Social Worker facilitated pt discharge needs including compiling pt discharge information for transfer, discussing with pt daughter, [name], via telephone, and arranging ambulance transportation for pt to [facility] [place]. Clinical Social Worker notified [facility] Clinical Liaison of transportation arranged for pt. No further social work needs identified at this time. Clinical Social Worker signing off.  Jacklynn Lewis, MSW, LCSWA  Clinical Social Work [phone]

## 2012-01-29 NOTE — Discharge Summary (Signed)
Physician Discharge Summary  [place] QMV:[id number] DOB: 26-Jul-1921 DOA: 01/27/2012  PCP: No primary provider on file.  Admit date: 01/27/2012 Discharge date: 01/29/2012  Discharge Diagnoses:  Principal Problem:  *Comfort care Active Problems:  HCAP (healthcare-associated pneumonia)  Hypoxia  Obstructive uropathy  Depression with anxiety  PTSD (post-traumatic stress disorder)  CKD (chronic kidney disease)  Oral thrush  Dyspnea  Failure to thrive in adult   Discharge Condition: Stable  Disposition:  followup with hospice M.D.  Diet: Comfort feeds  History of present illness:  78 year old male with known history of chronic kidney disease stage III (baseline creatinine of 1.29 on 01/06/2012), BPH with chronic indwelling Foley catheter, MGUS, hypertension, seizure disorder, depression, anxiety, PTSD and chronic anemia. He is s/p hospitalization at [facility] 01/11/12-01/19/12, for sepsis secondary to MRSA UTI and Methicillin Resistant Staph Aureus bacteremia, treated with iv Vancomycin, continued on discharge, via PICC. During that hospitalization, he had gross hematuria, and renal ultrasound demonstrated bilateral hydronephrosis with a very heterogeneous bladder concerning for an obstructive bladder mass. He was to follow up with Dr Lindaann Slough, urologist, for cystoscopy, following discharge. Patient is a poor historian, and history is therefore gleaned from ED PA, and supplemented from EMR and discussion with patient's daughter, via phone.   Hospital Course:   Comfort care: -Due to the patient's multiple comorbidities, deconditioning and ongoing aspiration. Hospice and palliative care was consulted. They discussed with the family extensively the future management of the patient. And the decided to move towards comfort care. They expressed their concerns to the hospice care team and me. In details about the scope of treatment were discussed with the. They do not want  any artificial support further procedures. Indeed he wanted no restrictions in diet.  -Hopeful for residential hospice. Prognosis is poor. Likely A few weeks or less. Family is interested in [place]; large family has lived in the area for years and patient has several elderly siblings who wish to visit him at end of life.   HCAP (healthcare-associated pneumonia): -After 2 days of IV antibiotics and the patient with ongoing difficulty breathing. He was started on low doses of morphine to help with the edge. It was discussed with the family the very poor prognoses. History and ongoing aspiration. They decided to move to comfort care.  Hypoxia: -Hospice home with 2 l of oxygen. Secondary to PNA.   Depression with anxiety: -Continue Zoloft, no change.   CKD (chronic kidney disease) -stable, at baseline.  Oral thrush -Incidental finding. Started on diflucan, will continue for 10 days.  Discharge Exam: Filed Vitals:   01/29/12 0539  BP: 118/69  Pulse: 74  Temp: 98.4 F (36.9 C)  Resp: 16   Filed Vitals:   01/28/12 1841 01/28/12 2004 01/28/12 2044 01/29/12 0539  BP: 123/65  107/54 118/69  Pulse: 65  78 74  Temp: 98.2 F (36.8 C)  98.1 F (36.7 C) 98.4 F (36.9 C)  TempSrc: Oral  Oral Oral  Resp: 20  16 16   Height:      Weight:      SpO2: 100% 96% 97% 96%   General: alert and oriented x 2 Cardiovascular: RRR with a +S1 and S2. Respiratory: good air movement CTA B/L.  Discharge Instructions  Discharge Orders    Future Orders Please Complete By Expires   Diet - low sodium heart healthy      Increase activity slowly        Medication List  As of 01/29/2012  9:00 AM   STOP taking these medications         aspirin 81 MG chewable tablet      bisacodyl 5 MG EC tablet      Certa Plus Tabs      ferrous sulfate 325 (65 FE) MG tablet      hydrocerin Crea      miconazole 2 % cream      nystatin powder      oxycodone 5 MG capsule      pantoprazole 40 MG tablet        polyethylene glycol packet      triamcinolone cream 0.1 %      VANCOCIN HCL IV         TAKE these medications         acetaminophen 325 MG tablet   Commonly known as: TYLENOL   Take by mouth every 6 (six) hours as needed. 1 to 2 tablets every 4 hours as needed for pain or temperature exceeding 100.18F. Not to exceed 4gm APAP/24 hrs.      ALPRAZolam 0.5 MG tablet   Commonly known as: XANAX   Take 1 tablet (0.5 mg total) by mouth 3 (three) times daily as needed for anxiety. Anxiety.      diltiazem 120 MG 24 hr capsule   Commonly known as: CARDIZEM CD   Take 1 capsule (120 mg total) by mouth daily.      dutasteride 0.5 MG capsule   Commonly known as: AVODART   Take 0.5 mg by mouth daily.      fluconazole 100 MG tablet   Commonly known as: DIFLUCAN   Take 1 tablet (100 mg total) by mouth daily.      LORazepam 2 MG/ML injection   Commonly known as: ATIVAN   Inject 0.5 mLs (1 mg total) into the vein every 4 (four) hours as needed for anxiety.      metoprolol tartrate 25 MG tablet   Commonly known as: LOPRESSOR   Take 1 tablet (25 mg total) by mouth 3 (three) times daily.      morphine 2 MG/ML injection   Inject 1 mL (2 mg total) into the vein every hour as needed (dyspnea).      ondansetron 4 MG disintegrating tablet   Commonly known as: ZOFRAN-ODT   Take 4 mg by mouth every 6 (six) hours as needed. Nausea/vomiting.      sertraline 100 MG tablet   Commonly known as: ZOLOFT   Take 150 mg by mouth daily.      Tamsulosin HCl 0.4 MG Caps   Commonly known as: FLOMAX   Take 0.4 mg by mouth daily. After a meal.              The results of significant diagnostics from this hospitalization (including imaging, microbiology, ancillary and laboratory) are listed below for reference.    Significant Diagnostic Studies: Ct Abdomen Pelvis Wo Contrast  01/14/2012  *RADIOLOGY REPORT*  Clinical Data: Doses question bladder mass or diverticula on ultrasound  CT ABDOMEN AND  PELVIS WITHOUT CONTRAST  Technique:  Multidetector CT imaging of the abdomen and pelvis was performed following the standard protocol without intravenous contrast. Oral contrast not administered.  Sagittal and coronal MPR images reconstructed from axial data set.  Comparison: Renal ultrasound 01/12/2012  Findings: Bibasilar pleural effusions and atelectasis. Mildly complicated cyst with wall calcification lateral aspect upper pole left kidney 4.1 x 4.4 x 4.0 cm. Moderate left hydronephrosis with duplication of left  ureter into upper left pelvis. No right hydronephrosis but mild right ureteral dilatation present. Marked enlargement of prostate gland, approximately 8.0 x 6.5 x 6.3 cm. Bilateral bladder diverticula are identified anterior and superior to the ureteral orifices, containing dependent calculi. Additional diverticulum at dome of bladder containing air. Foley catheter present. Diffuse bladder wall thickening identified, unable to exclude tumor.  Calcified granulomata in liver and spleen. Distended gallbladder. Scattered respiratory motion artifacts. No definite additional focal abnormalities of liver, spleen, pancreas, or adrenal glands. Moderate sized hiatal hernia. Extensive atherosclerotic calcifications. Normal appendix. Colonic diverticulosis. No mass, adenopathy, free fluid, or inflammatory process. No acute osseous findings.  IMPRESSION: Marked prostatic enlargement. Diffuse bladder wall thickening, can be seen with inflammatory and neoplastic processes; correlation with cystoscopy recommended. Multiple bladder diverticula some which contain dependent calculi. 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 Mild right ureteral dilatation without hydronephrosis. Sigmoid diverticulosis. Hiatal hernia. Bibasilar pleural effusions and atelectasis. Extensive atherosclerotic disease.  Original Report Authenticated By: Lollie Marrow, M.D.   Dg Chest 2  View  01/27/2012  *RADIOLOGY REPORT*  Clinical Data: Shortness of breath, hypertension  CHEST - 2 VIEW  Comparison: 01/12/2012, 01/11/2012  Findings: Right PICC line tip in the SVC RA junction.  Very low lung volumes noted.  Cardiomegaly evident with vascular congestion and increased streaky basilar atelectasis/airspace disease.  Lower lobe pneumonia not excluded.  No large effusion or pneumothorax. No definite CHF.  Small effusions suspected on the lateral view.  IMPRESSION: Perihilar and lower lobe streaky atelectasis and/or airspace disease.  Basilar pneumonia not excluded.  Cardiomegaly without definite superimposed CHF  Small effusions  Original Report Authenticated By: Judie Petit. Ruel Favors, M.D.   Mr Thoracic Spine Wo Contrast  01/16/2012  *RADIOLOGY REPORT*  Clinical Data: Back pain.  Bacteremia.  MRI THORACIC SPINE WITHOUT CONTRAST  Technique:  Multiplanar and multiecho pulse sequences of the thoracic spine were obtained without intravenous contrast.  Comparison: None.  Findings: There is no evidence of fracture in the thoracic region.  T1-2 and T2-3 are unremarkable.  The T3-4 disc shows anterior osteophyte formation.  No canal compromise.  In the region from T4- T7, the vertebral bodies appear fused.  The T7-8 disc shows degenerative desiccation but no bulge or herniation.  In the region from T8-T10, the vertebral bodies appear fused.  The T10-11 disc shows some Schmorl's nodes and some slightly increased water signal, but there are are no destructive changes or edematous changes in the adjacent vertebral bodies.  Therefore, this is felt unlikely to relate to infection.  The T11-12 disc shows mild degenerative change with lower level T2 signal.  The T12-L1 disc is unremarkable.  The spinal canal is widely patent.  Ample subarachnoid space surrounds the cord.  No cord pathology.  No facet arthropathy. Paravertebral soft tissues show bilateral pleural effusions with dependent pulmonary atelectasis.  There is  either some loculated fluid or a cyst in the lower right pleural space.  IMPRESSION: No evidence of acute spinal pathology.  Segments of fusion, including from T4-T7 and T8-T10.  Mild degenerative changes at the disc levels that are not fused.  There is some T2 signal within the T10-11 and T11-12 discs, but the endplates do not appear irregular and there is no vertebral body edema.  Therefore, I think it is unlikely that there is diskitis at those levels.  Conceivably, early diskitis can look this way but I think that is unlikely.  Bilateral pleural effusions with pulmonary atelectasis.  Possible cyst or loculation in the right lower chest.  Original Report Authenticated By: Thomasenia Sales, M.D.   US Renal  01/12/2012  *RADIOLOGY REPORT*  Clinical Data: 78 year old male with hematuria, UTI, acute renal failure.  RENAL/URINARY TRACT ULTRASOUND COMPLETE  Comparison:  None.  Findings:  Right Kidney:  Hydronephrosis.  Evidence of echogenic debris in the right renal pelvis and proximal ureter.  Hydroureter is evident. Renal length 11.5 cm.  No focal right renal lesion is identified.  Left Kidney:  Hydronephrosis, moderate to severe in greater than that on the right.  No debris evident in the left renal collecting system.  Hydroureter is evident.  There is a superimposed simple appearing exophytic renal cyst measuring up to 48 mm diameter. Renal length is 12.9 cm.  Bladder:  At least partially decompressed, and a Foley catheter balloon is partially visible.  However, to complex appearing cystic structures are noted adjacent to the bladder.  These might be bladder diverticula containing debris, uncertain.  The bladder also has a heterogeneous appearance.  Furthermore there is a linear echogenic structure partially visible which might represent a stent, uncertain.  IMPRESSION: Left greater than right bilateral obstructive uropathy.  The obstructing etiology is not identified, but the bladder is very heterogeneous such  that an obstructing bladder mass is not excluded. There is evidence of echogenic debris in the obstructed right side system, which may indicate pyonephrosis.  Additionally, there may be bladder diverticula containing debris. Overall, recommend follow-up CT abdomen and pelvis (can be done without contrast if necessary) to better characterize.  Original Report Authenticated By: Harley Hallmark, M.D.   Dg Chest Port 1 View  01/12/2012  *RADIOLOGY REPORT*  Clinical Data: T kidney.  PORTABLE CHEST - 1 VIEW  Comparison: 01/11/2012.  Findings: Mild pulmonary vascular congestion.  Slight increased markings lung bases may represent atelectasis.  Subtle infiltrate not entirely excluded.  Hiatal hernia.  Heart size top normal. Minimally tortuous aorta.  No pneumothorax.  IMPRESSION: Pulmonary vascular congestion.  Slight increased markings lung bases as noted above.  Hiatal hernia.  Original Report Authenticated By: Fuller Canada, M.D.   Dg Chest Port 1 View  01/11/2012  *RADIOLOGY REPORT*  Clinical Data: Weakness.  Shortness of breath.  PORTABLE CHEST - 1 VIEW  Comparison: None.  Findings: Enlarged cardiac silhouette.  Moderately large hiatal hernia.  Clear lungs with normal vascularity.  Bilateral acromioclavicular joint degenerative changes.  IMPRESSION:  1.  Cardiomegaly. 2.  Moderately large hiatal hernia.  Original Report Authenticated By: Darrol Angel, M.D.    Microbiology: Recent Results (from the past 240 hour(s))  CULTURE, BLOOD (ROUTINE X 2)     Status: Normal (Preliminary result)   Collection Time   01/27/12  5:23 PM      Component Value Range Status Comment   Specimen Description BLOOD ARM LEFT   Final    Special Requests BOTTLES DRAWN AEROBIC AND ANAEROBIC 10CC   Final    Culture  Setup Time 01/28/2012 00:25   Final    Culture     Final    Value:        BLOOD CULTURE RECEIVED NO GROWTH TO DATE CULTURE WILL BE HELD FOR 5 DAYS BEFORE ISSUING A FINAL NEGATIVE REPORT   Report Status PENDING    Incomplete   CULTURE, BLOOD (ROUTINE X 2)     Status: Normal (Preliminary result)   Collection Time   01/27/12  5:29 PM      Component Value Range Status  Comment   Specimen Description BLOOD HAND LEFT   Final    Special Requests BOTTLES DRAWN AEROBIC AND ANAEROBIC 10CC   Final    Culture  Setup Time 01/28/2012 00:25   Final    Culture     Final    Value:        BLOOD CULTURE RECEIVED NO GROWTH TO DATE CULTURE WILL BE HELD FOR 5 DAYS BEFORE ISSUING A FINAL NEGATIVE REPORT   Report Status PENDING   Incomplete   MRSA PCR SCREENING     Status: Normal   Collection Time   01/27/12  7:36 PM      Component Value Range Status Comment   MRSA by PCR NEGATIVE  NEGATIVE Final      Labs: Basic Metabolic Panel:  Lab 01/28/12 1610 01/27/12 1620  NA 137 135  K 4.0 4.0  CL 102 100  CO2 26 28  GLUCOSE 96 95  BUN 42* 49*  CREATININE 1.58* 1.88*  CALCIUM 9.2 9.5  MG -- --  PHOS -- --   Liver Function Tests:  Lab 01/28/12 0300  AST 14  ALT 10  ALKPHOS 120*  BILITOT 0.2*  PROT 6.9  ALBUMIN 1.8*   No results found for this basename: LIPASE:5,AMYLASE:5 in the last 168 hours No results found for this basename: AMMONIA:5 in the last 168 hours CBC:  Lab 01/28/12 0257 01/27/12 1620  WBC 11.9* 13.5*  NEUTROABS -- 12.1*  HGB 7.2* 7.4*  HCT 21.7* 22.7*  MCV 90.0 91.5  PLT 317 373   Cardiac Enzymes: No results found for this basename: CKTOTAL:5,CKMB:5,CKMBINDEX:5,TROPONINI:5 in the last 168 hours BNP: No components found with this basename: POCBNP:5 CBG: No results found for this basename: GLUCAP:5 in the last 168 hours  Time coordinating discharge: Greater than 35 minutes.  Signed:  Marinda Elk  [facility] 01/29/2012, 9:00 AM

## 2012-01-29 NOTE — Progress Notes (Signed)
Trueman Klimas to be D/C'd Hospice per MD order.  Report called to Dayna Barker RN at [facility]. All questions answered for Clydie Braun. PICC line and Foley left in place.  Last Vital Signs:  Blood pressure 118/69, pulse 74, temperature 98.4 F (36.9 C), temperature source Oral, resp. rate 16, height 5\' 10"  (1.778 m), weight 73 kg (160 lb 15 oz), SpO2 94.00%.  Susann Givens, RN, [facility] 01/29/2012 11:05 AM

## 2012-01-29 NOTE — Progress Notes (Signed)
Patient Jack Stevens, 78 year old white male native of [place] feels some anxiety about his transition "from home to hospice."  Patient speaks highly of his two daughters and his brother.  He expressed appreciation for Chaplain's provision of pastoral prayer, presence, and conversation.  No follow up needed as patient may be discharged today.

## 2012-01-29 NOTE — Care Management Note (Signed)
    Page 1 of 1   01/29/2012     11:23:39 AM   CARE MANAGEMENT NOTE 01/29/2012  Patient:  Jack Stevens, Jack Stevens   Account Number:  [id number]  Date Initiated:  01/28/2012  Documentation initiated by:  Letha Cape  Subjective/Objective Assessment:   dx hcap, hypoxia, normocytic anemia  admit- from [facility].     Action/Plan:   Palliative Consult- Residential Hospice   Anticipated DC Date:  01/29/2012   Anticipated DC Plan:  HOSPICE MEDICAL FACILITY  In-house referral  Clinical Social Worker      DC Planning Services  CM consult      [facility] Choice  HOSPICE   Choice offered to / List presented to:             Status of service:  Completed, signed off Medicare Important Message given?   (If response is "NO", the following Medicare IM given date fields will be blank) Date Medicare IM given:   Date Additional Medicare IM given:    Discharge Disposition:  HOSPICE MEDICAL FACILITY  Per UR Regulation:  Reviewed for med. necessity/level of care/duration of stay  If discussed at Long Length of Stay Meetings, dates discussed:    Comments:  01/29/12 11:22 Letha Cape RN, BSN [phone] patient dc to [facility].  01/28/12 11:11 Letha Cape RN, BSN [phone] patient is from  [facility], Palliative consult, family has decided on Residential Hospice.  CSW referral.

## 2012-01-30 LAB — URINE CULTURE: Colony Count: >=100000

## 2012-02-01 NOTE — Consult Note (Signed)
I have reviewed and discussed the care of this patient in detail with the nurse practitioner including pertinent patient records, physical exam findings and data. I agree with details of this encounter.  

## 2012-02-03 LAB — CULTURE, BLOOD (ROUTINE X 2): Culture: NO GROWTH

## 2012-06-22 ENCOUNTER — Encounter (HOSPITAL_COMMUNITY): Payer: Self-pay | Admitting: Emergency Medicine

## 2012-06-22 ENCOUNTER — Emergency Department (HOSPITAL_COMMUNITY)
Admission: EM | Admit: 2012-06-22 | Discharge: 2012-06-22 | Disposition: A | Discharge status: Skilled Nursing Facility | Attending: Emergency Medicine | Admitting: Emergency Medicine

## 2012-06-22 DIAGNOSIS — Z8701 Personal history of pneumonia (recurrent): Secondary | ICD-10-CM | POA: Insufficient documentation

## 2012-06-22 DIAGNOSIS — D638 Anemia in other chronic diseases classified elsewhere: Secondary | ICD-10-CM | POA: Insufficient documentation

## 2012-06-22 DIAGNOSIS — F329 Major depressive disorder, single episode, unspecified: Secondary | ICD-10-CM | POA: Insufficient documentation

## 2012-06-22 DIAGNOSIS — Z8669 Personal history of other diseases of the nervous system and sense organs: Secondary | ICD-10-CM | POA: Insufficient documentation

## 2012-06-22 DIAGNOSIS — N189 Chronic kidney disease, unspecified: Secondary | ICD-10-CM | POA: Insufficient documentation

## 2012-06-22 DIAGNOSIS — Z79899 Other long term (current) drug therapy: Secondary | ICD-10-CM | POA: Insufficient documentation

## 2012-06-22 DIAGNOSIS — F3289 Other specified depressive episodes: Secondary | ICD-10-CM | POA: Insufficient documentation

## 2012-06-22 DIAGNOSIS — I129 Hypertensive chronic kidney disease with stage 1 through stage 4 chronic kidney disease, or unspecified chronic kidney disease: Secondary | ICD-10-CM | POA: Insufficient documentation

## 2012-06-22 DIAGNOSIS — F411 Generalized anxiety disorder: Secondary | ICD-10-CM | POA: Insufficient documentation

## 2012-06-22 DIAGNOSIS — F17200 Nicotine dependence, unspecified, uncomplicated: Secondary | ICD-10-CM | POA: Insufficient documentation

## 2012-06-22 DIAGNOSIS — F4310 Post-traumatic stress disorder, unspecified: Secondary | ICD-10-CM | POA: Insufficient documentation

## 2012-06-22 DIAGNOSIS — N400 Enlarged prostate without lower urinary tract symptoms: Secondary | ICD-10-CM | POA: Insufficient documentation

## 2012-06-22 DIAGNOSIS — D649 Anemia, unspecified: Secondary | ICD-10-CM

## 2012-06-22 LAB — COMPREHENSIVE METABOLIC PANEL
ALT: 5 U/L (ref 0–53)
AST: 10 U/L (ref 0–37)
Albumin: 1.7 g/dL — ABNORMAL LOW (ref 3.5–5.2)
Alkaline Phosphatase: 134 U/L — ABNORMAL HIGH (ref 39–117)
BUN: 47 mg/dL — ABNORMAL HIGH (ref 6–23)
CO2: 27 mEq/L (ref 19–32)
Calcium: 9.5 mg/dL (ref 8.4–10.5)
Chloride: 104 mEq/L (ref 96–112)
Creatinine, Ser: 2.72 mg/dL — ABNORMAL HIGH (ref 0.50–1.35)
GFR calc Af Amer: 22 mL/min — ABNORMAL LOW (ref >90)
GFR calc non Af Amer: 19 mL/min — ABNORMAL LOW (ref >90)
Glucose, Bld: 105 mg/dL — ABNORMAL HIGH (ref 70–99)
Potassium: 3.7 mEq/L (ref 3.5–5.1)
Sodium: 138 mEq/L (ref 135–145)
Total Bilirubin: 0.1 mg/dL — ABNORMAL LOW (ref 0.3–1.2)
Total Protein: 6.5 g/dL (ref 6.0–8.3)

## 2012-06-22 LAB — CBC
HCT: 22.7 % — ABNORMAL LOW (ref 39.0–52.0)
Hemoglobin: 7.2 g/dL — ABNORMAL LOW (ref 13.0–17.0)
MCH: 26.7 pg (ref 26.0–34.0)
MCHC: 31.7 g/dL (ref 30.0–36.0)
MCV: 84.1 fL (ref 78.0–100.0)
Platelets: 208 10*3/uL (ref 150–400)
RBC: 2.7 MIL/uL — ABNORMAL LOW (ref 4.22–5.81)
RDW: 20.3 % — ABNORMAL HIGH (ref 11.5–15.5)
WBC: 17.6 10*3/uL — ABNORMAL HIGH (ref 4.0–10.5)

## 2012-06-22 LAB — TYPE AND SCREEN
ABO/RH(D): A POS
Antibody Screen: NEGATIVE

## 2012-06-22 MED ORDER — SODIUM CHLORIDE 0.9 % IV SOLN
Freq: Once | INTRAVENOUS | Status: AC
  Administered 2012-06-22: 17:00:00 via INTRAVENOUS

## 2012-06-22 MED ORDER — SODIUM CHLORIDE 0.9 % IV BOLUS (SEPSIS)
500.0000 mL | Freq: Once | INTRAVENOUS | Status: AC
  Administered 2012-06-22: 500 mL via INTRAVENOUS

## 2012-06-22 NOTE — Progress Notes (Signed)
CSW confirmed that patient is from [facility]. Pt will return when medically stable.  Catha Gosselin, LCSWA  [phone] .06/22/2012 1904pm

## 2012-06-22 NOTE — ED Provider Notes (Signed)
History    78 year old male sent from nursing home for evaluation of anemia. Patient's hemoglobin 6.7. Patient is not able to provide me with much useful history. Most of the information is based on review of previous records and recent admissions. The patient has a history of baseline anemia. No report of bright red blood per Amer melena. Per review of medication list, patient is not on blood thinning medications.    CSN: [id number]  Arrival date & time 06/22/12  1634   First MD Initiated Contact with Patient 06/22/12 1644      Chief Complaint  Patient presents with  . Anemia    (Consider location/radiation/quality/duration/timing/severity/associated sxs/prior treatment) HPI  Past Medical History  Diagnosis Date  . Renal disorder   . Chronic renal failure   . BPH (benign prostatic hyperplasia)   . Seizure   . Hypertension   . Depression   . MGUS (monoclonal gammopathy of unknown significance)   . Anxiety   . PTSD (post-traumatic stress disorder)   . Diverticulitis   . Anemia   . Pneumonia   . Urinary tract infection   . Bacteremia   . Chronic indwelling foley catheter     Past Surgical History  Procedure Date  . Tonsillectomy     No family history on file.  History  Substance Use Topics  . Smoking status: Current Some Day Smoker  . Smokeless tobacco: Not on file  . Alcohol Use: No      Review of Systems  Level V caveat applies because patient is demented and unable to provide much useful history   Allergies  Review of patient's allergies indicates no known allergies.  Home Medications   Current Outpatient Rx  Name  Route  Sig  Dispense  Refill  . ALPRAZOLAM 0.5 MG PO TABS   Oral   Take 0.5 mg by mouth 3 (three) times daily as needed. Anxiety.         Marland Kitchen DILTIAZEM HCL ER COATED BEADS 120 MG PO CP24   Oral   Take 1 capsule (120 mg total) by mouth daily.         Marland Kitchen HYDROCODONE-ACETAMINOPHEN 10-325 MG/15ML PO SOLN   Oral   Take 15 mLs by  mouth 4 (four) times daily as needed. Severe pain         . DERMACLOUD EX   Apply externally   Apply 1 application topically 3 (three) times daily.         Marland Kitchen ONDANSETRON 4 MG PO TBDP   Oral   Take 4 mg by mouth every 6 (six) hours as needed. Nausea/vomiting.         Marland Kitchen PRAMOXINE HCL-ZINC OXIDE 1-5 % RE CREA   Rectal   Place 1 application rectally every 2 (two) hours as needed. HEMORHOIDS         . SENNA-DOCUSATE SODIUM 8.6-50 MG PO TABS   Oral   Take 1 tablet by mouth 2 (two) times daily.         . SERTRALINE HCL 100 MG PO TABS   Oral   Take 150 mg by mouth daily.           BP 131/68  Pulse 78  SpO2 99%  Physical Exam  Nursing note and vitals reviewed. Constitutional: He appears well-developed and well-nourished. No distress.       Laying in bed. Tired appearing but not toxic.  HENT:  Head: Normocephalic and atraumatic.  Eyes: Conjunctivae normal are normal. Pupils are equal,  round, and reactive to light. Right eye exhibits no discharge. Left eye exhibits no discharge.  Neck: Neck supple.  Cardiovascular: Normal rate, regular rhythm and normal heart sounds.  Exam reveals no gallop and no friction rub.   No murmur heard. Pulmonary/Chest: Effort normal and breath sounds normal. No respiratory distress.  Abdominal: Soft. He exhibits no distension. There is no tenderness.  Genitourinary:       Stage I sacral decubitus ulcer. Digital rectal exam: Skin tags. Poor rectal tone. Very enlarged prostate. No stool palpated rectal vault. Brown stool noted on glove. Weakly heme-positive. Foley catheter in place.  Musculoskeletal: He exhibits no edema and no tenderness.  Neurological:       Drowsy, but awakens to voice. Follows basic commands.  Skin: Skin is warm and dry.  Psychiatric: He has a normal mood and affect. His behavior is normal. Thought content normal.    ED Course  Procedures (including critical care time)  Labs Reviewed  CBC - Abnormal; Notable for the  following:    WBC 17.6 (*)     RBC 2.70 (*)     Hemoglobin 7.2 (*)     HCT 22.7 (*)     RDW 20.3 (*)     All other components within normal limits  COMPREHENSIVE METABOLIC PANEL - Abnormal; Notable for the following:    Glucose, Bld 105 (*)     BUN 47 (*)     Creatinine, Ser 2.72 (*)     Albumin 1.7 (*)     Alkaline Phosphatase 134 (*)     Total Bilirubin 0.1 (*)     GFR calc non Af Amer 19 (*)     GFR calc Af Amer 22 (*)     All other components within normal limits  TYPE AND SCREEN   No results found.   1. Chronic anemia       MDM   78 year old male sent for evaluation of anemia. Patient's 7.2. Reviewing previous records, patient's hemoglobin was 7.2 on most recent discharge. His stool is weakly heme-positive but given hemoglobin at his baseline, apparent wishes to not pursue aggressive therapy and hemodynamic stability, I feel is safe for discharge back to his facility. Pt with hx of failure to thrive and recent evaluation by palliative care. Albumin remains low consistent with prior labs as well. Increase in Cr from previous labs. Did receive IVF.        Raeford Razor, MD 06/22/12 [id number]

## 2012-06-22 NOTE — ED Notes (Signed)
ZOX:[id number]<AV> Expected date:06/22/12<BR> Expected time: 4:12 PM<BR> Means of arrival:Ambulance<BR> Comments:<BR> Ems/low Hgb

## 2012-06-22 NOTE — ED Notes (Signed)
PER EMS- pt picked up from nursing home- [facility] with c/o low HGB of 6.7.  Wanted pt to transported here for evaluation. Usually on home oxygen 2-4L.  Alert and oriented per baseline.  Pt is DNR. NKDA.

## 2012-07-01 ENCOUNTER — Emergency Department (HOSPITAL_COMMUNITY)
Admission: EM | Admit: 2012-07-01 | Discharge: 2012-07-01 | Disposition: A | Discharge status: Home / Self Care | Attending: Emergency Medicine | Admitting: Emergency Medicine

## 2012-07-01 ENCOUNTER — Encounter (HOSPITAL_COMMUNITY): Payer: Self-pay | Admitting: Emergency Medicine

## 2012-07-01 DIAGNOSIS — F411 Generalized anxiety disorder: Secondary | ICD-10-CM | POA: Insufficient documentation

## 2012-07-01 DIAGNOSIS — N189 Chronic kidney disease, unspecified: Secondary | ICD-10-CM | POA: Insufficient documentation

## 2012-07-01 DIAGNOSIS — I129 Hypertensive chronic kidney disease with stage 1 through stage 4 chronic kidney disease, or unspecified chronic kidney disease: Secondary | ICD-10-CM | POA: Insufficient documentation

## 2012-07-01 DIAGNOSIS — F329 Major depressive disorder, single episode, unspecified: Secondary | ICD-10-CM | POA: Insufficient documentation

## 2012-07-01 DIAGNOSIS — Z8669 Personal history of other diseases of the nervous system and sense organs: Secondary | ICD-10-CM | POA: Insufficient documentation

## 2012-07-01 DIAGNOSIS — F3289 Other specified depressive episodes: Secondary | ICD-10-CM | POA: Insufficient documentation

## 2012-07-01 DIAGNOSIS — Z8701 Personal history of pneumonia (recurrent): Secondary | ICD-10-CM | POA: Insufficient documentation

## 2012-07-01 DIAGNOSIS — Z87448 Personal history of other diseases of urinary system: Secondary | ICD-10-CM | POA: Insufficient documentation

## 2012-07-01 DIAGNOSIS — F17200 Nicotine dependence, unspecified, uncomplicated: Secondary | ICD-10-CM | POA: Insufficient documentation

## 2012-07-01 DIAGNOSIS — Z79899 Other long term (current) drug therapy: Secondary | ICD-10-CM | POA: Insufficient documentation

## 2012-07-01 DIAGNOSIS — D649 Anemia, unspecified: Secondary | ICD-10-CM | POA: Insufficient documentation

## 2012-07-01 DIAGNOSIS — N390 Urinary tract infection, site not specified: Secondary | ICD-10-CM | POA: Insufficient documentation

## 2012-07-01 DIAGNOSIS — K649 Unspecified hemorrhoids: Secondary | ICD-10-CM | POA: Insufficient documentation

## 2012-07-01 LAB — CBC WITH DIFFERENTIAL/PLATELET
Basophils Absolute: 0 10*3/uL (ref 0.0–0.1)
Basophils Relative: 0 % (ref 0–1)
Eosinophils Absolute: 0.1 10*3/uL (ref 0.0–0.7)
Eosinophils Relative: 1 % (ref 0–5)
Hemoglobin: 7.1 g/dL — ABNORMAL LOW (ref 13.0–17.0)
MCHC: 31.7 g/dL (ref 30.0–36.0)
Monocytes Absolute: 0.7 10*3/uL (ref 0.1–1.0)
RBC: 2.7 MIL/uL — ABNORMAL LOW (ref 4.22–5.81)
RDW: 18.3 % — ABNORMAL HIGH (ref 11.5–15.5)
WBC: 9.8 10*3/uL (ref 4.0–10.5)

## 2012-07-01 LAB — URINALYSIS, ROUTINE W REFLEX MICROSCOPIC
Glucose, UA: NEGATIVE mg/dL
Nitrite: POSITIVE — AB
Specific Gravity, Urine: 1.014 (ref 1.005–1.030)
pH: 6 (ref 5.0–8.0)

## 2012-07-01 LAB — COMPREHENSIVE METABOLIC PANEL
Albumin: 1.7 g/dL — ABNORMAL LOW (ref 3.5–5.2)
BUN: 10 mg/dL (ref 6–23)
Calcium: 8.3 mg/dL — ABNORMAL LOW (ref 8.4–10.5)
GFR calc Af Amer: 86 mL/min — ABNORMAL LOW (ref >90)
Glucose, Bld: 98 mg/dL (ref 70–99)
Potassium: 3.3 mEq/L — ABNORMAL LOW (ref 3.5–5.1)
Sodium: 134 mEq/L — ABNORMAL LOW (ref 135–145)
Total Protein: 6.6 g/dL (ref 6.0–8.3)

## 2012-07-01 LAB — URINE MICROSCOPIC-ADD ON

## 2012-07-01 MED ORDER — CEPHALEXIN 500 MG PO CAPS
500.0000 mg | ORAL_CAPSULE | Freq: Once | ORAL | Status: AC
  Administered 2012-07-01: 500 mg via ORAL
  Filled 2012-07-01: qty 1

## 2012-07-01 MED ORDER — ALPRAZOLAM 0.5 MG PO TABS
0.5000 mg | ORAL_TABLET | Freq: Once | ORAL | Status: AC
  Administered 2012-07-01: 0.5 mg via ORAL
  Filled 2012-07-01: qty 1

## 2012-07-01 MED ORDER — SODIUM CHLORIDE 0.9 % IV BOLUS (SEPSIS)
500.0000 mL | Freq: Once | INTRAVENOUS | Status: AC
  Administered 2012-07-01: 500 mL via INTRAVENOUS

## 2012-07-01 MED ORDER — CEPHALEXIN 500 MG PO CAPS: 500.0000 mg | ORAL_CAPSULE | Freq: Four times a day (QID) | ORAL | Status: DC

## 2012-07-01 NOTE — ED Provider Notes (Signed)
History     CSN: [id number]  Arrival date & time 07/01/12  1533   First MD Initiated Contact with Patient 07/01/12 1613      No chief complaint on file.   (Consider location/radiation/quality/duration/timing/severity/associated sxs/prior treatment) HPI Comments: Nursing home sent the patient to do to a hemoglobin of 6.5 and stated the patient was feeling more weak than normal. However the patient denies this and states he feels fine  The history is provided by the nursing home and the EMS personnel. The history is limited by the absence of a caregiver (dementia).    Past Medical History  Diagnosis Date  . Renal disorder   . Chronic renal failure   . BPH (benign prostatic hyperplasia)   . Seizure   . Hypertension   . Depression   . MGUS (monoclonal gammopathy of unknown significance)   . Anxiety   . PTSD (post-traumatic stress disorder)   . Diverticulitis   . Anemia   . Pneumonia   . Urinary tract infection   . Bacteremia   . Chronic indwelling foley catheter     Past Surgical History  Procedure Date  . Tonsillectomy     No family history on file.  History  Substance Use Topics  . Smoking status: Current Some Day Smoker  . Smokeless tobacco: Not on file  . Alcohol Use: No      Review of Systems  Unable to perform ROS   Allergies  Morphine and related  Home Medications   Current Outpatient Rx  Name  Route  Sig  Dispense  Refill  . ALPRAZOLAM 0.5 MG PO TABS   Oral   Take 0.5 mg by mouth 3 (three) times daily as needed. Anxiety.         Marland Kitchen BISACODYL 10 MG RE SUPP   Rectal   Place 10 mg rectally as needed. Constipation         . DILTIAZEM HCL ER COATED BEADS 120 MG PO CP24   Oral   Take 1 capsule (120 mg total) by mouth daily.         Marland Kitchen FERROUS SULFATE 325 (65 FE) MG PO TABS   Oral   Take 325 mg by mouth daily with breakfast.         . POLYETHYLENE GLYCOL 3350 PO PACK   Oral   Take 17 g by mouth daily as needed. Constipation        . PRAMOXINE HCL-ZINC OXIDE 1-5 % RE CREA   Rectal   Place 1 application rectally every 2 (two) hours as needed. HEMORHOIDS         . SENNA-DOCUSATE SODIUM 8.6-50 MG PO TABS   Oral   Take 1 tablet by mouth 2 (two) times daily.         . SERTRALINE HCL 100 MG PO TABS   Oral   Take 150 mg by mouth daily.         Annamaria Boots EX   Apply externally   Apply 1 application topically 3 (three) times daily.         Marland Kitchen ONDANSETRON 4 MG PO TBDP   Oral   Take 4 mg by mouth every 6 (six) hours as needed. Nausea/vomiting.           BP 140/73  Pulse 67  Temp 98.3 F (36.8 C) (Oral)  Resp 17  SpO2 100%  Physical Exam  Nursing note and vitals reviewed. Constitutional: He appears well-developed and well-nourished. No distress.  HENT:  Head: Normocephalic and atraumatic.  Mouth/Throat: Oropharynx is clear and moist.  Eyes: EOM are normal. Pupils are equal, round, and reactive to light.       Pale conjunctivae  Neck: Normal range of motion. Neck supple.  Cardiovascular: Normal rate, regular rhythm and intact distal pulses.   No murmur heard. Pulmonary/Chest: Effort normal and breath sounds normal. No respiratory distress. He has no wheezes. He has no rales.  Abdominal: Soft. He exhibits no distension. There is no tenderness. There is no rebound and no guarding.  Genitourinary:       Indwelling chronic Foley catheter with tubing that has apparent sediment  Musculoskeletal: Normal range of motion. He exhibits no edema and no tenderness.  Neurological: He is alert. He has normal strength. No sensory deficit.       Baseline dementia but is oriented to person  Skin: Skin is warm and dry. No rash noted. No erythema. There is pallor.  Psychiatric: He has a normal mood and affect. His behavior is normal.    ED Course  Procedures (including critical care time)  Labs Reviewed  CBC WITH DIFFERENTIAL - Abnormal; Notable for the following:    RBC 2.70 (*)     Hemoglobin 7.1 (*)      HCT 22.4 (*)     RDW 18.3 (*)     Platelets 426 (*)     Neutrophils Relative 80 (*)     Neutro Abs 7.8 (*)     Lymphocytes Relative 11 (*)     All other components within normal limits  COMPREHENSIVE METABOLIC PANEL - Abnormal; Notable for the following:    Sodium 134 (*)     Potassium 3.3 (*)     Calcium 8.3 (*)     Albumin 1.7 (*)     Total Bilirubin 0.1 (*)     GFR calc non Af Amer 74 (*)     GFR calc Af Amer 86 (*)     All other components within normal limits  URINALYSIS, ROUTINE W REFLEX MICROSCOPIC - Abnormal; Notable for the following:    APPearance TURBID (*)     Hgb urine dipstick LARGE (*)     Protein, ur 100 (*)     Nitrite POSITIVE (*)     Leukocytes, UA LARGE (*)     All other components within normal limits  URINE MICROSCOPIC-ADD ON - Abnormal; Notable for the following:    Squamous Epithelial / LPF FIELD OBSCURED BY WBC'S (*)     Bacteria, UA FIELD OBSCURED BY WBC'S (*)     All other components within normal limits  URINE CULTURE   No results found.   No diagnosis found.    MDM   Patient with a history of chronic anemia with hemoglobins have run in the low sevens and was sent here from his nursing home because they checked a hemoglobin of 6.5. Patient has some hemorrhoids and had bright blood in his stool but he denies any abdominal pain. He states he feels in his normal state of health. He does have an indwelling Foley catheter has a large amount of sediment in the tube and is unclear when this was last changed. Patient is a poor historian and is not able to give a detailed past medical history however he is able to answer yes and no questions and help out his current symptoms the way he feels. We'll recheck hemoglobin however at this point he has no active areas of bleeding. He  does have evidence of some external hemorrhoids which is most likely was causing the bright red blood. He satting 100% on his normal 2 L of oxygen and has stable vital signs. CBC,  CMP, UA pending  9:24 PM Labs are stable with hb that is stable at 7.  Cr and WBC returned to normal.  UA today with UTI.  Chronic foley with new placed.  Pt is eating and drinking.  Culture done and will start on keflex.      Gwyneth Sprout, MD 07/01/12 2125

## 2012-07-01 NOTE — ED Notes (Signed)
[facility] called for patient transport 

## 2012-07-01 NOTE — ED Notes (Signed)
MD aware that patient not putting out much urine through catheter.  Not able to obtain urine sample

## 2012-07-01 NOTE — ED Notes (Signed)
Patient's indwelling catheter replaced to get urine sample for lab.

## 2012-07-01 NOTE — ED Notes (Signed)
Patient from [facility] sent here for hemoglobin of 6.5.  Facility care workers had seen some small amounts of blood in stool.  Has history of hemorrhoids as well.  Patient is alert and oriented but has felt weaker than usual.  Patient uses 2 liters of O2 at the facility for acute respiratory failure.  Pt also has history of anemia.

## 2012-07-01 NOTE — ED Notes (Signed)
OZH:[id number]<HQ> Expected date:<BR> Expected time:<BR> Means of arrival:<BR> Comments:<BR> Low hgb

## 2012-07-04 LAB — URINE CULTURE

## 2012-07-05 NOTE — ED Notes (Signed)
+   urine Patient treated with Kelfex-sensitive to same-chart appended per protocol MD. 

## 2012-08-18 ENCOUNTER — Encounter (HOSPITAL_COMMUNITY): Payer: Self-pay | Admitting: Emergency Medicine

## 2012-08-18 ENCOUNTER — Inpatient Hospital Stay (HOSPITAL_COMMUNITY)

## 2012-08-18 ENCOUNTER — Inpatient Hospital Stay (HOSPITAL_COMMUNITY)
Admission: EM | Admit: 2012-08-18 | Discharge: 2012-08-22 | DRG: 872 | Disposition: A | Discharge status: Skilled Nursing Facility | Attending: Internal Medicine | Admitting: Internal Medicine

## 2012-08-18 ENCOUNTER — Emergency Department (HOSPITAL_COMMUNITY)

## 2012-08-18 DIAGNOSIS — D631 Anemia in chronic kidney disease: Secondary | ICD-10-CM | POA: Diagnosis present

## 2012-08-18 DIAGNOSIS — R319 Hematuria, unspecified: Secondary | ICD-10-CM | POA: Diagnosis present

## 2012-08-18 DIAGNOSIS — D62 Acute posthemorrhagic anemia: Secondary | ICD-10-CM

## 2012-08-18 DIAGNOSIS — I129 Hypertensive chronic kidney disease with stage 1 through stage 4 chronic kidney disease, or unspecified chronic kidney disease: Secondary | ICD-10-CM | POA: Diagnosis present

## 2012-08-18 DIAGNOSIS — Z66 Do not resuscitate: Secondary | ICD-10-CM | POA: Diagnosis present

## 2012-08-18 DIAGNOSIS — N039 Chronic nephritic syndrome with unspecified morphologic changes: Secondary | ICD-10-CM | POA: Diagnosis present

## 2012-08-18 DIAGNOSIS — Z515 Encounter for palliative care: Secondary | ICD-10-CM

## 2012-08-18 DIAGNOSIS — E876 Hypokalemia: Secondary | ICD-10-CM | POA: Diagnosis present

## 2012-08-18 DIAGNOSIS — F411 Generalized anxiety disorder: Secondary | ICD-10-CM | POA: Diagnosis present

## 2012-08-18 DIAGNOSIS — N390 Urinary tract infection, site not specified: Secondary | ICD-10-CM | POA: Diagnosis present

## 2012-08-18 DIAGNOSIS — R7881 Bacteremia: Secondary | ICD-10-CM | POA: Diagnosis present

## 2012-08-18 DIAGNOSIS — R5381 Other malaise: Secondary | ICD-10-CM | POA: Diagnosis present

## 2012-08-18 DIAGNOSIS — N400 Enlarged prostate without lower urinary tract symptoms: Secondary | ICD-10-CM | POA: Diagnosis present

## 2012-08-18 DIAGNOSIS — E860 Dehydration: Secondary | ICD-10-CM

## 2012-08-18 DIAGNOSIS — N179 Acute kidney failure, unspecified: Secondary | ICD-10-CM | POA: Diagnosis present

## 2012-08-18 DIAGNOSIS — D72829 Elevated white blood cell count, unspecified: Secondary | ICD-10-CM | POA: Diagnosis present

## 2012-08-18 DIAGNOSIS — N183 Chronic kidney disease, stage 3 unspecified: Secondary | ICD-10-CM | POA: Diagnosis present

## 2012-08-18 DIAGNOSIS — F0390 Unspecified dementia without behavioral disturbance: Secondary | ICD-10-CM | POA: Diagnosis present

## 2012-08-18 DIAGNOSIS — B965 Pseudomonas (aeruginosa) (mallei) (pseudomallei) as the cause of diseases classified elsewhere: Secondary | ICD-10-CM | POA: Diagnosis present

## 2012-08-18 DIAGNOSIS — A419 Sepsis, unspecified organism: Principal | ICD-10-CM | POA: Diagnosis present

## 2012-08-18 DIAGNOSIS — D649 Anemia, unspecified: Secondary | ICD-10-CM

## 2012-08-18 DIAGNOSIS — K311 Adult hypertrophic pyloric stenosis: Secondary | ICD-10-CM | POA: Diagnosis present

## 2012-08-18 LAB — URINALYSIS, ROUTINE W REFLEX MICROSCOPIC
Nitrite: POSITIVE — AB
Protein, ur: >300 mg/dL — AB
Urobilinogen, UA: 0.2 mg/dL (ref 0.0–1.0)

## 2012-08-18 LAB — POCT I-STAT, CHEM 8
Hemoglobin: 11.2 g/dL — ABNORMAL LOW (ref 13.0–17.0)
Sodium: 140 mEq/L (ref 135–145)
TCO2: 24 mmol/L (ref 0–100)

## 2012-08-18 LAB — CBC
MCV: 89.6 fL (ref 78.0–100.0)
Platelets: 308 10*3/uL (ref 150–400)
RDW: 17.4 % — ABNORMAL HIGH (ref 11.5–15.5)
WBC: 14.9 10*3/uL — ABNORMAL HIGH (ref 4.0–10.5)

## 2012-08-18 LAB — MRSA PCR SCREENING: MRSA by PCR: POSITIVE — AB

## 2012-08-18 LAB — URINE MICROSCOPIC-ADD ON

## 2012-08-18 LAB — CG4 I-STAT (LACTIC ACID): Lactic Acid, Venous: 5.5 mmol/L — ABNORMAL HIGH (ref 0.5–2.2)

## 2012-08-18 MED ORDER — PRAMOXINE-ZINC OXIDE IN MO 1-12.5 % RE OINT
TOPICAL_OINTMENT | RECTAL | Status: DC | PRN
  Filled 2012-08-18: qty 28.3

## 2012-08-18 MED ORDER — ALBUTEROL SULFATE (5 MG/ML) 0.5% IN NEBU: 2.5000 mg | INHALATION_SOLUTION | RESPIRATORY_TRACT | Status: DC | PRN

## 2012-08-18 MED ORDER — ONDANSETRON HCL 4 MG PO TABS: 4.0000 mg | ORAL_TABLET | Freq: Four times a day (QID) | ORAL | Status: DC | PRN

## 2012-08-18 MED ORDER — ALPRAZOLAM 0.5 MG PO TABS
0.5000 mg | ORAL_TABLET | Freq: Two times a day (BID) | ORAL | Status: DC
  Administered 2012-08-19 – 2012-08-22 (×6): 0.5 mg via ORAL
  Filled 2012-08-18 (×5): qty 1

## 2012-08-18 MED ORDER — ACETAMINOPHEN 650 MG RE SUPP
650.0000 mg | Freq: Once | RECTAL | Status: AC
  Administered 2012-08-18: 650 mg via RECTAL
  Filled 2012-08-18: qty 1

## 2012-08-18 MED ORDER — FERROUS SULFATE 325 (65 FE) MG PO TABS
325.0000 mg | ORAL_TABLET | Freq: Three times a day (TID) | ORAL | Status: DC
  Administered 2012-08-19 – 2012-08-22 (×10): 325 mg via ORAL
  Filled 2012-08-18 (×14): qty 1

## 2012-08-18 MED ORDER — SENNA-DOCUSATE SODIUM 8.6-50 MG PO TABS
1.0000 | ORAL_TABLET | Freq: Two times a day (BID) | ORAL | Status: DC
  Administered 2012-08-19 – 2012-08-20 (×2): 1 via ORAL
  Filled 2012-08-18 (×9): qty 1

## 2012-08-18 MED ORDER — PIPERACILLIN-TAZOBACTAM 3.375 G IVPB
3.3750 g | Freq: Three times a day (TID) | INTRAVENOUS | Status: DC
  Administered 2012-08-18 – 2012-08-22 (×12): 3.375 g via INTRAVENOUS
  Filled 2012-08-18 (×16): qty 50

## 2012-08-18 MED ORDER — SERTRALINE HCL 50 MG PO TABS
150.0000 mg | ORAL_TABLET | Freq: Every day | ORAL | Status: DC
  Administered 2012-08-19 – 2012-08-22 (×4): 150 mg via ORAL
  Filled 2012-08-18 (×5): qty 1

## 2012-08-18 MED ORDER — GUAIFENESIN-DM 100-10 MG/5ML PO SYRP
5.0000 mL | ORAL_SOLUTION | ORAL | Status: DC | PRN
  Filled 2012-08-18: qty 5

## 2012-08-18 MED ORDER — VANCOMYCIN HCL IN DEXTROSE 1-5 GM/200ML-% IV SOLN
1000.0000 mg | INTRAVENOUS | Status: DC
  Administered 2012-08-19: 1000 mg via INTRAVENOUS
  Filled 2012-08-18: qty 200

## 2012-08-18 MED ORDER — DILTIAZEM HCL ER COATED BEADS 120 MG PO CP24
120.0000 mg | ORAL_CAPSULE | Freq: Every day | ORAL | Status: DC
  Administered 2012-08-19 – 2012-08-22 (×4): 120 mg via ORAL
  Filled 2012-08-18 (×5): qty 1

## 2012-08-18 MED ORDER — ACETAMINOPHEN 650 MG RE SUPP
650.0000 mg | Freq: Four times a day (QID) | RECTAL | Status: DC | PRN
  Administered 2012-08-19: 650 mg via RECTAL
  Filled 2012-08-18: qty 1

## 2012-08-18 MED ORDER — SODIUM CHLORIDE 0.9 % IJ SOLN
3.0000 mL | Freq: Two times a day (BID) | INTRAMUSCULAR | Status: DC
  Administered 2012-08-18 – 2012-08-21 (×4): 3 mL via INTRAVENOUS

## 2012-08-18 MED ORDER — HYDROCODONE-ACETAMINOPHEN 5-325 MG PO TABS
1.0000 | ORAL_TABLET | Freq: Four times a day (QID) | ORAL | Status: DC | PRN
  Administered 2012-08-20 – 2012-08-22 (×4): 1 via ORAL
  Filled 2012-08-18 (×4): qty 1

## 2012-08-18 MED ORDER — SODIUM CHLORIDE 0.9 % IV BOLUS (SEPSIS)
1000.0000 mL | Freq: Once | INTRAVENOUS | Status: AC
  Administered 2012-08-18: 1000 mL via INTRAVENOUS

## 2012-08-18 MED ORDER — SODIUM CHLORIDE 0.9 % IV SOLN
INTRAVENOUS | Status: AC
  Administered 2012-08-18: 18:00:00 via INTRAVENOUS

## 2012-08-18 MED ORDER — DIPHENHYDRAMINE HCL 25 MG PO TABS
25.0000 mg | ORAL_TABLET | Freq: Four times a day (QID) | ORAL | Status: DC | PRN
  Filled 2012-08-18: qty 1

## 2012-08-18 MED ORDER — VANCOMYCIN HCL IN DEXTROSE 1-5 GM/200ML-% IV SOLN
1000.0000 mg | Freq: Once | INTRAVENOUS | Status: AC
  Administered 2012-08-18: 1000 mg via INTRAVENOUS
  Filled 2012-08-18: qty 200

## 2012-08-18 MED ORDER — AVEENO MOISTURIZING EX BAR: 1.0000 | CHEWABLE_BAR | CUTANEOUS | Status: DC

## 2012-08-18 MED ORDER — PIPERACILLIN-TAZOBACTAM 3.375 G IVPB 30 MIN: 3.3750 g | Freq: Once | INTRAVENOUS | Status: DC

## 2012-08-18 MED ORDER — PRAMOXINE HCL-ZINC OXIDE 1-5 % RE CREA: 1.0000 "application " | TOPICAL_CREAM | RECTAL | Status: DC | PRN

## 2012-08-18 MED ORDER — ONDANSETRON HCL 4 MG/2ML IJ SOLN
4.0000 mg | Freq: Four times a day (QID) | INTRAMUSCULAR | Status: DC | PRN
  Administered 2012-08-20: 4 mg via INTRAVENOUS
  Filled 2012-08-18: qty 2

## 2012-08-18 MED ORDER — ALPRAZOLAM 0.5 MG PO TABS
0.5000 mg | ORAL_TABLET | ORAL | Status: DC | PRN
  Administered 2012-08-20: 0.5 mg via ORAL
  Filled 2012-08-18 (×2): qty 1

## 2012-08-18 MED ORDER — POLYETHYLENE GLYCOL 3350 17 G PO PACK
17.0000 g | PACK | Freq: Every day | ORAL | Status: DC | PRN
  Filled 2012-08-18: qty 1

## 2012-08-18 MED ORDER — PIPERACILLIN-TAZOBACTAM 3.375 G IVPB 30 MIN
3.3750 g | Freq: Once | INTRAVENOUS | Status: AC
  Administered 2012-08-18: 3.375 g via INTRAVENOUS
  Filled 2012-08-18: qty 50

## 2012-08-18 NOTE — Consult Note (Signed)
Urology Consult  Referring physician: Dr. Susa Raring Reason for referral: Hematuria and possible clot retention  Chief Complaint: Hematuria and possible clot retention  History of Present Illness:[name] with foley and possible clot retention; do not resuscitate; hx of CRI/BPH/UTI, etc; NO GU surgery; ? Past bladder mass; being admitted for possible urosepsis CT scan July 3013: thickened bladder wall and mult tics and bilateral hydro with partial duplication Modifying factors: There are no other modifying factors  Associated signs and symptoms: There are no other associated signs and symptoms Aggravating and relieving factors: There are no other aggravating or relieving factors Severity: Moderate Duration: Persistent  Past Medical History  Diagnosis Date  . Renal disorder   . Chronic renal failure   . BPH (benign prostatic hyperplasia)   . Seizure   . Hypertension   . Depression   . MGUS (monoclonal gammopathy of unknown significance)   . Anxiety   . PTSD (post-traumatic stress disorder)   . Diverticulitis   . Anemia   . Pneumonia   . Urinary tract infection   . Bacteremia   . Chronic indwelling foley catheter    Past Surgical History  Procedure Laterality Date  . Tonsillectomy      Medications: I have reviewed the patient's current medications. Allergies:  Allergies  Allergen Reactions  . Morphine And Related Rash    No family history on file. Social History:  reports that he has been smoking.  He does not have any smokeless tobacco history on file. He reports that he does not drink alcohol or use illicit drugs.  ROS: All systems are reviewed and negative except as noted. Rest negative; limited by historian  Physical Exam:  Vital signs in last 24 hours: Temp:  [101.3 F (38.5 C)-103.7 F (39.8 C)] 101.3 F (38.5 C) (02/12 1148) Pulse Rate:  [71-149] 102 (02/12 1445) Resp:  [18-35] 28 (02/12 1445) BP: (95-142)/(37-102) 122/59 mmHg (02/12  1445) SpO2:  [89 %-100 %] 96 % (02/12 1445)  Cardiovascular: Skin warm; not flushed Respiratory: Breaths quiet; no shortness of breath Abdomen: No masses Neurological: Normal sensation to touch Musculoskeletal: Normal motor function arms and legs Lymphatics: No inguinal adenopathy Skin: No rashes Genitourinary:blood in bag somewhat red; catheter may be outside the bladder; not overdistended/ #18 Fr  Laboratory Data:  Results for orders placed during the hospital encounter of 08/18/12 (from the past 72 hour(s))  CBC     Status: Abnormal   Collection Time    08/18/12 10:20 AM      Result Value Range   WBC 14.9 (*) 4.0 - 10.5 K/uL   RBC 3.57 (*) 4.22 - 5.81 MIL/uL   Hemoglobin 10.1 (*) 13.0 - 17.0 g/dL   HCT 40.9 (*) 81.1 - 91.4 %   MCV 89.6  78.0 - 100.0 fL   MCH 28.3  26.0 - 34.0 pg   MCHC 31.6  30.0 - 36.0 g/dL   RDW 78.2 (*) 95.6 - 21.3 %   Platelets 308  150 - 400 K/uL  PROTIME-INR     Status: None   Collection Time    08/18/12 10:20 AM      Result Value Range   Prothrombin Time 13.2  11.6 - 15.2 seconds   INR 1.01  0.00 - 1.49  URINALYSIS, ROUTINE W REFLEX MICROSCOPIC     Status: Abnormal   Collection Time    08/18/12 10:31 AM      Result Value Range   Color, Urine RED (*) YELLOW  Comment: BIOCHEMICALS MAY BE AFFECTED BY COLOR   APPearance CLOUDY (*) CLEAR   Specific Gravity, Urine 1.016  1.005 - 1.030   pH 7.0  5.0 - 8.0   Glucose, UA NEGATIVE  NEGATIVE mg/dL   Hgb urine dipstick LARGE (*) NEGATIVE   Bilirubin Urine NEGATIVE  NEGATIVE   Ketones, ur NEGATIVE  NEGATIVE mg/dL   Protein, ur >161 (*) NEGATIVE mg/dL   Urobilinogen, UA 0.2  0.0 - 1.0 mg/dL   Nitrite POSITIVE (*) NEGATIVE   Leukocytes, UA MODERATE (*) NEGATIVE  URINE MICROSCOPIC-ADD ON     Status: Abnormal   Collection Time    08/18/12 10:31 AM      Result Value Range   Squamous Epithelial / LPF RARE  RARE   WBC, UA 11-20  <3 WBC/hpf   RBC / HPF TOO NUMEROUS TO COUNT  <3 RBC/hpf   Bacteria, UA  MANY (*) RARE   Urine-Other URINALYSIS PERFORMED ON SUPERNATANT    CG4 I-STAT (LACTIC ACID)     Status: Abnormal   Collection Time    08/18/12 10:50 AM      Result Value Range   Lactic Acid, Venous 5.50 (*) 0.5 - 2.2 mmol/L  POCT I-STAT, CHEM 8     Status: Abnormal   Collection Time    08/18/12 10:51 AM      Result Value Range   Sodium 140  135 - 145 mEq/L   Potassium 3.7  3.5 - 5.1 mEq/L   Chloride 104  96 - 112 mEq/L   BUN 16  6 - 23 mg/dL   Creatinine, Ser 0.96 (*) 0.50 - 1.35 mg/dL   Glucose, Bld 045 (*) 70 - 99 mg/dL   Calcium, Ion 4.09  8.11 - 1.30 mmol/L   TCO2 24  0 - 100 mmol/L   Hemoglobin 11.2 (*) 13.0 - 17.0 g/dL   HCT 91.4 (*) 78.2 - 95.6 %   No results found for this or any previous visit (from the past 240 hour(s)). Creatinine:  Recent Labs  08/18/12 1051  CREATININE 1.40*    Xrays: See report/chart Bilateral hydro on u/sound and possible bladder mass  Impression/Assessment:  Spoke with radiology- no clots in bladder and no foley; DX- catheter blown up in prostatic urethra with blood Possible bladder mass- no work up with age etc BPH large   Plan:  18 Fr coude went in easily with some resistance at prostate Urine clear Recommend possible mitten to restrain from pulling  velcro leg strap See PRN Send urine for c/s  Jack Stevens A 08/18/2012, 3:01 PM

## 2012-08-18 NOTE — Progress Notes (Addendum)
ANTIBIOTIC CONSULT NOTE - INITIAL  Pharmacy Consult for zosyn/vancomcyin Indication: urosepsis  Allergies  Allergen Reactions  . Morphine And Related Rash    Patient Measurements: Wt= 73kg    Vital Signs: Temp: 101.3 F (38.5 C) (02/12 1148) Temp src: Axillary (02/12 1148) BP: 106/43 mmHg (02/12 1248) Pulse Rate: 113 (02/12 1248) Intake/Output from previous day:   Intake/Output from this shift:    Labs:  Recent Labs  08/18/12 1020 08/18/12 1051  WBC 14.9*  --   HGB 10.1* 11.2*  PLT 308  --   CREATININE  --  1.40*   The CrCl is unknown because both a height and weight (above a minimum accepted value) are required for this calculation. No results found for this basename: VANCOTROUGH, VANCOPEAK, VANCORANDOM, GENTTROUGH, GENTPEAK, GENTRANDOM, TOBRATROUGH, TOBRAPEAK, TOBRARND, AMIKACINPEAK, AMIKACINTROU, AMIKACIN,  in the last 72 hours    Medical History: Past Medical History  Diagnosis Date  . Renal disorder   . Chronic renal failure   . BPH (benign prostatic hyperplasia)   . Seizure   . Hypertension   . Depression   . MGUS (monoclonal gammopathy of unknown significance)   . Anxiety   . PTSD (post-traumatic stress disorder)   . Diverticulitis   . Anemia   . Pneumonia   . Urinary tract infection   . Bacteremia   . Chronic indwelling foley catheter     Assessment: 79 yo male here with AMS and suspected sepsis to start zosyn and vancomycin (zosyn in ED at 11:30am and 1g vancomycin at 12:30). SCr=1.4 and CrCl~ 35. Tmax 103.7 and wbc 14.9. Noted patient was recently treated for a pseudomonas UTI with cefepime for 10 days. His urine culture from 12/26 grew pseudomonas intermediate to cefepime, resistant to cipro, and sensitive to zosyn, primaxin, ceftazidime.   2/12 zosyn >> 2/12 vanc >>  2/12 blood cx >>sent 2/12 urine cx >>sent   Goal of Therapy:  Vancomycin trough level 15-20 mcg/ml  Plan:  -Continue Zosyn 3.375gm IV q8h -Begin vancomycin 1000mg  IV  q24h starting tomorrow  -Will follow cultures, renal function, and trough when appropriate   Thank you,  Brett Fairy, PharmD, BCPS 08/18/2012 3:35 PM

## 2012-08-18 NOTE — Progress Notes (Signed)
Pt admitted to 3300 from ED. Oriented to unit and room. Safety discussed and call bell with in reach. VSS. Pt has 91fr foley catheter in place with bloody urine and leaking from site. Pt is arrousable and confused. Green mittens placed and bed alarm set. Will continue to monitor.  Elijah Birk, RN

## 2012-08-18 NOTE — ED Notes (Signed)
Per [facility], patient sent to ED for blood in his foley bag.  Patient baseline 89% on 2L from nursing home.   Patient non verbal.

## 2012-08-18 NOTE — H&P (Signed)
[facility]                                                                                    Patient Demographics  Jack Stevens, is a 79 y.o. male  CSN: [id number]  MRN: [id number]  DOB - 1921/09/30  Admit Date - 08/18/2012  Outpatient Primary MD for the patient is Oneal Grout, MD   With History of -  Past Medical History  Diagnosis Date  . Renal disorder   . Chronic renal failure   . BPH (benign prostatic hyperplasia)   . Seizure   . Hypertension   . Depression   . MGUS (monoclonal gammopathy of unknown significance)   . Anxiety   . PTSD (post-traumatic stress disorder)   . Diverticulitis   . Anemia   . Pneumonia   . Urinary tract infection   . Bacteremia   . Chronic indwelling foley catheter       Past Surgical History  Procedure Laterality Date  . Tonsillectomy      in for   Chief Complaint  Patient presents with  . Hematuria     HPI  Jack Stevens  is a 79 y.o. male, much of the history provided by patient's brother, patient is confused and demented unable to provide good history at this time, who lives in [facility] nursing home and has underlying history of advanced dementia, bed bound for the last several months, chronic Foley for the last several months due to urinary retention, MGUS, BPH, seizures, recurrent UTI infections in the past, she of staph aureus bacteremia in the past, questionable bladder mass per last discharge summary a few months ago. Was brought in after he started having hematuria at the nursing facility, in the ER workup was suggestive of sepsis due to UTI, physical exam was suggestive of distended urinary bladder with evidence of bladder obstruction, I was called to admit the patient for the same.    Review of Systems  patient is a unreliable historian due to his baseline dementia and now delirium due to UTI and sepsis.  He does condone to some blood in his Foley catheter, some abdominal pain, however says  no to all the other questions.   Social History History  Substance Use Topics  . Smoking status: Current Some Day Smoker  . Smokeless tobacco: Not on file  . Alcohol Use: No   quit smoking more than 10 years ago  Family History No history of prostate cancer  Prior to Admission medications   Medication Sig Start Date End Date Taking? Authorizing Provider  ALPRAZolam Prudy Feeler) 0.5 MG tablet Take 0.5 mg by mouth See admin instructions. Pt scheduled to take bid  Can have an additional dose-1 tablet prn anxiety 01/19/12  Yes Jessica U Vann, DO  Camphor-Menthol (MEN-PHOR EX) Apply 1 application topically 3 (three) times daily as needed (ITCHING).   Yes Historical Provider, MD  colloidal oatmeal (AVEENO) BAR medicated soap bar Apply 1 Bar topically See admin instructions. Use for bathing   Yes Historical Provider, MD  diltiazem (CARDIZEM CD) 120 MG 24 hr capsule Take 1 capsule (120 mg total) by mouth daily. 01/19/12 01/18/13 Yes Shanda Bumps  U Vann, DO  diphenhydrAMINE (DIPHENHIST) 25 MG tablet Take 25 mg by mouth every 6 (six) hours as needed for itching (rash/itching).   Yes Historical Provider, MD  Emollient (HYDROPHOR) OINT Apply 1 application topically daily. Apply to both feet for dry flaky skin   Yes Historical Provider, MD  ferrous sulfate 325 (65 FE) MG tablet Take 325 mg by mouth 3 (three) times daily.    Yes Historical Provider, MD  ipratropium-albuterol (DUONEB) 0.5-2.5 (3) MG/3ML SOLN Take 3 mLs by nebulization every 6 (six) hours as needed (congestion and shortnes of breath).   Yes Historical Provider, MD  polyethylene glycol (MIRALAX / GLYCOLAX) packet Take 17 g by mouth daily as needed. Constipation   Yes Historical Provider, MD  PRESCRIPTION MEDICATION Take 15 mLs by mouth See admin instructions. HYDROCODONE/APAP SOLN 7.5-325/15ML- GIVE 15 ML TID FOR CHRONIC PAIN, (DO NOT EXCEED 4 GRAMS OF TYLENOL IN 24 HOUR PERIOD)  GIVE 15 ML Q4H PRN BREAKTHROUGH PAIN   Yes Historical Provider, MD   sennosides-docusate sodium (SENOKOT-S) 8.6-50 MG tablet Take 1 tablet by mouth 2 (two) times daily.   Yes Historical Provider, MD  sertraline (ZOLOFT) 100 MG tablet Take 150 mg by mouth daily.   Yes Historical Provider, MD  bisacodyl (DULCOLAX) 10 MG suppository Place 10 mg rectally as needed. Constipation    Historical Provider, MD  Infant Care Products [facility] EX) Apply 1 application topically 3 (three) times daily. Apply to buttocks and sacrum with incontinent care    Historical Provider, MD  pramoxine-zinc (TRONOLANE) 1-5 % rectal cream Place 1 application rectally as needed. HEMORHOIDS    Historical Provider, MD    Allergies  Allergen Reactions  . Morphine And Related Rash    Physical Exam  Vitals  Blood pressure 113/52, pulse 108, temperature 101.3 F (38.5 C), temperature source Axillary, resp. rate 22, SpO2 100.00%.   1. General elderly white male lying in bed in NAD,    2. Normal affect and insight, Not Suicidal or Homicidal, Awake Alert, Oriented X 3.  3. No F.N deficits, ALL C.Nerves Intact, Strength 5/5 all 4 extremities, Sensation intact all 4 extremities, Plantars down going.  4. Ears and Eyes appear Normal, Conjunctivae clear, PERRLA. Moist Oral Mucosa.  5. Supple Neck, No JVD, No cervical lymphadenopathy appriciated, No Carotid Bruits.  6. Symmetrical Chest wall movement, Good air movement bilaterally, CTAB.  7. RRR, No Gallops, Rubs or Murmurs, No Parasternal Heave.  8. Positive Bowel Sounds, Abdomen has suprapubic distention and tenderness, Foley has grossly bloody urine, No organomegaly appriciated,No rebound -guarding or rigidity.  9.  No Cyanosis, Normal Skin Turgor, No Skin Rash or Bruise.  10. Good muscle tone,  joints appear normal , no effusions, Normal ROM.  11. No Palpable Lymph Nodes in Neck or Axillae     Data Review  CBC  Recent Labs Lab 08/18/12 1020 08/18/12 1051  WBC 14.9*  --   HGB 10.1* 11.2*  HCT 32.0* 33.0*  PLT 308  --    MCV 89.6  --   MCH 28.3  --   MCHC 31.6  --   RDW 17.4*  --    ------------------------------------------------------------------------------------------------------------------  Chemistries   Recent Labs Lab 08/18/12 1051  NA 140  K 3.7  CL 104  GLUCOSE 103*  BUN 16  CREATININE 1.40*   ------------------------------------------------------------------------------------------------------------------ CrCl is unknown because both a height and weight (above a minimum accepted value) are required for this calculation. ------------------------------------------------------------------------------------------------------------------ No results found for this basename: TSH, T4TOTAL,  Christella Hartigan, THYROIDAB,  in the last 72 hours   Coagulation profile  Recent Labs Lab 08/18/12 1020  INR 1.01   ------------------------------------------------------------------------------------------------------------------- No results found for this basename: DDIMER,  in the last 72 hours -------------------------------------------------------------------------------------------------------------------  Cardiac Enzymes No results found for this basename: CK, CKMB, TROPONINI, MYOGLOBIN,  in the last 168 hours ------------------------------------------------------------------------------------------------------------------ No components found with this basename: POCBNP,    ---------------------------------------------------------------------------------------------------------------  Urinalysis    Component Value Date/Time   COLORURINE RED* 08/18/2012 1031   APPEARANCEUR CLOUDY* 08/18/2012 1031   LABSPEC 1.016 08/18/2012 1031   PHURINE 7.0 08/18/2012 1031   GLUCOSEU NEGATIVE 08/18/2012 1031   HGBUR LARGE* 08/18/2012 1031   BILIRUBINUR NEGATIVE 08/18/2012 1031   KETONESUR NEGATIVE 08/18/2012 1031   PROTEINUR >300* 08/18/2012 1031   UROBILINOGEN 0.2 08/18/2012 1031   NITRITE POSITIVE*  08/18/2012 1031   LEUKOCYTESUR MODERATE* 08/18/2012 1031    ----------------------------------------------------------------------------------------------------------------  Imaging results:   Dg Chest Portable 1 View  08/18/2012  *RADIOLOGY REPORT*  Clinical Data: Shortness of breath.  PORTABLE CHEST - 1 VIEW  Comparison: 01/27/2012.  Findings: Heart size within normal limits.  Slightly tortuous aorta.  No segmental consolidation.  Mild central pulmonary vascular prominence without pulmonary edema.  No gross pneumothorax.  IMPRESSION: Calcified mildly tortuous aorta.  No infiltrate, congestive heart failure or gross pneumothorax.   Original Report Authenticated By: Lacy Duverney, M.D.          Assessment & Plan   1. Sepsis and leukocytosis due to UTI in a patient with history of BPH, questionable bladder mass with bladder obstruction or hydronephrosis he once ago. Now having hematuria and possible Foley obstruction due to clots.- Patient will be admitted to step down, IV fluids, blood and urine cultures, he has history of staph aureus bacteremia therefore will be placed on Zosyn along with vancomycin, have discussed the case with urologist to call Dr. Glean Hess he will see the patient shortly, for now Foley will be changed to 24 French Foley, every 2 hour Foley irrigation has been ordered, patient might need CBI.   2. Acute renal failure. Due to #1 above, relief Foley obstruction, obtain renal ultrasound, IV fluids, repeat BMP in the morning.   3. History of advanced dementia and deconditioning. Have discussed his care with patient's brother in detail bedside, medical treatment only if declines full comfort care, of note patient had been on hospice in the recent past. They do not wish any heroic measures.   4. History of dementia and anxiety continue home dose Zoloft and home dose Xanax without change.    5.HTN - On Cardizem continue, EF 60%, Last year, chronic grade 1 compensated  diastolic CHF. No mention of atrial fibrillation and previous charts.   DVT Prophylaxis SCDs   AM Labs Ordered, also please review Full Orders  Family Communication: Admission, patients condition and plan of care including tests being ordered have been discussed with the patient and BROTHER who indicate understanding and agree with the plan and Code Status.  Code Status DNR  Likely DC to  SNF  Time spent in minutes : 35  Condition GUARDED  Leroy Sea M.D on 08/18/2012 at 1:47 PM  Between 7am to 7pm - Pager - [phone]  After 7pm go to [email or web address] - password [email or web address]  And look for the night coverage person covering me after hours  [facility] Office  [phone]

## 2012-08-18 NOTE — Consult Note (Signed)
Consult note complete  Please send copy to Dr Thedore Mins

## 2012-08-18 NOTE — ED Notes (Signed)
Called [phone] to give patient report. RN couldn't take at this time.  Floor asked if I could callback in 15 minutes.

## 2012-08-18 NOTE — ED Notes (Signed)
Called [facility] to get size of foley.  Size 18 foley in patient that was placed this morning by Selena Batten, RN.

## 2012-08-18 NOTE — ED Provider Notes (Addendum)
History     CSN: [id number]  Arrival date & time 08/18/12  0865   First MD Initiated Contact with Patient 08/18/12 1014      Chief Complaint  Patient presents with  . Hematuria    Patient is a 79 y.o. male presenting with altered mental status. History provided by: EMS reported. The history is limited by the condition of the patient.  Altered Mental Status This is a new problem. Episode onset: unknown time ago. The problem occurs constantly. The problem has been gradually worsening. Nothing aggravates the symptoms. Nothing relieves the symptoms. He has tried nothing for the symptoms.  pt presents from nursing facility for altered mental status and hematuria No other details are known at arrival due to patient condition  Past Medical History  Diagnosis Date  . Renal disorder   . Chronic renal failure   . BPH (benign prostatic hyperplasia)   . Seizure   . Hypertension   . Depression   . MGUS (monoclonal gammopathy of unknown significance)   . Anxiety   . PTSD (post-traumatic stress disorder)   . Diverticulitis   . Anemia   . Pneumonia   . Urinary tract infection   . Bacteremia   . Chronic indwelling foley catheter     Past Surgical History  Procedure Laterality Date  . Tonsillectomy      No family history on file.  History  Substance Use Topics  . Smoking status: Current Some Day Smoker  . Smokeless tobacco: Not on file  . Alcohol Use: No      Review of Systems  Unable to perform ROS: Mental status change  Psychiatric/Behavioral: Positive for altered mental status.    Allergies  Morphine and related  Home Medications   Current Outpatient Rx  Name  Route  Sig  Dispense  Refill  . ALPRAZolam (XANAX) 0.5 MG tablet   Oral   Take 0.5 mg by mouth 2 (two) times daily as needed. Anxiety.         . Camphor-Menthol (MEN-PHOR EX)   Apply externally   Apply 1 application topically 3 (three) times daily as needed (ITCHING).         . colloidal oatmeal  (AVEENO) BAR medicated soap bar   Topical   Apply 1 Bar topically See admin instructions. Use for bathing         . diltiazem (CARDIZEM CD) 120 MG 24 hr capsule   Oral   Take 1 capsule (120 mg total) by mouth daily.         . diphenhydrAMINE (DIPHENHIST) 25 MG tablet   Oral   Take 25 mg by mouth every 6 (six) hours as needed for itching (rash/itching).         . Emollient (HYDROPHOR) OINT   Apply externally   Apply 1 application topically daily. Apply to both feet for dry flaky skin         . ferrous sulfate 325 (65 FE) MG tablet   Oral   Take 325 mg by mouth 3 (three) times daily.          . polyethylene glycol (MIRALAX / GLYCOLAX) packet   Oral   Take 17 g by mouth daily as needed. Constipation         . PRESCRIPTION MEDICATION   Oral   Take 15 mLs by mouth See admin instructions. HYDROCODONE/APAP SOLN 7.5-325/15ML- GIVE 15 ML TID FOR CHRONIC PAIN, (DO NOT EXCEED 4 GRAMS OF TYLENOL IN 24 HOUR PERIOD)  GIVE 15 ML Q4H PRN BREAKTHROUGH PAIN         . sennosides-docusate sodium (SENOKOT-S) 8.6-50 MG tablet   Oral   Take 1 tablet by mouth 2 (two) times daily.         . sertraline (ZOLOFT) 100 MG tablet   Oral   Take 150 mg by mouth daily.         . bisacodyl (DULCOLAX) 10 MG suppository   Rectal   Place 10 mg rectally as needed. Constipation         . cephALEXin (KEFLEX) 500 MG capsule   Oral   Take 1 capsule (500 mg total) by mouth 4 (four) times daily.   40 capsule   0   . Infant Care Products [facility] EX)   Apply externally   Apply 1 application topically 3 (three) times daily. Apply to buttocks and sacrum with incontinent care         . ondansetron (ZOFRAN-ODT) 4 MG disintegrating tablet   Oral   Take 4 mg by mouth every 6 (six) hours as needed. Nausea/vomiting.         . pramoxine-zinc (TRONOLANE) 1-5 % rectal cream   Rectal   Place 1 application rectally as needed. HEMORHOIDS           BP 112/41  Pulse 149  Temp(Src)  103.7 F (39.8 C) (Rectal)  Resp 32  SpO2 93% BP 106/43  Pulse 113  Temp(Src) 101.3 F (38.5 C) (Axillary)  Resp 25  SpO2 99%  Physical Exam CONSTITUTIONAL: ill appearing, uncomfortable HEAD AND FACE: Normocephalic/atraumatic EYES: EOMI ENMT: Mucous membranes dry NECK: supple no meningeal signs SPINE:entire spine nontender CV: tachycardia noted LUNGS: tachypnea, coarse BS noted bilaterally ABDOMEN: soft, nontender, no rebound or guarding ZO:XWRUE noted with gross blood in foley bag. No swelling or erythema noted to scrotum NEURO: Pt is somnolent but does arouse to voice and pain and moves all extremities EXTREMITIES: pulses normal,no deformity noted SKIN: warm, color normal PSYCH: somnolent  ED Course  Procedures   CRITICAL CARE Performed by: Joya Gaskins   Total critical care time: 31  Critical care time was exclusive of separately billable procedures and treating other patients.  Critical care was necessary to treat or prevent imminent or life-threatening deterioration.  Critical care was time spent personally by me on the following activities: development of treatment plan with patient and/or surrogate as well as nursing, discussions with consultants, evaluation of patient's response to treatment, examination of patient, obtaining history from patient or surrogate, ordering and performing treatments and interventions, ordering and review of laboratory studies, ordering and review of radiographic studies, pulse oximetry and re-evaluation of patient's condition.   Labs Reviewed  URINE CULTURE  CULTURE, BLOOD (ROUTINE X 2)  CULTURE, BLOOD (ROUTINE X 2)  URINALYSIS, ROUTINE W REFLEX MICROSCOPIC  CBC  PROTIME-INR  11:13 AM Pt here from nursing facility for AMS and hematuria He is very ill appearing, tachycardic and tachypneic Elevated lactate noted.  He is febrile.  Suspect sepsis Review of chart reveals he is DNR IV fluids and antibiotics ordered Will  follow closely 1:04 PM Pt improved.  He is now more awake/alert.  I advised him of need for admission.  His heart rate and temp are improved.  He is responding to IV Fluids.  Antibiotics have been ordered.   Suspect sepsis given his appearance and lactate D/w medicine, will admit to stepdown Nursing has flushed cathter and it is flowing, however he does still produce clots  MDM  Nursing notes including past medical history and social history reviewed and considered in documentation Labs/vital reviewed and considered xrays reviewed and considered        Date: 08/18/2012  Rate: 140  Rhythm: tachycardia  QRS Axis: right  Intervals: normal  ST/T Wave abnormalities: nonspecific ST changes  Conduction Disutrbances:none  Narrative Interpretation:   Old EKG Reviewed: changes noted and rate is faster today    Joya Gaskins, MD 08/18/12 1307  Joya Gaskins, MD 08/18/12 1601

## 2012-08-18 NOTE — ED Notes (Signed)
Report to Eileen Stanford, RN - waiting for urology to place a new catheter

## 2012-08-19 DIAGNOSIS — N390 Urinary tract infection, site not specified: Secondary | ICD-10-CM

## 2012-08-19 DIAGNOSIS — R7881 Bacteremia: Secondary | ICD-10-CM | POA: Diagnosis present

## 2012-08-19 DIAGNOSIS — R319 Hematuria, unspecified: Secondary | ICD-10-CM

## 2012-08-19 LAB — CBC
HCT: 22.3 % — ABNORMAL LOW (ref 39.0–52.0)
MCH: 28.3 pg (ref 26.0–34.0)
MCV: 88.8 fL (ref 78.0–100.0)
Platelets: 175 10*3/uL (ref 150–400)
RDW: 18.3 % — ABNORMAL HIGH (ref 11.5–15.5)
WBC: 17.5 10*3/uL — ABNORMAL HIGH (ref 4.0–10.5)

## 2012-08-19 LAB — BASIC METABOLIC PANEL
BUN: 24 mg/dL — ABNORMAL HIGH (ref 6–23)
CO2: 21 mEq/L (ref 19–32)
Calcium: 8 mg/dL — ABNORMAL LOW (ref 8.4–10.5)
Chloride: 109 mEq/L (ref 96–112)
Creatinine, Ser: 1.27 mg/dL (ref 0.50–1.35)
Glucose, Bld: 106 mg/dL — ABNORMAL HIGH (ref 70–99)

## 2012-08-19 MED ORDER — SODIUM CHLORIDE 0.9 % IV SOLN
INTRAVENOUS | Status: DC
  Administered 2012-08-20: 12:00:00 via INTRAVENOUS

## 2012-08-19 MED ORDER — MUPIROCIN 2 % EX OINT
1.0000 "application " | TOPICAL_OINTMENT | Freq: Two times a day (BID) | CUTANEOUS | Status: DC
  Administered 2012-08-19 – 2012-08-22 (×7): 1 via NASAL
  Filled 2012-08-19: qty 22

## 2012-08-19 MED ORDER — CHLORHEXIDINE GLUCONATE CLOTH 2 % EX PADS
6.0000 | MEDICATED_PAD | Freq: Every day | CUTANEOUS | Status: DC
  Administered 2012-08-19 – 2012-08-22 (×4): 6 via TOPICAL

## 2012-08-19 NOTE — Progress Notes (Signed)
[facility] Progress Note [place] TEAM 1 - Stepdown/ICU TEAM   Jack Stevens ZOX:[id number] DOB: 1922/04/09 DOA: 08/18/2012 PCP: Oneal Grout, MD  Brief narrative: Jack Stevens is a 79 y.o. male, much of the history provided by patient's brother, patient is confused and demented unable to provide good history at this time, who lives in [facility] nursing home and has underlying history of advanced dementia, bed bound for the last several months, chronic Foley for the last several months due to urinary retention, MGUS, BPH, seizures, recurrent UTI infections in the past, she of staph aureus bacteremia in the past, questionable bladder mass per last discharge summary a few months ago. Was brought in after he started having hematuria at the nursing facility, in the ER workup was suggestive of sepsis due to UTI, physical exam was suggestive of distended urinary bladder with evidence of bladder obstruction, I was called to admit the patient for the same.  Assessment/Plan: Active Problems:   Sepsis due to UTI in a patient with history of BPH, questionable bladder mass with bladder obstruction or hydronephrosis he once ago. Now having hematuria and possible Foley obstruction due to clots.- s/p Cudet cath placed by urology- urine has cleared    Acute on chronic kidney disease, stage 3 Likely due to outlet obstruction and improving now.     UTI/ gr neg rod bacteremia D/c Vanc, cont Zosyn, repeat bld cultures to ensure clearance.    Leukocytosis Actually worse today- will repeat in AM- if rising, will change antibiotics    BPH (benign prostatic hyperplasia) outpt f/u- per history, he has been asymptomatic  Dementia not very anxious currently    Code Status: DNR Family Communication: none today Disposition Plan: SDU  Consultants: urology  Antibiotics: Vanc 2/12- 2/13 Zosyn 2/12>>>  DVT prophylaxis: SCDs  HPI/Subjective: Alert, able to speak and answer questions  but clearly confused.    Objective: Blood pressure 117/65, pulse 84, temperature 98.3 F (36.8 C), temperature source Oral, resp. rate 21, height 5\' 11"  (1.803 m), weight 60.2 kg (132 lb 11.5 oz), SpO2 90.00%.  Intake/Output Summary (Last 24 hours) at 08/19/12 1756 Last data filed at 08/19/12 1700  Gross per 24 hour  Intake   2840 ml  Output    971 ml  Net   1869 ml     Exam: General: No acute respiratory distress Lungs: Clear to auscultation bilaterally without wheezes or crackles Cardiovascular: Regular rate and rhythm without murmur gallop or rub normal S1 and S2 Abdomen: Nontender, nondistended, soft, bowel sounds positive, no rebound, no ascites, no appreciable mass Extremities: No significant cyanosis, clubbing, or edema bilateral lower extremities  Data Reviewed: Basic Metabolic Panel:  Recent Labs Lab 08/18/12 1051 08/19/12 0340  NA 140 139  K 3.7 4.1  CL 104 109  CO2  --  21  GLUCOSE 103* 106*  BUN 16 24*  CREATININE 1.40* 1.27  CALCIUM  --  8.0*   Liver Function Tests: No results found for this basename: AST, ALT, ALKPHOS, BILITOT, PROT, ALBUMIN,  in the last 168 hours No results found for this basename: LIPASE, AMYLASE,  in the last 168 hours No results found for this basename: AMMONIA,  in the last 168 hours CBC:  Recent Labs Lab 08/18/12 1020 08/18/12 1051 08/19/12 0340  WBC 14.9*  --  17.5*  HGB 10.1* 11.2* 7.1*  HCT 32.0* 33.0* 22.3*  MCV 89.6  --  88.8  PLT 308  --  175   Cardiac Enzymes: No results  found for this basename: CKTOTAL, CKMB, CKMBINDEX, TROPONINI,  in the last 168 hours BNP (last 3 results)  Recent Labs  01/14/12 0330 01/17/12 0512 01/27/12 1620  PROBNP 8047.0* 2586.0* 3867.0*   CBG: No results found for this basename: GLUCAP,  in the last 168 hours  Recent Results (from the past 240 hour(s))  URINE CULTURE     Status: None   Collection Time    08/18/12 10:31 AM      Result Value Range Status   Specimen  Description URINE, CLEAN CATCH   Final   Special Requests Normal   Final   Culture  Setup Time 08/18/2012 15:50   Final   Colony Count >=100,000 COLONIES/ML   Final   Culture GRAM NEGATIVE RODS   Final   Report Status PENDING   Incomplete  CULTURE, BLOOD (ROUTINE X 2)     Status: None   Collection Time    08/18/12 10:40 AM      Result Value Range Status   Specimen Description BLOOD RIGHT ANTECUBITAL   Final   Special Requests BOTTLES DRAWN AEROBIC AND ANAEROBIC 10CC   Final   Culture  Setup Time 08/18/2012 15:49   Final   Culture     Final   Value: GRAM NEGATIVE RODS     Note: Gram Stain Report Called to,Read Back By and Verified With: HOLLY RN ON 3300 AT [id number] BY CASTC   Report Status PENDING   Incomplete  CULTURE, BLOOD (ROUTINE X 2)     Status: None   Collection Time    08/18/12 10:40 AM      Result Value Range Status   Specimen Description BLOOD LEFT ANTECUBITAL   Final   Special Requests BOTTLES DRAWN AEROBIC ONLY 10CC   Final   Culture  Setup Time 08/18/2012 15:49   Final   Culture     Final   Value: GRAM NEGATIVE RODS     Note: Gram Stain Report Called to,Read Back By and Verified With: HOLLY RN ON 3300 AT [id number] BY CASTC   Report Status PENDING   Incomplete  MRSA PCR SCREENING     Status: Abnormal   Collection Time    08/18/12  4:31 PM      Result Value Range Status   MRSA by PCR POSITIVE (*) NEGATIVE Final   Comment:            The GeneXpert MRSA Assay (FDA     approved for NASAL specimens     only), is one component of a     comprehensive MRSA colonization     surveillance program. It is not     intended to diagnose MRSA     infection nor to guide or     monitor treatment for     MRSA infections.     RESULT CALLED TO, READ BACK BY AND VERIFIED WITH:     RN B.MUSIAL AT 2221 BY L.PITT 08/18/12     Studies:  Recent x-ray studies have been reviewed in detail by the Attending Physician  Scheduled Meds:  Reviewed in detail by the Attending  Physician   [facility]  If 7PM-7AM, please contact night-coverage [email or web address] Password [facility] 08/19/2012, 5:56 PM   LOS: 1 day

## 2012-08-19 NOTE — Evaluation (Signed)
Clinical/Bedside Swallow Evaluation Patient Details  Name: Jack Stevens MRN: [id number] Date of Birth: January 18, 1922  Today's Date: 08/19/2012 Time: 4098-1191 SLP Time Calculation (min): 17 min  Past Medical History:  Past Medical History  Diagnosis Date  . Renal disorder   . Chronic renal failure   . BPH (benign prostatic hyperplasia)   . Seizure   . Hypertension   . Depression   . MGUS (monoclonal gammopathy of unknown significance)   . Anxiety   . PTSD (post-traumatic stress disorder)   . Diverticulitis   . Anemia   . Pneumonia   . Urinary tract infection   . Bacteremia   . Chronic indwelling foley catheter    Past Surgical History:  Past Surgical History  Procedure Laterality Date  . Tonsillectomy     HPI:  Jack Stevens  is a 79 y.o. male, much of the history provided by patient's brother, patient is confused and demented unable to provide good history at this time, who lives in [facility] nursing home and has underlying history of advanced dementia, bed bound for the last several months, chronic Foley for the last several months due to urinary retention, MGUS, BPH, seizures, recurrent UTI infections in the past, she of staph aureus bacteremia in the past, questionable bladder mass per last discharge summary a few months ago. Was brought in after he started having hematuria at the nursing facility, in the ER workup was suggestive of sepsis due to UTI, physical exam was suggestive of distended urinary bladder with evidence of bladder obstruction,   Assessment / Plan / Recommendation Clinical Impression  Pt presents with appropriate arousal for PO intake. Pt with clear CXR. Consumed solids and small SLP controlled sips of water without difficulty or signs of aspiration. With large continuous, pt directed sips there is intermittent cough/throat clear indicative of decreased airway protection. Given improved function with small sips, pts apparent tolerance of POs prior to  admit as well as history of comfort measures, will not pursue restrictive diet or further testing. Will monitor pt clinically for tolerance of current regular diet with thin liquids with precautions.     Aspiration Risk  Mild    Diet Recommendation Regular;Thin liquid   Liquid Administration via: Cup;No straw Medication Administration: Whole meds with puree Supervision: Full supervision/cueing for compensatory strategies;Patient able to self feed Compensations: Slow rate;Small sips/bites Postural Changes and/or Swallow Maneuvers: Seated upright 90 degrees    Other  Recommendations Oral Care Recommendations: Oral care BID   Follow Up Recommendations  None    Frequency and Duration min 2x/week  1 week   Pertinent Vitals/Pain NA    SLP Swallow Goals Patient will utilize recommended strategies during swallow to increase swallowing safety with: Minimal cueing;Minimal assistance Swallow Study Goal #2 - Progress: Progressing toward goal   Swallow Study Prior Functional Status       General HPI: Jack Stevens  is a 79 y.o. male, much of the history provided by patient's brother, patient is confused and demented unable to provide good history at this time, who lives in [facility] nursing home and has underlying history of advanced dementia, bed bound for the last several months, chronic Foley for the last several months due to urinary retention, MGUS, BPH, seizures, recurrent UTI infections in the past, she of staph aureus bacteremia in the past, questionable bladder mass per last discharge summary a few months ago. Was brought in after he started having hematuria at the nursing facility, in the ER workup was suggestive  of sepsis due to UTI, physical exam was suggestive of distended urinary bladder with evidence of bladder obstruction, Type of Study: Bedside swallow evaluation Diet Prior to this Study: Regular;Thin liquids Temperature Spikes Noted: No Respiratory Status: Supplemental  O2 delivered via (comment) History of Recent Intubation: No Behavior/Cognition: Alert;Cooperative Oral Cavity - Dentition: Adequate natural dentition Self-Feeding Abilities: Able to feed self Patient Positioning: Upright in bed Baseline Vocal Quality: Clear Volitional Cough: Strong Volitional Swallow: Able to elicit    Oral/Motor/Sensory Function Overall Oral Motor/Sensory Function: Appears within functional limits for tasks assessed   Ice Chips     Thin Liquid Thin Liquid: Impaired Presentation: Cup;Straw;Self Fed Pharyngeal  Phase Impairments: Cough - Immediate;Throat Clearing - Immediate (with large consecutive sips/straw sips)    Nectar Thick Nectar Thick Liquid: Not tested   Honey Thick Honey Thick Liquid: Not tested   Puree Puree: Within functional limits   Solid   GO    Solid: Within functional limits      [facility], [place] CCC-SLP [phone]  Claudine Mouton 08/19/2012,9:28 AM

## 2012-08-19 NOTE — Progress Notes (Signed)
Utilization review completed.  

## 2012-08-20 DIAGNOSIS — N390 Urinary tract infection, site not specified: Secondary | ICD-10-CM | POA: Diagnosis present

## 2012-08-20 DIAGNOSIS — Z515 Encounter for palliative care: Secondary | ICD-10-CM

## 2012-08-20 LAB — CBC
Hemoglobin: 7.6 g/dL — ABNORMAL LOW (ref 13.0–17.0)
MCH: 29 pg (ref 26.0–34.0)
MCHC: 33 g/dL (ref 30.0–36.0)
RDW: 18.1 % — ABNORMAL HIGH (ref 11.5–15.5)

## 2012-08-20 LAB — BASIC METABOLIC PANEL
BUN: 24 mg/dL — ABNORMAL HIGH (ref 6–23)
Calcium: 8.4 mg/dL (ref 8.4–10.5)
GFR calc Af Amer: 69 mL/min — ABNORMAL LOW (ref >90)
GFR calc non Af Amer: 59 mL/min — ABNORMAL LOW (ref >90)
Glucose, Bld: 81 mg/dL (ref 70–99)
Sodium: 142 mEq/L (ref 135–145)

## 2012-08-20 LAB — URINE CULTURE

## 2012-08-20 MED ORDER — POTASSIUM CHLORIDE CRYS ER 20 MEQ PO TBCR
40.0000 meq | EXTENDED_RELEASE_TABLET | ORAL | Status: AC
  Administered 2012-08-20 (×2): 40 meq via ORAL
  Filled 2012-08-20 (×2): qty 2

## 2012-08-20 MED ORDER — SODIUM CHLORIDE 0.9 % IV SOLN
INTRAVENOUS | Status: DC
  Administered 2012-08-20 – 2012-08-22 (×4): via INTRAVENOUS

## 2012-08-20 MED ORDER — HALOPERIDOL LACTATE 5 MG/ML IJ SOLN
2.0000 mg | Freq: Four times a day (QID) | INTRAMUSCULAR | Status: DC | PRN
  Administered 2012-08-21: 2 mg via INTRAVENOUS
  Filled 2012-08-20: qty 0.4

## 2012-08-20 MED ORDER — BISACODYL 10 MG RE SUPP: 10.0000 mg | Freq: Every day | RECTAL | Status: DC | PRN

## 2012-08-20 MED ORDER — BISACODYL 10 MG RE SUPP: 10.0000 mg | RECTAL | Status: DC | PRN

## 2012-08-20 MED ORDER — ENSURE COMPLETE PO LIQD
237.0000 mL | Freq: Two times a day (BID) | ORAL | Status: DC
  Administered 2012-08-20 – 2012-08-22 (×3): 237 mL via ORAL

## 2012-08-20 NOTE — Plan of Care (Signed)
Problem: Phase I Progression Outcomes Goal: Voiding-avoid urinary catheter unless indicated Outcome: Not Applicable Date Met:  08/20/12 Patient had foley pre-admisson

## 2012-08-20 NOTE — Consult Note (Addendum)
Patient ZO:XWRUE Stevens      DOB: [birth year]      AVW:[id number]     Consult Note from the Palliative Medicine Team at [facility]    Consult Requested by: Calvert Cantor     PCP: Oneal Grout, MD Reason for Consultation: Goals of care     Phone Number:[phone]  Assessment of patients Current state: 79 yr old male with h/o dementia,MGUS, BPH, seizures, chronic foley catheter, recurrent UTI's in past, who was admitted for hematuria.  Patient has questionable bladder mass, and was seen by urology, and no further work up for the bladder mass is recommended due to advanced age, dementia. Patient is currently being treated for UTI, pseudomonas bacteremia. Patient's daughter confirmed that he is DNR, she does not want artificial nutrition, also does not want repeat hospitalizations in future, unless comfort needs are not met the skilled facility.   Goals of Care: 1.  Code Status: DNR 2. Continue antibiotics as needed for recurrent for UTI 3. Do not transfer to hospital unless comfort needs are not met the skilled facility.  4. IV fluids for a defined trial  period only 5. Transfer back to [facility] with hospice 6. No peg or panda tube if he has poor po intake or develops dysphagia  2. Scope of Treatment: 1. Vital Signs: per protocol 2. Respiratory/Oxygen: Via nasal canula 3. Nutritional Support/Tube Feeds: No panda or peg tube feeding,  4. Antibiotics: Continue with antibiotics as needed 5. Blood Products: Continue with blood transfusion if needed 6. IVF: Continue as needed 7. Review of Medications to be discontinued: None 8. Labs: Continue as needed    4. Disposition: Skilled facility  with hospice   3. Symptom Management:   1. Anxiety/Agitation: Xanax prn 2. Pain: Vicodin prn 3. Bowel Regimen: Dulcolax prn 4. Delirium: haldol prn 5. Fever:  Tylenol prn 6. Nausea/Vomiting: zofran prn   4. Psychosocial: Patient resides at [facility] with hospice, his daughter and  brother are th HCPOA. Daughter makes the decisions. Patient will return to [facility] with hospice     Patient Documents Completed or Given: Document Given Completed  Advanced Directives Pkt    MOST    DNR    Gone from My Sight    Hard Choices      Brief HPI: 79 yr old male with h/o dementia,MGUS, BPH, seizures, chronic foley catheter, recurrent UTI's in past, who was admitted for hematuria.  Patient has questionable bladder mass, and was seen by urology, and no further work up for the bladder mass is recommended due to advanced age, dementia. Patient is currently being treated for UTI, pseudomonas bacteremia. Goals of care meeting done with patient's daughter on phone , discussed the code status, antibiotics use, comfort vs aggressive care.  ROS: Pain: denies Insomnia: denies Nausea: denies Dyspnea: denies Anxiety: positive as per daughter, he is on xanax   PMH:  Past Medical History  Diagnosis Date  . Renal disorder   . Chronic renal failure   . BPH (benign prostatic hyperplasia)   . Seizure   . Hypertension   . Depression   . MGUS (monoclonal gammopathy of unknown significance)   . Anxiety   . PTSD (post-traumatic stress disorder)   . Diverticulitis   . Anemia   . Pneumonia   . Urinary tract infection   . Bacteremia   . Chronic indwelling foley catheter      PSH: Past Surgical History  Procedure Laterality Date  . Tonsillectomy  I have reviewed the FH and SH and  If appropriate update it with new information. Allergies  Allergen Reactions  . Morphine And Related Rash   Scheduled Meds: . ALPRAZolam  0.5 mg Oral BID  . Chlorhexidine Gluconate Cloth  6 each Topical Q0600  . diltiazem  120 mg Oral Daily  . feeding supplement  237 mL Oral BID BM  . ferrous sulfate  325 mg Oral TID  . mupirocin ointment  1 application Nasal BID  . piperacillin-tazobactam (ZOSYN)  IV  3.375 g Intravenous Q8H  . potassium chloride  40 mEq Oral Q4H  .  sennosides-docusate sodium  1 tablet Oral BID  . sertraline  150 mg Oral Daily  . sodium chloride  3 mL Intravenous Q12H   Continuous Infusions: . sodium chloride 100 mL/hr at 08/20/12 1134   PRN Meds:.acetaminophen, albuterol, ALPRAZolam, diphenhydrAMINE, guaiFENesin-dextromethorphan, HYDROcodone-acetaminophen, ondansetron (ZOFRAN) IV, ondansetron, polyethylene glycol, pramoxine-mineral oil-zinc    BP 157/91  Pulse 96  Temp(Src) 98.5 F (36.9 C) (Oral)  Resp 26  Ht 5\' 11"  (1.803 m)  Wt 63 kg (138 lb 14.2 oz)  BMI 19.38 kg/m2  SpO2 94%   PPS: 30%   Intake/Output Summary (Last 24 hours) at 08/20/12 1436 Last data filed at 08/20/12 1200  Gross per 24 hour  Intake 2932.5 ml  Output   1326 ml  Net 1606.5 ml   Physical Exam:  General: Appears in no acute distress HEENT:  NCAT Chest:  Clear bilaterally CVS: S1s2 RRR Abdomen: Soft, nontender Ext: No edema Neuro: Alert, not oriented x 2, he lacks the decision making capacity  Labs: CBC    Component Value Date/Time   WBC 15.1* 08/20/2012 0335   RBC 2.62* 08/20/2012 0335   HGB 7.6* 08/20/2012 0335   HCT 23.0* 08/20/2012 0335   PLT 157 08/20/2012 0335   MCV 87.8 08/20/2012 0335   MCH 29.0 08/20/2012 0335   MCHC 33.0 08/20/2012 0335   RDW 18.1* 08/20/2012 0335   LYMPHSABS 1.1 07/01/2012 1615   MONOABS 0.7 07/01/2012 1615   EOSABS 0.1 07/01/2012 1615   BASOSABS 0.0 07/01/2012 1615    BMET    Component Value Date/Time   NA 142 08/20/2012 0335   K 2.9* 08/20/2012 0335   CL 113* 08/20/2012 0335   CO2 21 08/20/2012 0335   GLUCOSE 81 08/20/2012 0335   BUN 24* 08/20/2012 0335   CREATININE 1.08 08/20/2012 0335   CALCIUM 8.4 08/20/2012 0335   GFRNONAA 59* 08/20/2012 0335   GFRAA 69* 08/20/2012 0335    CMP     Component Value Date/Time   NA 142 08/20/2012 0335   K 2.9* 08/20/2012 0335   CL 113* 08/20/2012 0335   CO2 21 08/20/2012 0335   GLUCOSE 81 08/20/2012 0335   BUN 24* 08/20/2012 0335   CREATININE 1.08 08/20/2012 0335    CALCIUM 8.4 08/20/2012 0335   PROT 6.6 07/01/2012 1615   ALBUMIN 1.7* 07/01/2012 1615   AST 12 07/01/2012 1615   ALT 7 07/01/2012 1615   ALKPHOS 114 07/01/2012 1615   BILITOT 0.1* 07/01/2012 1615   GFRNONAA 59* 08/20/2012 0335   GFRAA 69* 08/20/2012 0335       Time In Time Out Total Time Spent with Patient Total Overall Time  1 30 pm  2 15 pm 30 min 60 min    Greater than 50%  of this time was spent counseling and coordinating care related to the above assessment and plan.

## 2012-08-20 NOTE — Plan of Care (Signed)
Problem: Phase I Progression Outcomes Goal: Initial discharge plan identified Outcome: Completed/Met Date Met:  08/20/12 Back to [facility] with Hospice

## 2012-08-20 NOTE — Progress Notes (Signed)
Pt in Afib. His rate did not change. His heart rate remains in the 90s-low 100s. NP notified. No new orders received. Will continue to monitor.

## 2012-08-20 NOTE — Progress Notes (Signed)
Palliative Medicine Team consult for goals of care requested by Dr Butler Denmark; patient is currently followed by [facility] -spoke with hospice liaison RN Renea Ee [phone]; she confirms patient is active with their agency and they are in full agreement with [facility]OC; patient seen at bedside pleasant, alert to self; able to carry conversation but disoriented to time, place and situation- spoke with daughter Jack Stevens, lives in [place]: [phone] she stated she and her uncle -patient's brother Jack Stevens share POA; she is primary Management consultant and   would like for PMT Dr Sharl Ma to call her approximately 2:00 pm EST to discuss goals; she did not feel the need to have Jack Stevens present for goals, as she stated she will relay decisions to her uncle- Renea Ee, RN Liaison with [facility] would like an update if possible [phone])   Above information was discussed with PMT provider, Dr Nanine Means, RN 08/20/2012, 1:05 PM Palliative Medicine Team RN Liaison [phone]

## 2012-08-20 NOTE — Progress Notes (Signed)
Speech Language Pathology Dysphagia Treatment Patient Details Name: Jack Stevens MRN: [id number] DOB: 1921-08-05 Today's Date: 08/20/2012 Time: 5366-4403 SLP Time Calculation (min): 25 min  Assessment / Plan / Recommendation Clinical Impression  Pt seen for tolerance of current diet. Pt complaining of discomfort this am, RN aware, pt already medicated. Pt is alert however and able to feed himself. At baseline, observed to have wet vocal quality, suspect pharyngeal secretions. SLP provided moderate verbal cues throughout session to take small sips, and periodically clear oral cavity of all residual prior to next sip, which he did successfully. No overt signs of decreased airway protection other than consistent wet vocal quality that appears unrelated to PO intake and does not ever clear despite efforts. Pt may continue current diet with full supervision. SLP will f/u next week for tolerance.     Diet Recommendation  Continue with Current Diet: Regular;Thin liquid    SLP Plan Continue with current plan of care   Pertinent Vitals/Pain NA   Swallowing Goals  SLP Swallowing Goals Patient will utilize recommended strategies during swallow to increase swallowing safety with: Minimal cueing;Minimal assistance Swallow Study Goal #2 - Progress: Progressing toward goal  General Temperature Spikes Noted: No Respiratory Status: Supplemental O2 delivered via (comment) Behavior/Cognition: Alert;Cooperative;Confused Oral Cavity - Dentition: Adequate natural dentition Patient Positioning: Upright in bed  Oral Cavity - Oral Hygiene Does patient have any of the following "at risk" factors?: Saliva - thick, dry mouth;Lips - dry, cracked Brush patient's teeth BID with toothbrush (using toothpaste with fluoride): Yes Patient is AT RISK - Oral Care Protocol followed (see row info): Yes   Dysphagia Treatment Treatment focused on: Skilled observation of diet tolerance;Patient/family/caregiver  education;Facilitation of oral phase;Facilitation of pharyngeal phase;Utilization of compensatory strategies Treatment Methods/Modalities: Skilled observation Patient observed directly with PO's: Yes Type of PO's observed: Dysphagia 3 (soft);Thin liquids Feeding: Able to feed self;Needs set up Liquids provided via: Cup Oral Phase Signs & Symptoms: Prolonged mastication;Prolonged bolus formation;Prolonged oral phase Pharyngeal Phase Signs & Symptoms: Wet vocal quality Type of cueing: Verbal Amount of cueing: Moderate   GO    Harlon Ditty, [place] CCC-SLP [phone]  Claudine Mouton 08/20/2012, 9:48 AM

## 2012-08-20 NOTE — Progress Notes (Addendum)
[facility] Progress Note [place] TEAM 1 - Stepdown/ICU TEAM   Adler Waites WUJ:[id number] DOB: [birth year] DOA: 08/18/2012 PCP: Oneal Grout, MD  Brief narrative: Jack Stevens is a 79 y.o. male, much of the history provided by patient's brother, patient is confused and demented unable to provide good history at this time, who lives in [facility] nursing home and has underlying history of advanced dementia, bed bound for the last several months, chronic Foley for the last several months due to urinary retention, MGUS, BPH, seizures, recurrent UTI infections in the past, she of staph aureus bacteremia in the past, questionable bladder mass per last discharge summary a few months ago. Was brought in after he started having hematuria at the nursing facility, in the ER workup was suggestive of sepsis due to UTI, physical exam was suggestive of distended urinary bladder with evidence of bladder obstruction, I was called to admit the patient for the same.  Assessment/Plan: Active Problems:   Sepsis due to UTI in a patient with history of BPH- bladder outlet obstruction with b/l hydroureteronephrosis - developed hematuria and possible Foley obstruction due to clots.- s/p Cudet cath placed by urology- urine has cleared up- will need to continue foley cath upon discharge    Acute on chronic kidney disease, stage 3 Likely due to outlet obstruction and improving now.     UTI/ pseudomonas -D/c Vanc on 2/13- cont Zosyn as it is sensitive- will need 10 days min- unfortunately is not sensitive to any PO meds- Will need PICC line placement which the family is agreeable to -grew out pseudomonas in 07/01/12 as well    Leukocytosis Not significantly improved    BPH (benign prostatic hyperplasia) outpt f/u- per history, he has been asymptomatic  Dementia not very anxious currently  Hypokalemia Replacing Recheck inAM  Anemia Noted after aggressive hydration- follow for now   Code  Status: DNR Family Communication: none today Disposition Plan: transfer to med surg- appreciate palliative care consult- pt was on hospice prior to admission-a palliative care consult  has been completed and concrete decisions have been made, including the fact that the patient will not be admitted back to the hospital if further issues were to arise (unless his comfort care needs are not met in the SNF) - Will return to SNF with PICC to treat current infection and hospice following NOTE: Dr Daune Perch note need to accompany the patient back to SNF and also needs to be faxed to the hospice service that will follow him.   Consultants: Urology Palliative care  Antibiotics: Vanc 2/12- 2/13 Zosyn 2/12>>>  DVT prophylaxis: SCDs  HPI/Subjective: Alert, no complaints - very confused but no agitation   Objective: Blood pressure 157/91, pulse 96, temperature 98.5 F (36.9 C), temperature source Oral, resp. rate 23, height 5\' 11"  (1.803 m), weight 63 kg (138 lb 14.2 oz), SpO2 94.00%.  Intake/Output Summary (Last 24 hours) at 08/20/12 1548 Last data filed at 08/20/12 1200  Gross per 24 hour  Intake 2862.5 ml  Output   1326 ml  Net 1536.5 ml     Exam: General: No acute respiratory distress Lungs: Clear to auscultation bilaterally without wheezes or crackles Cardiovascular: Regular rate and rhythm without murmur gallop or rub normal S1 and S2 Abdomen: Nontender, nondistended, soft, bowel sounds positive, no rebound, no ascites, no appreciable mass Extremities: No significant cyanosis, clubbing, or edema bilateral lower extremities  Data Reviewed: Basic Metabolic Panel:  Recent Labs Lab 08/18/12 1051 08/19/12 0340 08/20/12 0335  NA  140 139 142  K 3.7 4.1 2.9*  CL 104 109 113*  CO2  --  21 21  GLUCOSE 103* 106* 81  BUN 16 24* 24*  CREATININE 1.40* 1.27 1.08  CALCIUM  --  8.0* 8.4   Liver Function Tests: No results found for this basename: AST, ALT, ALKPHOS, BILITOT, PROT,  ALBUMIN,  in the last 168 hours No results found for this basename: LIPASE, AMYLASE,  in the last 168 hours No results found for this basename: AMMONIA,  in the last 168 hours CBC:  Recent Labs Lab 08/18/12 1020 08/18/12 1051 08/19/12 0340 08/20/12 0335  WBC 14.9*  --  17.5* 15.1*  HGB 10.1* 11.2* 7.1* 7.6*  HCT 32.0* 33.0* 22.3* 23.0*  MCV 89.6  --  88.8 87.8  PLT 308  --  175 157   Cardiac Enzymes: No results found for this basename: CKTOTAL, CKMB, CKMBINDEX, TROPONINI,  in the last 168 hours BNP (last 3 results)  Recent Labs  01/14/12 0330 01/17/12 0512 01/27/12 1620  PROBNP 8047.0* 2586.0* 3867.0*   CBG: No results found for this basename: GLUCAP,  in the last 168 hours  Recent Results (from the past 240 hour(s))  URINE CULTURE     Status: None   Collection Time    08/18/12 10:31 AM      Result Value Range Status   Specimen Description URINE, CLEAN CATCH   Final   Special Requests Normal   Final   Culture  Setup Time 08/18/2012 15:50   Final   Colony Count >=100,000 COLONIES/ML   Final   Culture PSEUDOMONAS AERUGINOSA   Final   Report Status 08/20/2012 FINAL   Final   Organism ID, Bacteria PSEUDOMONAS AERUGINOSA   Final  CULTURE, BLOOD (ROUTINE X 2)     Status: None   Collection Time    08/18/12 10:40 AM      Result Value Range Status   Specimen Description BLOOD RIGHT ANTECUBITAL   Final   Special Requests BOTTLES DRAWN AEROBIC AND ANAEROBIC 10CC   Final   Culture  Setup Time 08/18/2012 15:49   Final   Culture     Final   Value: PSEUDOMONAS AERUGINOSA     Note: Gram Stain Report Called to,Read Back By and Verified With: HOLLY RN ON 3300 AT 1122 [id number] BY CASTC   Report Status PENDING   Incomplete  CULTURE, BLOOD (ROUTINE X 2)     Status: None   Collection Time    08/18/12 10:40 AM      Result Value Range Status   Specimen Description BLOOD LEFT ANTECUBITAL   Final   Special Requests BOTTLES DRAWN AEROBIC ONLY 10CC   Final   Culture  Setup Time  08/18/2012 15:49   Final   Culture     Final   Value: PSEUDOMONAS AERUGINOSA     Note: Gram Stain Report Called to,Read Back By and Verified With: HOLLY RN ON 3300 AT [id number] BY CASTC   Report Status PENDING   Incomplete  MRSA PCR SCREENING     Status: Abnormal   Collection Time    08/18/12  4:31 PM      Result Value Range Status   MRSA by PCR POSITIVE (*) NEGATIVE Final   Comment:            The GeneXpert MRSA Assay (FDA     approved for NASAL specimens     only), is one component of a     comprehensive MRSA  colonization     surveillance program. It is not     intended to diagnose MRSA     infection nor to guide or     monitor treatment for     MRSA infections.     RESULT CALLED TO, READ BACK BY AND VERIFIED WITH:     RN B.MUSIAL AT 2221 BY L.PITT 08/18/12     Studies:  Recent x-ray studies have been reviewed in detail by the Attending Physician  Scheduled Meds:  Reviewed in detail by the Attending Physician   [facility]  If 7PM-7AM, please contact night-coverage [email or web address] Password [facility] 08/20/2012, 3:48 PM   LOS: 2 days

## 2012-08-20 NOTE — Progress Notes (Signed)
Patient transferred to 5511.  Report called to RN on 5500 and all questions answered.  Patient transferred via bed with RN. Family (Jennifer-daughter) called with new room assignment and updated on patient's condition and plan of care.

## 2012-08-20 NOTE — Progress Notes (Addendum)
Nutrition Brief Note  Patient identified on the Malnutrition Screening Tool (MST) Report.  Patient followed by [facility]    Body mass index is 19.38 kg/(m^2). Patient meets criteria for Normal based on current BMI.   Diet order is Low Sodium.  To improve quality of life, would benefit from nutrition supplement in place.  RD to order.   Consider diet liberalization to Regular.  Please consult RD as needed.  Maureen Chatters, RD, LDN Pager #: [phone] After-Hours Pager #: [phone]

## 2012-08-21 LAB — CULTURE, BLOOD (ROUTINE X 2)

## 2012-08-21 LAB — BASIC METABOLIC PANEL
BUN: 17 mg/dL (ref 6–23)
Chloride: 114 mEq/L — ABNORMAL HIGH (ref 96–112)
Glucose, Bld: 80 mg/dL (ref 70–99)
Potassium: 3.6 mEq/L (ref 3.5–5.1)

## 2012-08-21 MED ORDER — PIPERACILLIN-TAZOBACTAM 3.375 G IVPB: 3.3750 g | Freq: Three times a day (TID) | INTRAVENOUS | Status: AC

## 2012-08-21 MED ORDER — HYDRALAZINE HCL 20 MG/ML IJ SOLN
10.0000 mg | Freq: Four times a day (QID) | INTRAMUSCULAR | Status: DC | PRN
  Administered 2012-08-21: 10 mg via INTRAVENOUS
  Filled 2012-08-21 (×2): qty 0.5

## 2012-08-21 MED ORDER — ENSURE COMPLETE PO LIQD: 237.0000 mL | Freq: Two times a day (BID) | ORAL | Status: AC

## 2012-08-21 NOTE — Discharge Summary (Addendum)
Physician Discharge Summary  [name]:[id number] DOB: [birth year] DOA: 08/18/2012  PCP: Oneal Grout, MD  Admit date: 08/18/2012 Discharge date: 08/22/2012  Recommendations for Outpatient Follow-up:  1. Patient will be transferred to SNF today. 2. Pt was on hospice prior to admission-a palliative care consult was completed and concrete decisions were made, including the fact that the patient will not be admitted back to the hospital if further issues were to arise (unless his comfort care needs are not met in the SNF).  Please refer to Dr. Daune Perch palliative care consult note for plan of care regarding end of life. 3. Will return to SNF with PICC to treat current infection and hospice following a total of 10 days antibiotics(zosyn)  Discharge Diagnoses:  Sepsis  Acute on chronic kidney disease, stage 3  UTI/ pseudomonas  Leukocytosis  BPH (benign prostatic hyperplasia)   Dementia   Hypokalemia   Anemia     Discharge Condition: Guarded Disposition: Transfer to SNF  Diet recommendation: Heart healthy  Filed Weights   08/20/12 0500 08/21/12 0423 08/22/12 0533  Weight: 138 lb 14.2 oz (63 kg) 142 lb 3.2 oz (64.5 kg) 152 lb 8.9 oz (69.2 kg)    History of present illness: Jack Stevens is a 79 y.o. male, much of the history provided by patient's brother, patient is confused and demented unable to provide good history at this time, who lives in [facility] nursing home and has underlying history of advanced dementia, bed bound for the last several months, chronic Foley for the last several months due to urinary retention, MGUS, BPH, seizures, recurrent UTI infections in the past, she of staph aureus bacteremia in the past, questionable bladder mass per last discharge summary a few months ago. Was brought in after he started having hematuria at the nursing facility, in the ER workup was suggestive of sepsis due to UTI, physical exam was suggestive of distended urinary bladder with  evidence of bladder obstruction, I was called to admit the patient for the same.  Hospital Course:  Sepsis  Due to UTI in a patient with history of BPH- bladder outlet obstruction with b/l hydroureteronephrosis - developed hematuria and possible Foley obstruction due to clots.- s/p Cudet cath placed by urology- urine has cleared up- will need to continue foley cath upon discharge. Patient was discharged zosyn for a total of 10 days via PICC line.    Acute on chronic kidney disease, stage 3  Likely due to outlet obstruction and improved at discharge.   UTI/ pseudomonas  -D/c Vanc on 2/13- cont Zosyn as it is sensitive per urice sensitivity results- will need 10 days minimum-unfortunately is not sensitive to any PO meds and was started on zosyn via PICC line placement which the family was agreeable to.   Leukocytosis  Not significantly improved upon discharge but palliative care agreed to continue current treatment and not start any new interventions at this time.  BPH (benign prostatic hyperplasia)  Patient was found to have a mass in bladder but given his age it was decided not to work this up any further.  Dementia  At baseline.  Hypokalemia  Resolved at discharge  Anemia  Noted after aggressive hydration.     Discharge Instructions      Discharge Orders   Future Orders Complete By Expires     Bed rest  As directed     Diet - low sodium heart healthy  As directed     Diet - low sodium heart healthy  As directed     Discharge instructions  As directed     Comments:      Patient and his family wished not to be brought back to the hospital unless the comfort care needs cannot be met at [facility].    Increase activity slowly  As directed         Medication List    TAKE these medications       ALPRAZolam 0.5 MG tablet  Commonly known as:  XANAX  Take 0.5 mg by mouth See admin instructions. Pt scheduled to take bid   Can have an additional dose-1 tablet prn anxiety      bisacodyl 10 MG suppository  Commonly known as:  DULCOLAX  Place 10 mg rectally as needed. Constipation     colloidal oatmeal Bar medicated soap bar  Apply 1 Bar topically See admin instructions. Use for bathing     DERMACLOUD EX  Apply 1 application topically 3 (three) times daily. Apply to buttocks and sacrum with incontinent care     diltiazem 120 MG 24 hr capsule  Commonly known as:  CARDIZEM CD  Take 1 capsule (120 mg total) by mouth daily.     DIPHENHIST 25 MG tablet  Generic drug:  diphenhydrAMINE  Take 25 mg by mouth every 6 (six) hours as needed for itching (rash/itching).     feeding supplement Liqd  Take 237 mLs by mouth 2 (two) times daily between meals.     ferrous sulfate 325 (65 FE) MG tablet  Take 325 mg by mouth 3 (three) times daily.     Hydrophor Oint  Apply 1 application topically daily. Apply to both feet for dry flaky skin     ipratropium-albuterol 0.5-2.5 (3) MG/3ML Soln  Commonly known as:  DUONEB  Take 3 mLs by nebulization every 6 (six) hours as needed (congestion and shortnes of breath).     MEN-PHOR EX  Apply 1 application topically 3 (three) times daily as needed (ITCHING).     piperacillin-tazobactam 3.375 GM/50ML IVPB  Commonly known as:  ZOSYN  Inject 50 mLs (3.375 g total) into the vein every 8 (eight) hours.     polyethylene glycol packet  Commonly known as:  MIRALAX / GLYCOLAX  Take 17 g by mouth daily as needed. Constipation     pramoxine-zinc 1-5 % rectal cream  Commonly known as:  TRONOLANE  Place 1 application rectally as needed. HEMORHOIDS     PRESCRIPTION MEDICATION  Take 15 mLs by mouth See admin instructions. HYDROCODONE/APAP SOLN 7.5-325/15ML-  GIVE 15 ML TID FOR CHRONIC PAIN, (DO NOT EXCEED 4 GRAMS OF TYLENOL IN 24 HOUR PERIOD)   GIVE 15 ML Q4H PRN BREAKTHROUGH PAIN     sennosides-docusate sodium 8.6-50 MG tablet  Commonly known as:  SENOKOT-S  Take 1 tablet by mouth 2 (two) times daily.     sertraline 100 MG tablet   Commonly known as:  ZOLOFT  Take 150 mg by mouth daily.          The results of significant diagnostics from this hospitalization (including imaging, microbiology, ancillary and laboratory) are listed below for reference.    Significant Diagnostic Studies: US Renal  08/18/2012  *RADIOLOGY REPORT*  Clinical Data: Hematuria.  Urinary tract infection.  Sepsis.  RENAL/URINARY TRACT ULTRASOUND COMPLETE  Comparison:  01/14/2012.  Findings:  Right Kidney:  10.9 cm.  Hydroureteronephrosis.  Left Kidney:  12.3 cm.  Hydroureteronephrosis.  Complex 4.5 cm left upper pole cyst noted to be partially  calcified on prior CT.  Bladder:  Abnormal appearance with mass along the bladder base which may represent a lobulated prostate gland impressing upon this region.  Additionally, remainder of bladder wall is thickened, irregular with bladder diverticulum.  Mucosal abnormality not excluded.  IMPRESSION: Prominent bilateral hydroureteronephrosis.  It is possible this is related to bladder outlet obstruction as prostate gland is enlarged impressing upon the bladder base.  Prostate mass not excluded.  Clinical and laboratory correlation recommended.  Bladder wall is thickened and irregular with bladder diverticulum. Mucosal abnormality not excluded.  4.5 cm left upper pole complex renal cystic structure noted to be partially calcified on prior CT.   Original Report Authenticated By: Lacy Duverney, M.D.    Dg Chest Portable 1 View  08/18/2012  *RADIOLOGY REPORT*  Clinical Data: Shortness of breath.  PORTABLE CHEST - 1 VIEW  Comparison: 01/27/2012.  Findings: Heart size within normal limits.  Slightly tortuous aorta.  No segmental consolidation.  Mild central pulmonary vascular prominence without pulmonary edema.  No gross pneumothorax.  IMPRESSION: Calcified mildly tortuous aorta.  No infiltrate, congestive heart failure or gross pneumothorax.   Original Report Authenticated By: Lacy Duverney, M.D.      Microbiology: Recent Results (from the past 240 hour(s))  URINE CULTURE     Status: None   Collection Time    08/18/12 10:31 AM      Result Value Range Status   Specimen Description URINE, CLEAN CATCH   Final   Special Requests Normal   Final   Culture  Setup Time 08/18/2012 15:50   Final   Colony Count >=100,000 COLONIES/ML   Final   Culture PSEUDOMONAS AERUGINOSA   Final   Report Status 08/20/2012 FINAL   Final   Organism ID, Bacteria PSEUDOMONAS AERUGINOSA   Final  CULTURE, BLOOD (ROUTINE X 2)     Status: None   Collection Time    08/18/12 10:40 AM      Result Value Range Status   Specimen Description BLOOD RIGHT ANTECUBITAL   Final   Special Requests BOTTLES DRAWN AEROBIC AND ANAEROBIC 10CC   Final   Culture  Setup Time 08/18/2012 15:49   Final   Culture     Final   Value: PSEUDOMONAS AERUGINOSA     Note: SUSCEPTIBILITIES PERFORMED ON PREVIOUS CULTURE WITHIN THE LAST 5 DAYS.     Note: Gram Stain Report Called to,Read Back By and Verified With: HOLLY RN ON 3300 AT 1122 [id number] BY CASTC   Report Status 08/21/2012 FINAL   Final  CULTURE, BLOOD (ROUTINE X 2)     Status: None   Collection Time    08/18/12 10:40 AM      Result Value Range Status   Specimen Description BLOOD LEFT ANTECUBITAL   Final   Special Requests BOTTLES DRAWN AEROBIC ONLY 10CC   Final   Culture  Setup Time 08/18/2012 15:49   Final   Culture     Final   Value: PSEUDOMONAS AERUGINOSA     Note: Gram Stain Report Called to,Read Back By and Verified With: HOLLY RN ON 3300 AT 1122 [id number] BY CASTC   Report Status 08/21/2012 FINAL   Final   Organism ID, Bacteria PSEUDOMONAS AERUGINOSA   Final  MRSA PCR SCREENING     Status: Abnormal   Collection Time    08/18/12  4:31 PM      Result Value Range Status   MRSA by PCR POSITIVE (*) NEGATIVE Final   Comment:  The GeneXpert MRSA Assay (FDA     approved for NASAL specimens     only), is one component of a     comprehensive MRSA colonization      surveillance program. It is not     intended to diagnose MRSA     infection nor to guide or     monitor treatment for     MRSA infections.     RESULT CALLED TO, READ BACK BY AND VERIFIED WITH:     RN B.MUSIAL AT 2221 BY L.PITT 08/18/12     Labs: Basic Metabolic Panel:  Recent Labs Lab 08/18/12 1051 08/19/12 0340 08/20/12 0335 08/20/12 1546 08/21/12 0630  NA 140 139 142  --  142  K 3.7 4.1 2.9* 3.7 3.6  CL 104 109 113*  --  114*  CO2  --  21 21  --  20  GLUCOSE 103* 106* 81  --  80  BUN 16 24* 24*  --  17  CREATININE 1.40* 1.27 1.08  --  1.00  CALCIUM  --  8.0* 8.4  --  8.5   Liver Function Tests: No results found for this basename: AST, ALT, ALKPHOS, BILITOT, PROT, ALBUMIN,  in the last 168 hours No results found for this basename: LIPASE, AMYLASE,  in the last 168 hours No results found for this basename: AMMONIA,  in the last 168 hours CBC:  Recent Labs Lab 08/18/12 1020 08/18/12 1051 08/19/12 0340 08/20/12 0335  WBC 14.9*  --  17.5* 15.1*  HGB 10.1* 11.2* 7.1* 7.6*  HCT 32.0* 33.0* 22.3* 23.0*  MCV 89.6  --  88.8 87.8  PLT 308  --  175 157   Cardiac Enzymes: No results found for this basename: CKTOTAL, CKMB, CKMBINDEX, TROPONINI,  in the last 168 hours BNP: BNP (last 3 results)  Recent Labs  01/14/12 0330 01/17/12 0512 01/27/12 1620  PROBNP 8047.0* 2586.0* 3867.0*   CBG: No results found for this basename: GLUCAP,  in the last 168 hours  Active Problems:   Sepsis(995.91)   Acute on chronic kidney disease, stage 3   Leukocytosis   BPH (benign prostatic hyperplasia)   Hematuria   Bacteremia   Urinary tract infection- pseudomonas   Time coordinating discharge: 34 min  Signed: Lars Mage, MD [facility] 08/22/2012, 8:47 AM

## 2012-08-21 NOTE — Progress Notes (Signed)
[facility] Progress Note [place] TEAM [address] ZOX:[id number] DOB: 09-Dec-1921 DOA: 08/18/2012 PCP: Oneal Grout, MD  Brief narrative: Jack Stevens is a 79 y.o. male, much of the history provided by patient's brother, patient is confused and demented unable to provide good history at this time, who lives in [facility] nursing home and has underlying history of advanced dementia, bed bound for the last several months, chronic Foley for the last several months due to urinary retention, MGUS, BPH, seizures, recurrent UTI infections in the past, she of staph aureus bacteremia in the past, questionable bladder mass per last discharge summary a few months ago. Was brought in after he started having hematuria at the nursing facility, in the ER workup was suggestive of sepsis due to UTI, physical exam was suggestive of distended urinary bladder with evidence of bladder obstruction, I was called to admit the patient for the same.  Assessment/Plan: Active Problems:   Sepsis Due to UTI in a patient with history of BPH- bladder outlet obstruction with b/l hydroureteronephrosis - developed hematuria and possible Foley obstruction due to clots.- s/p Cudet cath placed by urology- urine has cleared up- will need to continue foley cath upon discharge. Patient will be on zosyn for a total of 10 days via PICC line.  -seems completely resolved now.    Acute on chronic kidney disease, stage 3 Likely due to outlet obstruction and improved now.     UTI/ pseudomonas -D/c Vanc on 2/13- cont Zosyn as it is sensitive- will need 10 days min- unfortunately is not sensitive to any PO meds- Will need PICC line placement which the family is agreeable to. -PICC line order today -Social work order today to help with tranfer    Leukocytosis Not significantly improved    BPH (benign prostatic hyperplasia) outpt f/u- per history, he has been asymptomatic  Dementia not very anxious  currently  Hypokalemia Resolved  Anemia Noted after aggressive hydration- follow for now   Code Status: DNR Family Communication: none today Disposition Plan: Appreciate palliative care consult- pt was on hospice prior to admission-a palliative care consult was completed and concrete decisions have been made, including the fact that the patient will not be admitted back to the hospital if further issues were to arise (unless his comfort care needs are not met in the SNF). - Will return to SNF with PICC to treat current infection and hospice following NOTE: Dr Daune Perch note need to accompany the patient back to SNF and also needs to be faxed to the hospice service that will follow him.   Consultants: Urology Palliative care  Antibiotics: Vanc 2/12- 2/13 Zosyn 2/12>>>  DVT prophylaxis: SCDs  HPI/Subjective: Alert, complains of repeated urge to urinate despite having foley in place. Hematuria noted.   Objective: Blood pressure 167/79, pulse 74, temperature 97.5 F (36.4 C), temperature source Oral, resp. rate 20, height 5\' 11"  (1.803 m), weight 142 lb 3.2 oz (64.5 kg), SpO2 96.00%.  Intake/Output Summary (Last 24 hours) at 08/21/12 0825 Last data filed at 08/21/12 0420  Gross per 24 hour  Intake 1788.75 ml  Output   1165 ml  Net 623.75 ml     Exam: General: No acute respiratory distress Lungs: Clear to auscultation bilaterally without wheezes or crackles Cardiovascular: Regular rate and rhythm without murmur gallop or rub normal S1 and S2 Abdomen: tender to palpation in lower abdomen, nondistended, soft, bowel sounds positive, no rebound, no ascites, no appreciable mass Extremities: No significant cyanosis, clubbing,  or edema bilateral lower extremities  Data Reviewed: Basic Metabolic Panel:  Recent Labs Lab 08/18/12 1051 08/19/12 0340 08/20/12 0335 08/20/12 1546 08/21/12 0630  NA 140 139 142  --  142  K 3.7 4.1 2.9* 3.7 3.6  CL 104 109 113*  --  114*  CO2   --  21 21  --  20  GLUCOSE 103* 106* 81  --  80  BUN 16 24* 24*  --  17  CREATININE 1.40* 1.27 1.08  --  1.00  CALCIUM  --  8.0* 8.4  --  8.5   CBC:  Recent Labs Lab 08/18/12 1020 08/18/12 1051 08/19/12 0340 08/20/12 0335  WBC 14.9*  --  17.5* 15.1*  HGB 10.1* 11.2* 7.1* 7.6*  HCT 32.0* 33.0* 22.3* 23.0*  MCV 89.6  --  88.8 87.8  PLT 308  --  175 157   BNP (last 3 results)  Recent Labs  01/14/12 0330 01/17/12 0512 01/27/12 1620  PROBNP 8047.0* 2586.0* 3867.0*   CBG: No results found for this basename: GLUCAP,  in the last 168 hours  Recent Results (from the past 240 hour(s))  URINE CULTURE     Status: None   Collection Time    08/18/12 10:31 AM      Result Value Range Status   Specimen Description URINE, CLEAN CATCH   Final   Special Requests Normal   Final   Culture  Setup Time 08/18/2012 15:50   Final   Colony Count >=100,000 COLONIES/ML   Final   Culture PSEUDOMONAS AERUGINOSA   Final   Report Status 08/20/2012 FINAL   Final   Organism ID, Bacteria PSEUDOMONAS AERUGINOSA   Final  CULTURE, BLOOD (ROUTINE X 2)     Status: None   Collection Time    08/18/12 10:40 AM      Result Value Range Status   Specimen Description BLOOD RIGHT ANTECUBITAL   Final   Special Requests BOTTLES DRAWN AEROBIC AND ANAEROBIC 10CC   Final   Culture  Setup Time 08/18/2012 15:49   Final   Culture     Final   Value: PSEUDOMONAS AERUGINOSA     Note: Gram Stain Report Called to,Read Back By and Verified With: HOLLY RN ON 3300 AT 1122 [id number] BY CASTC   Report Status PENDING   Incomplete  CULTURE, BLOOD (ROUTINE X 2)     Status: None   Collection Time    08/18/12 10:40 AM      Result Value Range Status   Specimen Description BLOOD LEFT ANTECUBITAL   Final   Special Requests BOTTLES DRAWN AEROBIC ONLY 10CC   Final   Culture  Setup Time 08/18/2012 15:49   Final   Culture     Final   Value: PSEUDOMONAS AERUGINOSA     Note: Gram Stain Report Called to,Read Back By and Verified With:  HOLLY RN ON 3300 AT 1122 [id number] BY CASTC   Report Status PENDING   Incomplete  MRSA PCR SCREENING     Status: Abnormal   Collection Time    08/18/12  4:31 PM      Result Value Range Status   MRSA by PCR POSITIVE (*) NEGATIVE Final   Comment:            The GeneXpert MRSA Assay (FDA     approved for NASAL specimens     only), is one component of a     comprehensive MRSA colonization     surveillance program. It is  not     intended to diagnose MRSA     infection nor to guide or     monitor treatment for     MRSA infections.     RESULT CALLED TO, READ BACK BY AND VERIFIED WITH:     RN B.MUSIAL AT 2221 BY L.PITT 08/18/12     Studies:  Recent x-ray studies have been reviewed in detail by the Attending Physician  Scheduled Meds:  Reviewed in detail by the Attending Physician   Lars Mage  If 7PM-7AM, please contact night-coverage [email or web address] Password [email or web address] 08/21/2012, 8:25 AM   LOS: 3 days

## 2012-08-22 ENCOUNTER — Inpatient Hospital Stay (HOSPITAL_COMMUNITY)

## 2012-08-22 MED ORDER — SODIUM CHLORIDE 0.9 % IJ SOLN: 10.0000 mL | INTRAMUSCULAR | Status: DC | PRN

## 2012-08-22 MED ORDER — SODIUM CHLORIDE 0.9 % IJ SOLN: 10.0000 mL | Freq: Two times a day (BID) | INTRAMUSCULAR | Status: DC

## 2012-08-22 NOTE — Clinical Social Work Note (Signed)
Patient to return to [facility] today via [facility]. Patient and facility aware of transfer. Discharge packet complete. CSW signing off.  Ricke Hey, [place] [phone] (weekend)

## 2012-08-22 NOTE — Progress Notes (Signed)
Jack Stevens to be D/C'd Skilled nursing facility per MD order.  Discussed with the patient and all questions fully answered.    Medication List    TAKE these medications       ALPRAZolam 0.5 MG tablet  Commonly known as:  XANAX  Take 0.5 mg by mouth See admin instructions. Pt scheduled to take bid   Can have an additional dose-1 tablet prn anxiety     bisacodyl 10 MG suppository  Commonly known as:  DULCOLAX  Place 10 mg rectally as needed. Constipation     colloidal oatmeal Bar medicated soap bar  Apply 1 Bar topically See admin instructions. Use for bathing     DERMACLOUD EX  Apply 1 application topically 3 (three) times daily. Apply to buttocks and sacrum with incontinent care     diltiazem 120 MG 24 hr capsule  Commonly known as:  CARDIZEM CD  Take 1 capsule (120 mg total) by mouth daily.     DIPHENHIST 25 MG tablet  Generic drug:  diphenhydrAMINE  Take 25 mg by mouth every 6 (six) hours as needed for itching (rash/itching).     feeding supplement Liqd  Take 237 mLs by mouth 2 (two) times daily between meals.     ferrous sulfate 325 (65 FE) MG tablet  Take 325 mg by mouth 3 (three) times daily.     Hydrophor Oint  Apply 1 application topically daily. Apply to both feet for dry flaky skin     ipratropium-albuterol 0.5-2.5 (3) MG/3ML Soln  Commonly known as:  DUONEB  Take 3 mLs by nebulization every 6 (six) hours as needed (congestion and shortnes of breath).     MEN-PHOR EX  Apply 1 application topically 3 (three) times daily as needed (ITCHING).     piperacillin-tazobactam 3.375 GM/50ML IVPB  Commonly known as:  ZOSYN  Inject 50 mLs (3.375 g total) into the vein every 8 (eight) hours.     polyethylene glycol packet  Commonly known as:  MIRALAX / GLYCOLAX  Take 17 g by mouth daily as needed. Constipation     pramoxine-zinc 1-5 % rectal cream  Commonly known as:  TRONOLANE  Place 1 application rectally as needed. HEMORHOIDS     PRESCRIPTION MEDICATION   Take 15 mLs by mouth See admin instructions. HYDROCODONE/APAP SOLN 7.5-325/15ML-  GIVE 15 ML TID FOR CHRONIC PAIN, (DO NOT EXCEED 4 GRAMS OF TYLENOL IN 24 HOUR PERIOD)   GIVE 15 ML Q4H PRN BREAKTHROUGH PAIN     sennosides-docusate sodium 8.6-50 MG tablet  Commonly known as:  SENOKOT-S  Take 1 tablet by mouth 2 (two) times daily.     sertraline 100 MG tablet  Commonly known as:  ZOLOFT  Take 150 mg by mouth daily.        VVS, Skin clean,  IV catheter discontinued intact. . Dressing and pressure applied. Pt went to facility with a left upper arm PICC and chronic foley.Pt had excoriated buttocks area due to frequent stools and a skin tear on right hand at knuckle when IV was removed from hand. Foam dressing covering skin tear.  An After Visit Summary was printed and given to [facility]. Report given to nurse at [facility] and brother Jack Stevens called to report discharge. Pt taken on stretcher by [facility] to [facility]          Jack Eves, RN 08/22/2012 3:14 PM

## 2012-10-05 DEATH — deceased

## 2014-04-23 IMAGING — CT CT HEAD WITHOUT CONTRAST
2 series · 16 of 30 positions shown, 20 images · non-contrast
Comparison: none

REASON FOR EXAM: ams
COMMENTS:

[Series 2: without · axial · non-contrast · 0.45mm/px · z∈[-54,+76]mm · 13 of 32 slices shown, 17 images]
[im 3/32  brain]
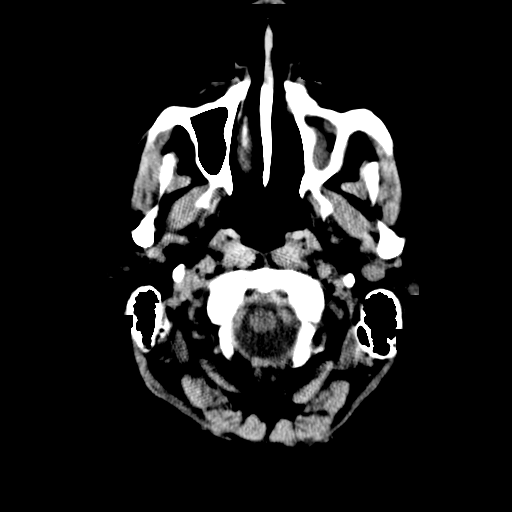
[im 3/32  bone]
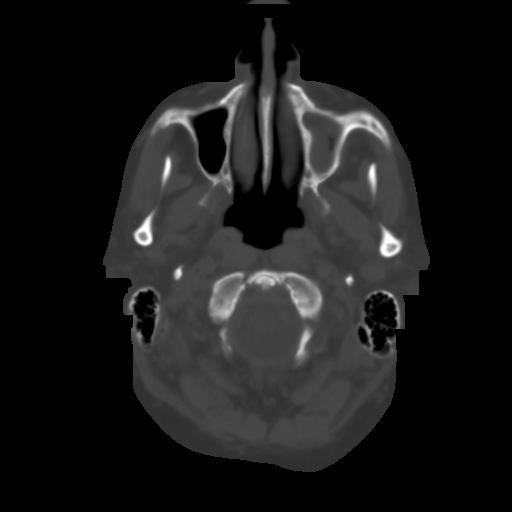
[im 5/32  brain]
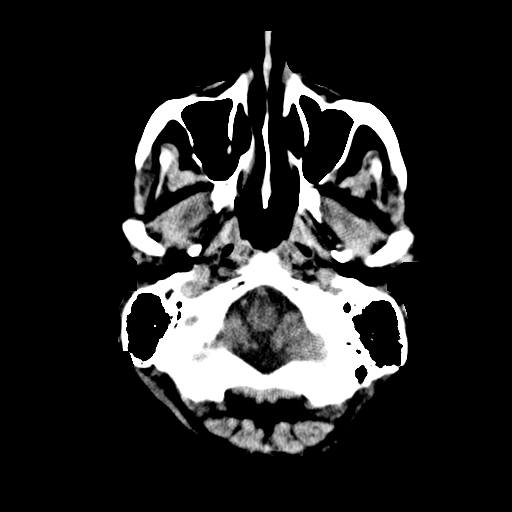
[im 7/32  brain]
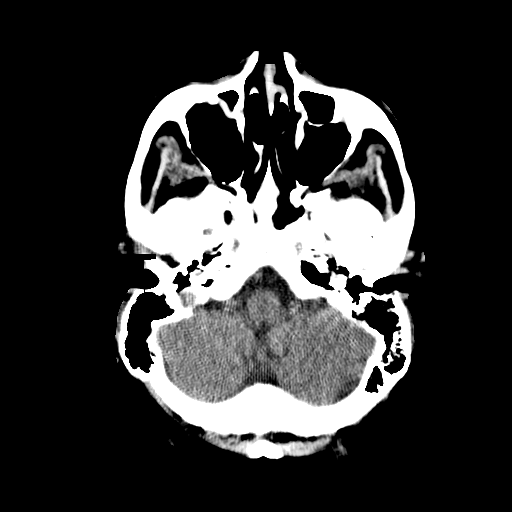
[im 9/32  brain]
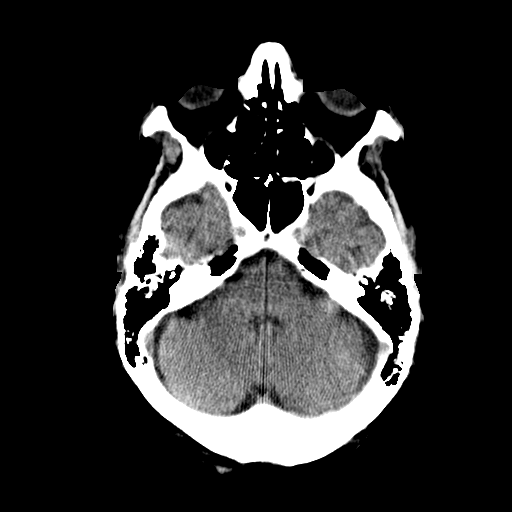
[im 12/32  brain]
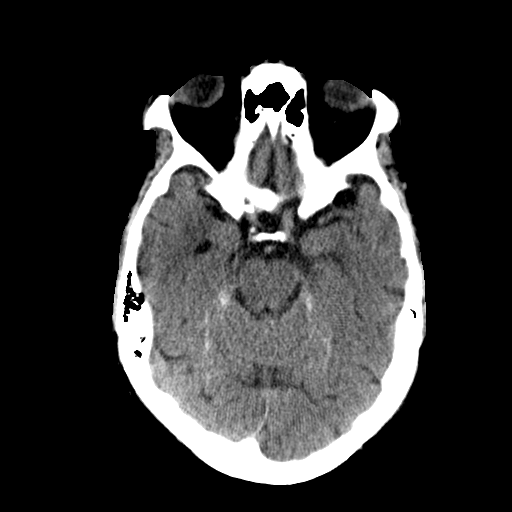
[im 12/32  bone]
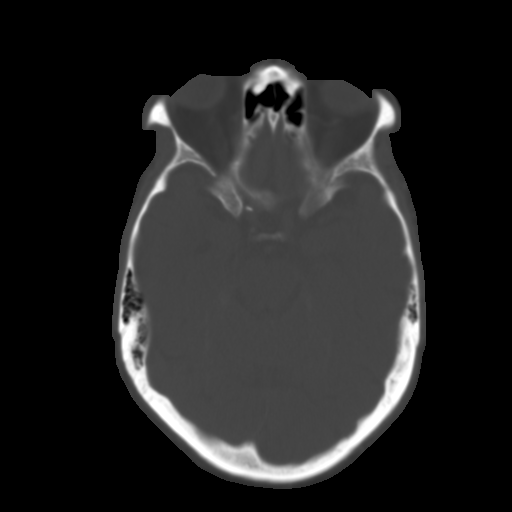
[im 14/32  brain]
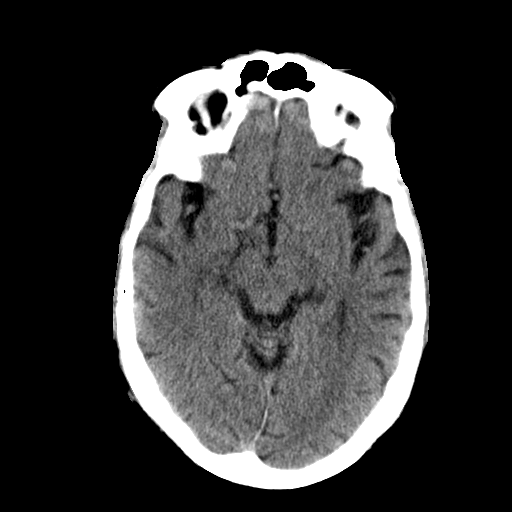
[im 16/32  brain]
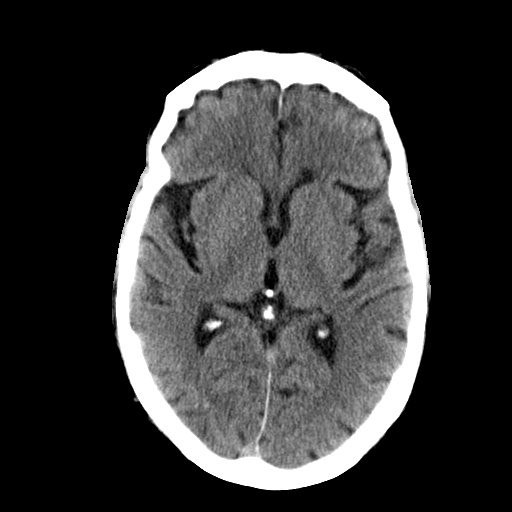
[im 18/32  brain]
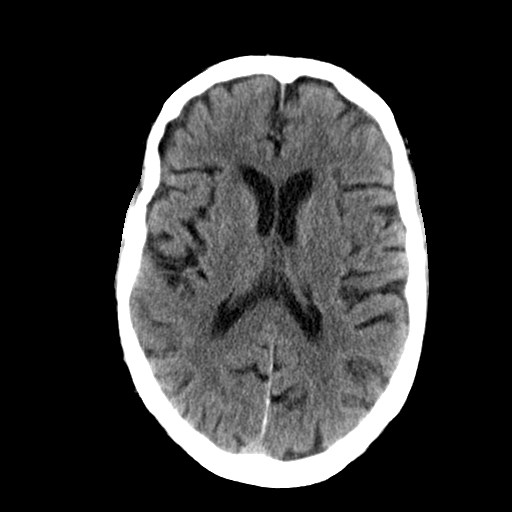
[im 20/32  brain]
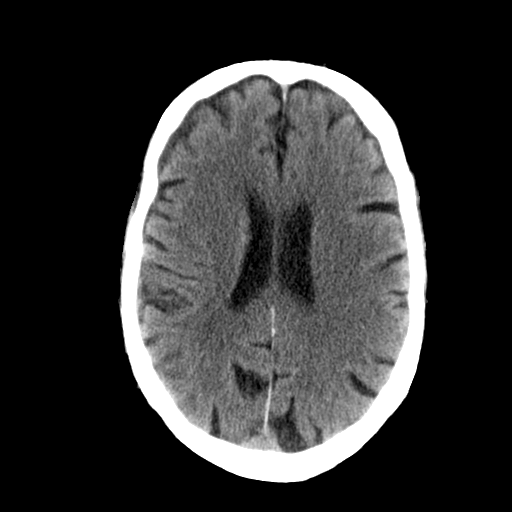
[im 20/32  bone]
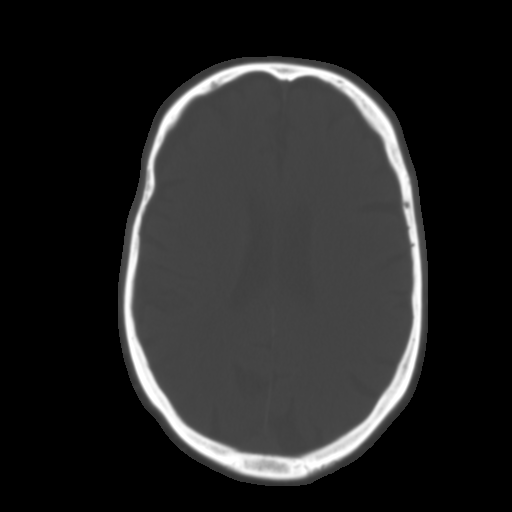
[im 23/32  brain]
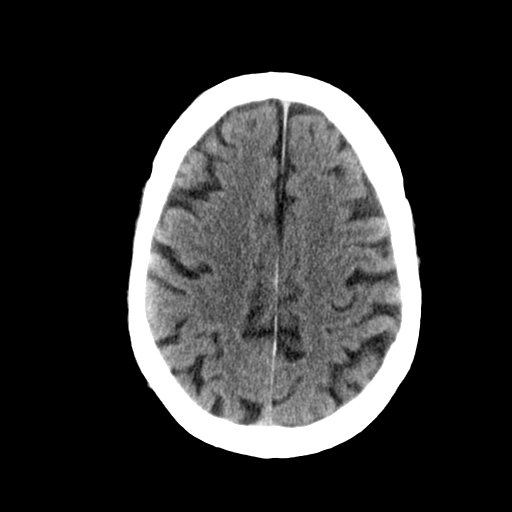
[im 25/32  brain]
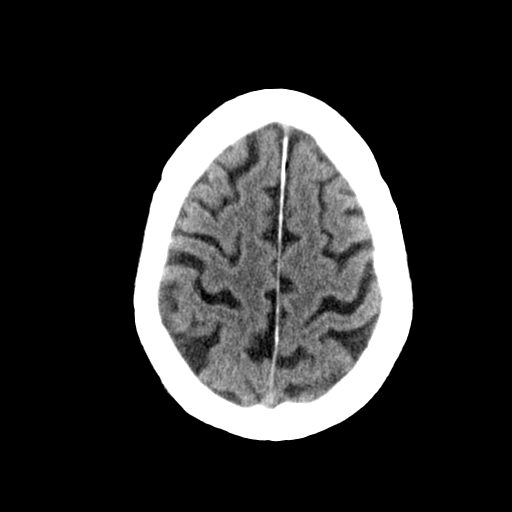
[im 27/32  brain]
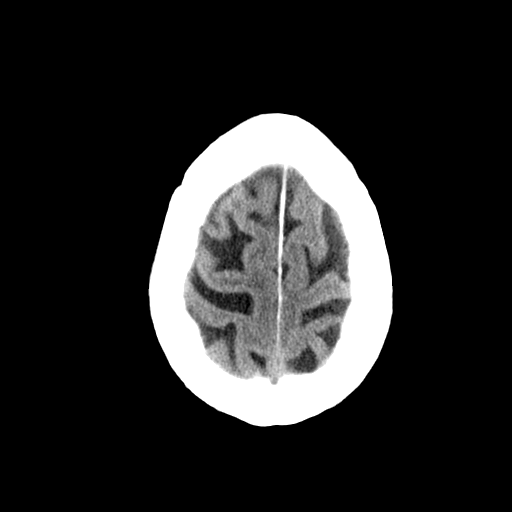
[im 29/32  brain]
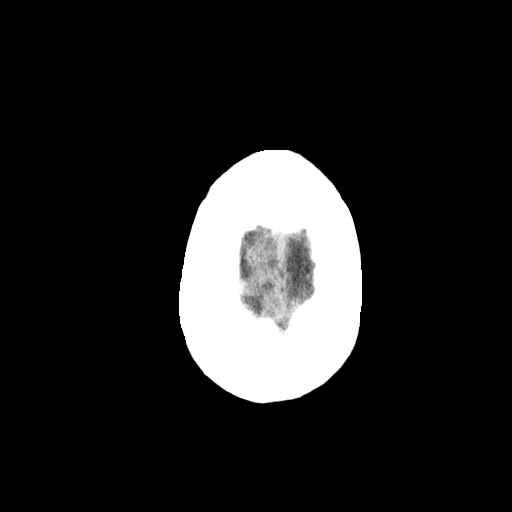
[im 29/32  bone]
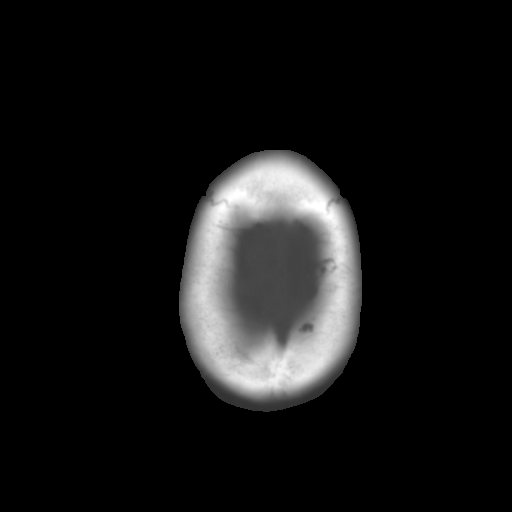

[Series 3: bone · axial · 0.45mm/px · z∈[-54,-10]mm · 3 of 32 slices shown]
[im 3/32  bone]
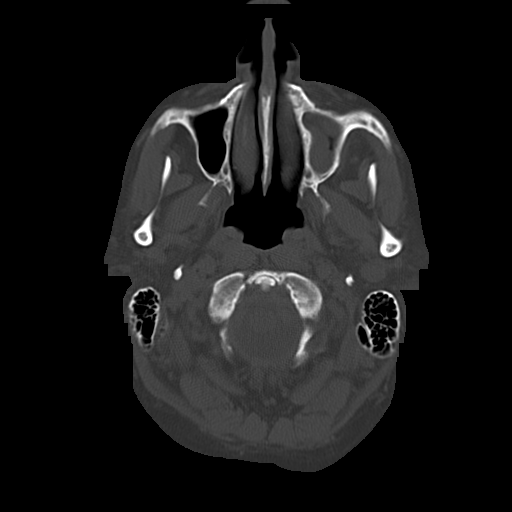
[im 7/32  bone]
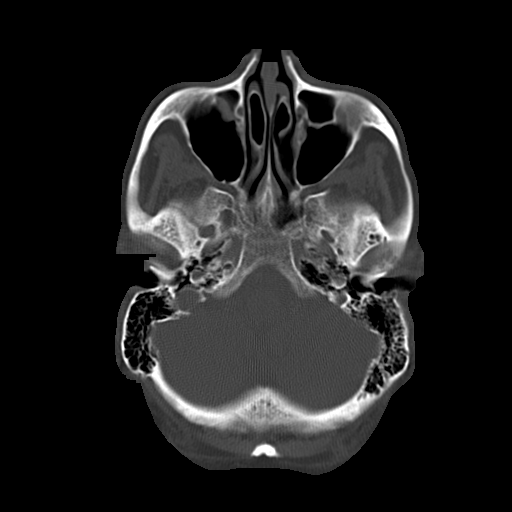
[im 12/32  bone]
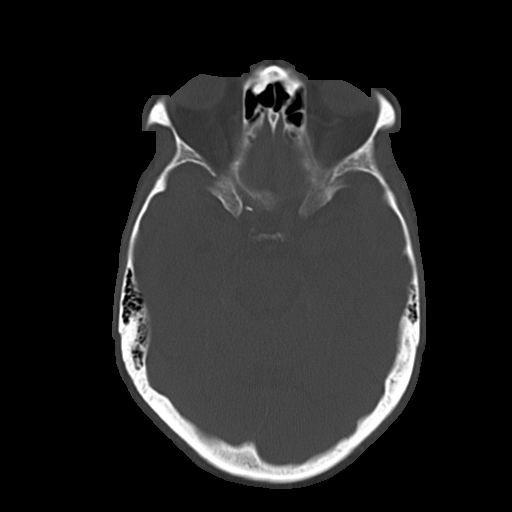

[16 of 30 positions shown; findings below may reference images not displayed]

PROCEDURE:     CT  - CT HEAD WITHOUT CONTRAST  - December 20, 2011  [DATE]

RESULT:     Axial noncontrast CT scanning was performed through the brain
with reconstructions at 5 mm intervals and slice thicknesses.

There is mild age-appropriate diffuse cerebral and cerebellar atrophy. The
ventricles are normal in size and position. There is no intracranial
hemorrhage nor intracranial mass effect. There is no evidence of an evolving
ischemic infarction. The cerebellum and brainstem are normal in density. At
bone window settings I do not see evidence of an acute skull fracture. There
may be a retention cyst or polyps in the maxillary sinuses and in the right
frontal sinus.
IMPRESSION: I see no acute intracranial abnormality. There are
age-related changes present.

A preliminary report was sent to the [HOSPITAL] the conclusion
of the study.

## 2014-04-24 IMAGING — CR DG CHEST 1V PORT
1 series · 1 of 1 positions shown · non-contrast
Comparison: none

REASON FOR EXAM: intubation
COMMENTS:

[ap]
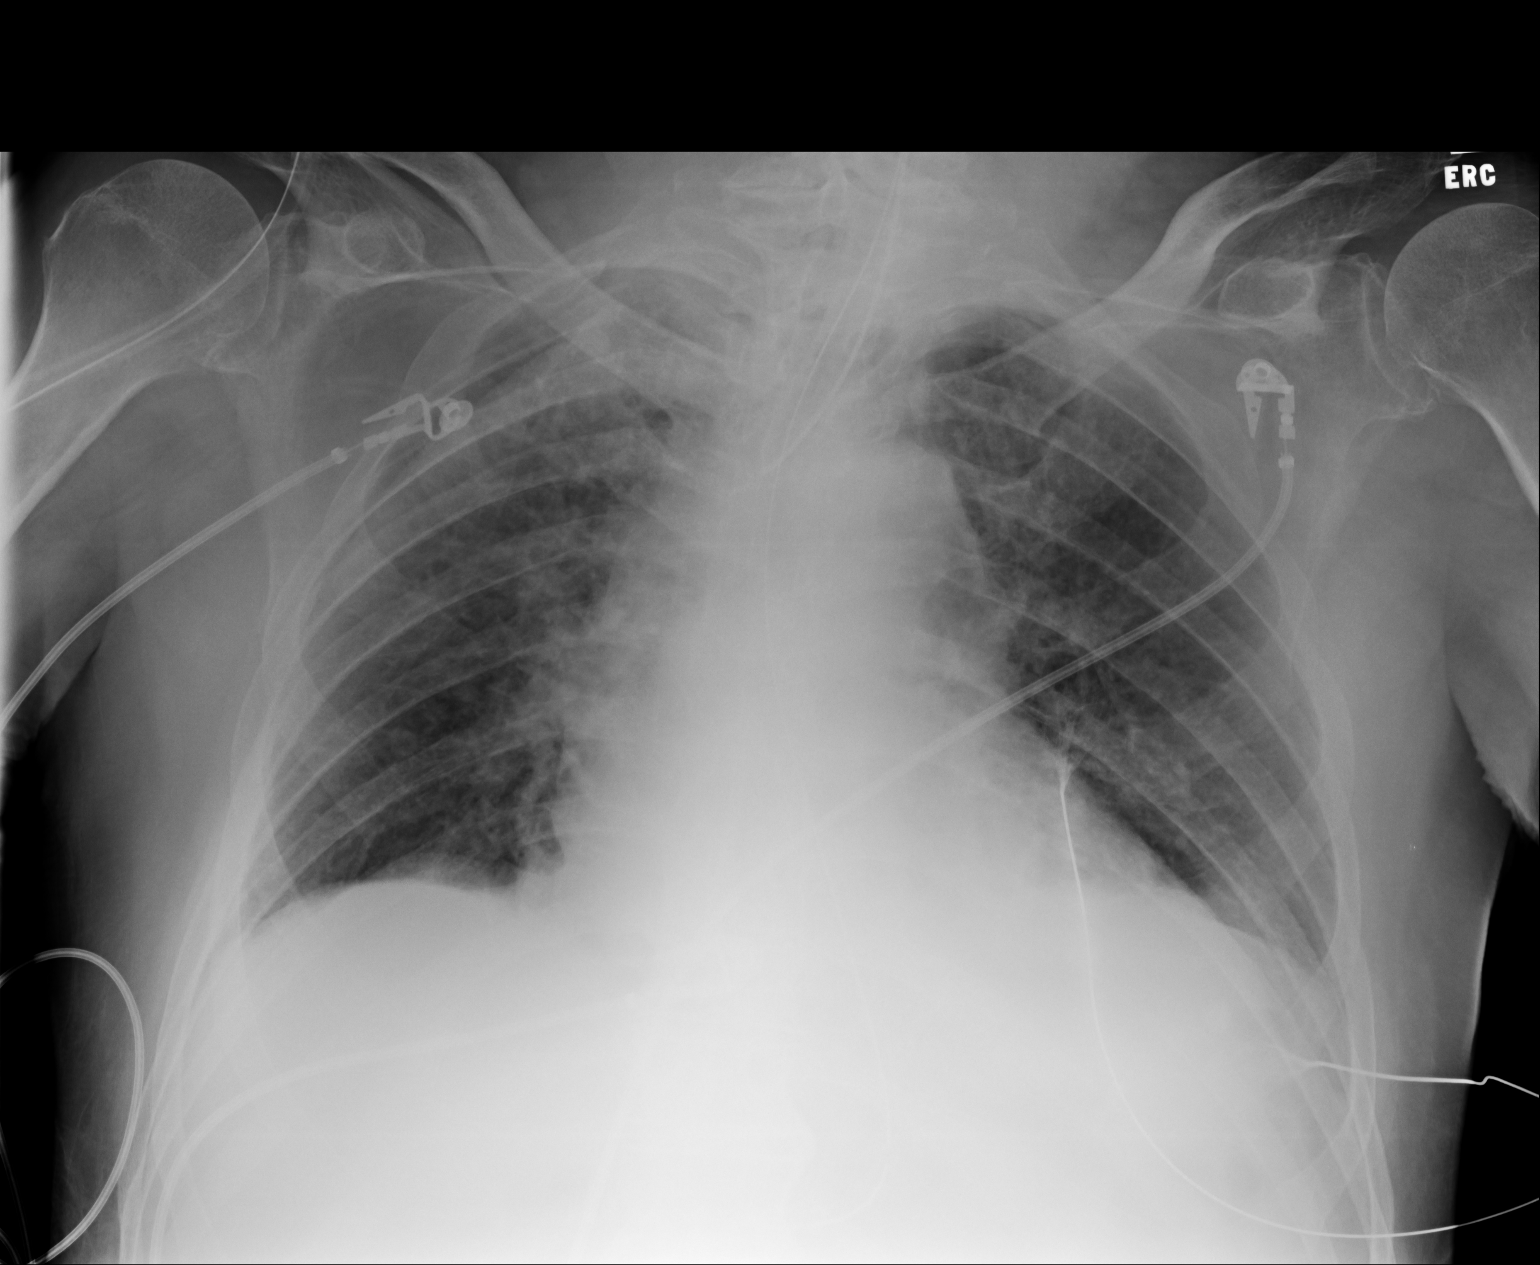

[1 of 1 positions shown; findings below may reference images not displayed]

PROCEDURE:     DXR - DXR PORTABLE CHEST SINGLE VIEW  - December 21, 2011  [DATE]

RESULT:     Comparison is made to the study 20 December, 2011 at [DATE] a.m.

The patient has undergone interval intubation of the trachea and of the
esophagus. The tip of the esophagogastric tube cannot be clearly
demonstrated. The endotracheal tube tip lies just below the inferior margin
of the clavicular heads. The lungs are mildly hypoinflated. The cardiac
silhouette is mildly enlarged. The pulmonary vascularity is indistinct
centrally.
IMPRESSION: The patient has undergone interval intubation of the
trachea with the tip of the tube lying just below the inferior margin of the
clavicular heads.

[REDACTED]

## 2014-10-29 NOTE — Discharge Summary (Signed)
PATIENT NAME:  Jack Stevens, CRALL MR#:  [id number] DATE OF BIRTH:  05-30-1922  DATE OF ADMISSION:  12/31/2011 DATE OF DISCHARGE:  01/06/2012  DISPOSITION: Patient will be discharged to [facility] for rehabilitation once [facility] is available.   PRESENTING COMPLAINT: Sent over from [facility] with abnormal labs.   DISCHARGE DIAGNOSES:  1. Acute on chronic renal failure likely related to obstruction given severe benign prostatic hypertrophy. Patient has chronic Foley at present, will continue Foley for now. Will need outpatient urology evaluation for possible transurethral resection of the prostate at a later point.   2. Seizure disorder thought related to infection versus withdrawal from alprazolam, stable, not on any antiseizure medications.   LABORATORY, DIAGNOSTIC AND RADIOLOGICAL DATA: Labs at discharge: Creatinine 1.29, glucose 95, rest of the electrolytes normal. Hemoglobin and hematocrit 9.7 and 29.1, white count 7.5, platelet count 228.   Ultrasound kidneys showed severe left and moderate right hydronephrosis. This is similar to prior. The Foley catheter balloon appears to be inflated within the prostate. Possibility of Foley balloon within the bladder with a severely thickened bladder wall was felt less likely. Repositioning is recommended.   Magnesium 2.0. Vitamin D 71. 25 hydroxy is 25.5.   Urinalysis cloudy. Urine culture no growth.   CONSULTATION: Urology consultation with Dr. Maryan Puls, recommends continue Foley catheter and follow up as outpatient.   BRIEF SUMMARY OF HOSPITAL COURSE: Mr. Dials is an 81 year old Caucasian gentleman with history of depression, history of chronic kidney disease stage III, MGUS, depression, anxiety, post-traumatic stress disorder, anemia, history of diverticulitis in the past presents to the Emergency Room from [facility] with abnormal labs. He was admitted with:  1. Acute on chronic renal failure secondary to postobstructive  uropathy secondary to massive benign prostatic hypertrophy. Foley was placed. He was seen by urology, Dr. Maryan Puls, and continued to do p.r.n. irrigation of blood clots which cleared up and hence irrigation was discontinued. Patient will follow up with Dr. Maryan Puls as outpatient for consideration of possible TURP as outpatient. Patient's creatinine on admission was 6.67, at discharge is 1.29  2. Recent history of seizure, was suspected due to infection versus withdrawal from alprazolam, not on any antiepileptics. Patient did not have any seizures in the hospital, remained stable.  3. Hypertension. Remains on metoprolol and amlodipine.  4. Remote use of tobacco abuse. He was continued on nicotine patch, however, patient says he does not have any more cravings. Nicotine patch is discontinued prior to discharge.   5. For his chronic depression he was continued on Zoloft.  6. Hospital stay otherwise was uncomplicated.   DISCHARGE INSTRUCTIONS:  1. Physical therapy.  2. Low sodium diet.  3. Foley care per protocol.  4. Follow up with Dr. Maryan Puls, urology, in a week to 10 days.   MEDICATIONS AT DISCHARGE:  1. Tylenol 650 mg p.o. every four hours p.r.n.  2. Norco 5/325, 1 to 2 every four hours p.r.n.  3. Protonix 40 mg daily.  4. Zofran 4 mg every six hours p.r.n.  5. Xanax 0.5 mg t.i.d.  6. Multivitamin p.o. daily.  7. Flomax 0.4 mg daily.  8. Avodart 0.5 mg daily.  9. Eucerin moisturizing cream apply to affected area.  10. Amlodipine 5 mg daily.  11. Zoloft 150 mg p.o. daily.  12. Ferrous sulfate 325 mg p.o. daily.  13. Metoprolol 25 mg b.i.d.   CODE STATUS: Patient remained a FULL CODE.   TIME SPENT: 40 minutes.   ____________________________  Tricia Pledger A. Posey Pronto, MD sap:cms D: 01/06/2012 11:12:09 ET T: 01/06/2012 14:48:13 ET JOB#: [id number]  cc: Nyeema Want A. Posey Pronto, MD, <Dictator> Otelia Limes. Yves Dill, MD L. Bernie Covey, MD Ilda Basset MD ELECTRONICALLY SIGNED 01/08/2012  14:10

## 2014-10-29 NOTE — Consult Note (Signed)
PATIENT NAME:  Jack Stevens, Jack Stevens MR#:  [id number] DATE OF BIRTH:  11/21/1921  DATE OF CONSULTATION:  01/01/2012  REFERRING PHYSICIAN:  Abel Presto, MD  CONSULTING PHYSICIAN:  Otelia Limes. Yves Dill, MD  REASON FOR CONSULTATION: Urinary retention and azotemia.   HISTORY OF PRESENT ILLNESS: Jack Stevens is an 81 year old Caucasian male admitted to the hospital from the nursing home with altered mental status. He was found to have elevated BUN and creatinine and Foley catheter was placed. He had postobstructive diuresis. His normal baseline PSA is less than 2 but at this time it is still over 4. He had ultrasound done of the kidneys which revealed bilateral hydronephrosis which was worse on the left side. The patient is a poor historian, is confused, and essentially could not give me a voiding history. However, his records indicate that he is on tamsulosin for benign prostatic hypertrophy.   ALLERGIES: No drug allergies.   CHRONIC MEDICATIONS:  1. Flomax. 2. Xanax. 3. Sertraline. 4. Multivitamins. 5. Iron supplement. 6. Lisinopril. 7. NicoDerm patches. 8. Aspirin. 9. Metoprolol.   PAST SURGICAL HISTORY:  1. Bilateral inguinal herniorrhaphy. 2. Vein stripping of the lower extremities.   SOCIAL HISTORY: The patient smokes more than 1 pack per day. He denied alcohol use. He is a resident of [facility].    FAMILY HISTORY: Negative for prostate cancer.   PAST AND CURRENT MEDICAL CONDITIONS:  1. Hypertension. 2. History of seizure disorder.  3. Depression.  4. Chronic anemia.  5. Diverticulosis.  6. Chronic renal insufficiency.  7. MGUS.   REVIEW OF SYSTEMS: The patient denied flank pain, history of hematuria prior to admission, history of kidney stones, or history of prior urological surgery.   PHYSICAL EXAMINATION:   GENERAL: He is a chronically ill appearing white male in no acute distress.   HEENT: Sclerae were clear. Pupils were equally round and reactive to light.  Extraocular movements intact.   NECK: Supple.   LUNGS: Clear to auscultation.   ABDOMEN: Abdomen was soft. Bladder was not palpably distended.   GU: Circumcised with buried penis. Testes are smooth and nontender, 20 mL size each.   RECTAL: Greater than 100 gram smooth, nontender prostate.   LABORATORY, DIAGNOSTIC, AND RADIOLOGICAL DATA: BUN was 63 and creatinine was 4.99 on June 27th. Renal ultrasound report was reviewed.   IMPRESSION:  1. Urinary retention secondary to massive benign prostatic hypertrophy.  2. Postobstructive diuresis.  3. Hematuria due to Foley trauma. 4. Bilateral hydronephrosis due to bladder outlet obstruction.  5. Acute on chronic renal failure.   SUGGESTIONS:  1. Leave Foley in place and irrigate as needed for clots. 2. Management options for urinary retention due to benign prostatic hypertrophy include Foley catheter with monthly exchanges, intermittent self cath, and surgery such as TURP or laser TURP. If the patient or family want surgery, then he will need to be cleared and stable for general anesthesia.   ____________________________ Otelia Limes. Yves Dill, MD mrw:drc D: 01/01/2012 18:10:11 ET T: 01/02/2012 05:56:18 ET JOB#: [id number]  cc: Otelia Limes. Yves Dill, MD, <Dictator> Valetta Close, MD Murlean Iba, MD Royston Cowper MD ELECTRONICALLY SIGNED 01/05/2012 7:37

## 2014-10-29 NOTE — H&P (Signed)
PATIENT NAME:  Jack Stevens, Jack Stevens MR#:  [id number] DATE OF BIRTH:  May 22, 1922  DATE OF ADMISSION:  12/20/2011  PRIMARY CARE PHYSICIAN: Dr.Bliss  REFERRING PHYSICIAN: Dr. Lovena Le    CHIEF COMPLAINT: Confusion and mental status changes for the past three days.   HISTORY OF PRESENT ILLNESS: The patient is an 81 year old Caucasian male with a history of benign prostatic hypertrophy, hypertension, anxiety, CKD, tobacco use, anemia, and MGUS who presented to the ED with the above chief complaint. The patient is awake but confused and unable to provide any detailed information. He does not know what happened to him and why he is here. According to his daughter, the patient was noted to have mental status change, confusion, for the past three days. The patient denies any symptoms. He was noted to have a urinary tract infection in the ED and was treated with Rocephin.  PAST MEDICAL HISTORY: As mentioned above: 1. Benign prostatic hypertrophy. 2. Anxiety.  3. Anemia. 4. AVM.   5. MGUS.  6. Tobacco abuse. 7. CKD.    PAST SURGICAL HISTORY:  1. Varicose vein surgery.  2. Inguinal hernia repair. 3. Endoscopy. 4. Colonoscopy.   SOCIAL HISTORY: Smokes a half pack a day for a long time. No alcohol or drug abuse.   FAMILY HISTORY: Mother died of MI. Father died of liver cancer.   ALLERGIES: None.   MEDICATIONS:  1. Flomax 0.4 mg p.o. daily.  2. Alprazolam 0.5 mg p.o. b.i.d.  3. Lopressor 25 mg p.o. b.i.d.  4. Sertraline 100 mg p.o. 1.5 tablets daily.   REVIEW OF SYSTEMS: The patient is confused. Denies any symptoms.   PHYSICAL EXAMINATION:   VITAL SIGNS: Temperature 97.4, blood pressure 137/65, pulse 70, respirations 20, oxygen saturation 95% on room air.   GENERAL: The patient is alert, awake but confused in no acute distress.   HEENT: Pupils are round, equal, reactive to light and accommodation. Moist oral mucosa. Clear oropharynx.   NECK: Supple. No JVD or carotid bruits. No  thyromegaly   CARDIOVASCULAR: S1, S2 regular rate and rhythm. No murmurs or gallops.   LUNGS: Bilateral air entry. There is some mild wheezing and mild rales bilateral base.   ABDOMEN: Soft. No distention or tenderness. No organomegaly. Bowel sounds present.   EXTREMITIES: There is trace edema bilateral lower extremities. No clubbing or cyanosis. No calf tenderness. Strong bilateral pedal pulses.   SKIN: No rash or jaundice.   NEUROLOGIC: The patient is awake, alert but confused, follows commands. No focal deficit. Power 5 out of 5. Sensation intact.   LABORATORY DATA: Urinalysis showed RBCs 76, WBCs 18, nitrite negative. Urine drug screen showed benzodiazepine positive. WBC 8.7, hemoglobin 12.7, platelets 182, glucose 81, BUN 31, creatinine 1.4. Electrolytes are normal. Troponin 0.02. TSH 0.930.   Chest x-ray atelectasis at left lung base. No pneumonia.   CAT scan of head no acute intracranial abnormality.   EKG showed sinus rhythm with first degree AV block at 66 beats per minute. No ST inversion which is similar to previous EKG in 2011.   IMPRESSION:  1. Altered mental status possibly due to urinary tract infection or medication.  2. Dehydration.  3. Tobacco abuse.  4. CKD.   5. Hypertension, controlled.  6. Anemia, stable.  7. History of MGUS. 8. Benign prostatic hypertrophy. 9. AVM.   PLAN OF TREATMENT:  1. The patient will be admitted to medical floor.  2. Will continue Rocephin. 3. Follow-up blood culture, urine culture.  4. We will give IV  fluid normal saline and follow-up BMP.  5. Continue Lopressor. 6. Add aspirin.   7. We will check lipid panel.  8. Hold alprazolam and sertraline due to altered mental status.   9. GI and DVT prophylaxis.   Discussed the patient's situation and plan of treatment with the patient's daughter.   TIME SPENT: About 60 minutes.   ____________________________ Demetrios Loll, MD qc:drc D: 12/20/2011 09:54:41 ET T: 12/20/2011 10:22:06  ET JOB#: [id number] cc: Demetrios Loll, MD, <Dictator> Demetrios Loll MD ELECTRONICALLY SIGNED 12/21/2011 14:49

## 2014-10-29 NOTE — Consult Note (Signed)
PATIENT NAME:  Jack Stevens, Jack Stevens MR#:  [id number] DATE OF BIRTH:  Feb 20, 1922  DATE OF CONSULTATION:  12/23/2011  REFERRING PHYSICIAN:  Demetrios Loll, MD  CONSULTING PHYSICIAN:  Drue Stager. Felicite Zeimet, MD  REFERRING PHYSICIAN: Severe agitation, mental status changes, history of anxiety.   HISTORY OF PRESENT ILLNESS: [name] is an 81 year old male admitted on 12/20/2011 with acute mental status changes.   Approximately four days prior to discharge, [name] began to manifest worsening of his memory as well as severe symptoms at night such as agitation, combativeness, thought disorganization and clouding of consciousness. He exhibited these same symptoms last night. Attempts to calm him psychosocially were not successful.   He does have a history of intrusive recollections of WWII experience as well as  feeling on edge, muscle tension. He has been treated with Zoloft 150 mg daily as an outpatient. He also has been given benzodiazepine therapy as an outpatient which has been Xanax 0.5 mg b.i.d. for muscle tension and feeling on edge. The Xanax has been discontinued. [name] did have a generalized seizure the night before last and was started on Dilantin 150 mg at bedtime with a follow-up therapeutic level.   Today when interviewed in the afternoon, [name] does not know the year or the month. He does know person and place. He does have some confabulation and difficulty with word finding. His judgment and insight are impaired; however, his mood is normal. 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 He is not having any hallucinations or delusions at the time of the interview. Please see the mental status exam.   PAST PSYCHIATRIC HISTORY: By his daughter's report Jack Stevens), the patient had two "nervous breakdowns" as part of his posttraumatic stress disorder. He does have a long-term history of hypervigilance, including feeling on edge,  muscle tension, intrusive recollections of the trauma, and treatment by the [facility].   Please see the history of present illness regarding his present psychotropic medication.   FAMILY PSYCHIATRIC HISTORY: None.   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 He has two daughters and at least one son. He does not drink alcohol, uses no illegal drugs. He does continue to smoke tobacco from time to time. Marital status: Widowed. 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   PAST MEDICAL HISTORY:  1. Chronic obstructive pulmonary disease.  2. History of arteriovenous malformation.  3. Anemia.  4. History of benign prosthetic hypertrophy.   PAST SURGICAL HISTORY:  1. Varicose vein surgery.   2. Inguinal hernia repair.   ALLERGIES: None.   MEDICATIONS: In the hospital, the [facility] was reviewed.  1. He is on Zoloft 150 mg daily.  2. He is on Dilantin 150 mg at bedtime.   LABORATORY, DIAGNOSTIC AND RADIOLOGICAL DATA:  Creatinine 1.55.  MRI of the brain showed diffuse atrophy. It also showed chronic ischemic changes.  Hemoglobin 11.4. Platelet count 131.  EKG: QTc on the 17th of June 450 ms.  Dilantin level on the 17th of June was 12.6.  Magnesium, TSH, SGOT, and SGPT were unremarkable.  Urine drug screen was positive for benzodiazepines (prescribed Xanax as an outpatient).  Albumin was decreased at 3.0.   REVIEW OF SYSTEMS: Constitutional, HEENT, mouth, neurologic, psychiatric, cardiovascular, respiratory, gastrointestinal, genitourinary, skin, musculoskeletal, hematologic, lymphatic, endocrine, and metabolic are all unremarkable.   PHYSICAL EXAMINATION:  VITAL SIGNS: Temperature 98.3, pulse 68, respiratory rate 22, blood pressure 168/84.  GENERAL APPEARANCE: [name] is an elderly male, partially reclined in a supine position in his hospital bed with no abnormal involuntary movements. He has no cachexia. Muscle tone is normal.   MENTAL STATUS  EXAMINATION: [name] has mild clouding of consciousness. His eye contact is intermittent. Concentration is decreased. On orientation testing, he misses the year, misses the month, misses the day of the week and the day of the month. He does know the town and the place. He is not able to recall the word hospital, but he states [facility] correctly as well as the town. Memory is three out of three words immediate and zero out of three on recall. He has difficulty also with recent memory, however, regarding remote memory he is intact. Fund of knowledge, use of language and intelligence are severely below that of his estimated premorbid baseline. Speech involves a flat prosody, slowness. There is no dysarthria. Thought process involves short coherent phrases. No looseness of association, no disorganization at the time of the interview. Thought content does involve confabulating. He has no thoughts of harming himself or others. He has no hallucinations or delusions during the interview. Affect is mildly anxious. Mood is normal. Insight is poor. Judgment is impaired.   ASSESSMENT:  AXIS I:  1. Delirium due to general medical problems.  2. Mood disorder due to general medical problems.  3. Posttraumatic stress disorder.   AXIS II: None.   AXIS III: Chronic obstructive pulmonary disease and anemia, see past medical history.   AXIS IV: General medical history of severe life-threatening combat trauma.   ____________________________ Drue Stager. Kiran Lapine, MD jsw:cbb D: 12/23/2011 20:36:03 ET T: 12/24/2011 07:03:09 ET JOB#: [id number]  cc: Drue Stager. Alyxandra Tenbrink, MD, <Dictator> Billie Ruddy MD ELECTRONICALLY SIGNED 12/29/2011 23:52

## 2014-10-29 NOTE — H&P (Signed)
PATIENT NAME:  Jack Stevens, Jack Stevens MR#:  [id number] DATE OF BIRTH:  August 30, 1921  DATE OF ADMISSION:  12/31/2011  REFERRING PHYSICIAN: Dr. Robet Leu PRIMARY CARE PHYSICIAN: Dr. Reyes Ivan   PRESENTING COMPLAINT: Sent over from [facility] with abnormal labs.   HISTORY OF PRESENT ILLNESS: Jack Stevens is an 81 year old gentleman with history of hypertension, benign prostatic hypertrophy, depression/anxiety, post traumatic stress disorder, MGUS, history of GI bleed, chronic kidney disease stage III who presents from [facility] after chemistry drawn showed elevated BUN and creatinine from baseline. Patient and his daughter are somewhat difficult historian. He clearly does not report any changes in his symptoms since he was discharged from the hospital on 12/24/2011. Denies any nausea or vomiting. No diarrhea. No hematuria or dysuria. He has had some increased urinary frequency. His energy has remained poor since his diagnosis of pneumonia back in January 2009. He gets short of breath when he gets anxious but again no changes from his baseline issues. He has been at [facility] awaiting a bed at the [place]. Patient denies any chest pain or syncope.   PAST MEDICAL HISTORY:  1. Admitted 06/15 to 12/24/2011 with altered mental status thought to be in the setting of urinary tract infection, dehydration, had complication of seizure requiring intubation for airway support. It was assumed seizure was possibly related to infection versus withdrawal from alprazolam. He is no longer on antiseizure medication.  2. Benign prostatic hypertrophy.  3. Hypertension.  4. Seizure as above.  5. Depression, anxiety and posttraumatic stress disorder.  6. Anemia, likely of chronic disease.  7. History of diverticulitis and AVM with history of gastrointestinal bleed.  8. MGUS.  9. Remote tobacco use.  10. Chronic kidney disease, stage III.   PAST SURGICAL HISTORY:  1. Varicose vein surgery.  2. Double hernia  repair.   ALLERGIES: No known drug allergies.   MEDICATIONS:  1. Flomax 0.4 mg daily.  2. Xanax 0.5 mg 1 tablet every eight hours as needed.  3. Sertraline 150 mg daily.  4. Multivitamin daily.  5. Iron tablet daily.  6. Lisinopril 10 mg daily, this was actually discontinued on 12/29/2011.  7. Nicoderm patch 7 mg per 24 hours daily.  8. Aspirin 81 mg daily.  9. Metoprolol tartrate 25 mg twice daily   SOCIAL HISTORY: Has not smoked cigarettes for the past two weeks, being on a nicotine patch. No alcohol or drug use. Currently at [facility] awaiting a bed at [place].    FAMILY HISTORY: Mother died of myocardial infarction. Father died of liver cancer.   REVIEW OF SYSTEMS: CONSTITUTIONAL: No fevers, nausea, vomiting. EYES: No visual changes. ENT: No epistaxis, discharge. RESPIRATORY: No cough, hemoptysis. CARDIOVASCULAR: No chest pain. He has chronic lower extremity edema but this appears to be a little bit worse from his baseline. No palpitations or syncope. GASTROINTESTINAL: No nausea, vomiting, diarrhea, abdominal pain, hematemesis, or melena. Patient was guaiac-positive in the ED. GENITOURINARY: No dysuria, hematuria. He has increased urinary frequency. ENDO: No polyuria or polydipsia. HEMATOLOGIC: He has easy bruising. SKIN: No ulcers but he does have an erythematous rash that developed in his medial leg since being at [facility]. MUSCULOSKELETAL: No joint pain or swelling. NEUROLOGIC: He had recent seizure as per history of present illness. PSYCH: Denies any suicidal ideation. He has a PTSD, depression, anxiety.    PHYSICAL EXAMINATION:  VITAL SIGNS: Temperature 98.3, pulse 64, respiratory rate 20, blood pressure 181/80, sating 97% on room air.  GENERAL: Lying in bed in no apparent distress.   HEENT: Normocephalic, atraumatic. Pupils are slightly dilated, nonicteric. Nares without discharge. He has actually moist mucous membrane.   NECK: Soft and supple. No adenopathy. He has  slightly elevated JVP of approximately 7 cm.   CARDIOVASCULAR: Non-tachy. No murmurs, rubs, or gallops.   LUNGS: Coarse breath sounds midlung to the base. No use of accessory muscles or increased respiratory effort.   ABDOMEN: Soft. Positive bowel sounds. No mass appreciated.   EXTREMITIES: 2+ pitting edema bilaterally. Dorsal pedis pulses difficult to palpate.   NEUROLOGIC: No dysarthria. Patient appears to have some cognitive impairment with memory. No asterixis. Nose to finger intact.   ASSESSMENT AND PLAN: Jack Stevens is an 81 year old gentleman with history of chronic kidney disease, stage III, MGUS, depression/anxiety, posttraumatic stress disorder, anemia, diverticulitis, gastrointestinal bleed and AVMs, remote tobacco use, hypertension, recent seizure on last admission representing from [facility] with abnormal lab findings.  1. Acute on chronic renal failure likely dehydration and ATN plus/minus progression of MGUS. Urinalysis with hematuria, likely traumatic. Will stop lisinopril. Gentle hydration, ins and outs, daily creatinine. Hold off on antibiotics for now and send urine cultures. Will obtain renal ultrasound and nephrology consultation. Will send a PTH level and will send vitamin D level.  2. Seizure thought to be related to infection versus withdrawal from alprazolam. No longer on antiepileptics. Continue on seizure precautions.  3. Hypertension, uncontrolled. Will resume metoprolol. Hold lisinopril as above. Guaiac in the ED was positive. Will stop aspirin for now.  4. Remote tobacco use. Has had cessation for the past two weeks and continue to encourage and continue nicotine patch.  5. Prophylaxis with TEDs, SCDs and Protonix.    TIME SPENT: Approximately 50 minutes spent on patient care.   ____________________________ Rita Ohara, MD ap:cms D: 12/31/2011 23:44:08 ET T: 01/01/2012 06:51:19 ET JOB#: [id number]  cc: Brien Few Ossiel Marchio, MD, <Dictator> Carlean Jews  Bernie Covey, MD Rita Ohara MD ELECTRONICALLY SIGNED 01/24/2012 0:13

## 2014-10-29 NOTE — Consult Note (Signed)
If delerium returns, start Seroquel at 25 mg, then continue dose q1700.IM required, use Geodon instead of Seroquel.  use Geodon 10 mg IM bid prn. For Geodon standing dose: 20 mg po or IM q 1700. for delerium/ sundowning can be tried off at the receiving facility after discharge. note to follow.  Electronic Signatures: Billie Ruddy (MD)  (Signed on 18-Jun-13 18:09)  Authored  Last Updated: 18-Jun-13 18:09 by Billie Ruddy (MD)

## 2014-10-29 NOTE — Consult Note (Signed)
Brief Consult Note: Diagnosis: Urinary retention. Massive BPH.  ARF/CRF. Post-obstructive diuresis. Hematuria due to Foley trauma.   Patient was seen by consultant.   Consult note dictated.   Recommend to proceed with surgery or procedure.   Recommend further assessment or treatment.   Orders entered.   Discussed with Attending MD.   Comments: Leave Foley in place and irrigate prn clots. Management options for urinary retention include Foley with monthly exchanges, intermittant self cath, and surgery (TURP, laser TURP). If patient wants surgery then he will need to stable/cleared for general anesthesia. Bilateral hydronephrosis is due bladder outlet obstruction by massive BPH.  Electronic Signatures: Royston Cowper (MD)  (Signed 27-Jun-13 17:58)  Authored: Brief Consult Note   Last Updated: 27-Jun-13 17:58 by Royston Cowper (MD)

## 2014-10-29 NOTE — Consult Note (Signed)
PATIENT NAME:  Jack Stevens, Jack Stevens MR#:  [id number] DATE OF BIRTH:  03/03/1922  DATE OF CONSULTATION:  12/23/2011  CONSULTING PHYSICIAN:  Drue Stager. Weslie Pretlow, MD  CONTINUATION:   The undersigned discussed the indications, alternatives and adverse effects of atypical antipsychotics, including Seroquel and Geodon. The conversation included the risks of deadly side affects. The conversation included the risk of diabetes mellitus, metabolic syndrome including hypercholesterolemia, and a nonreversible movement disorder. Anderson Malta understands and would like to proceed as below.   If Mr. Eastmond redevelops a pattern of symptoms in the evening as he manifested last night such as severe agitation, severe clouding of consciousness, thought disorganization, combativeness, easy distractibility, and/or hallucinations or delusions, would start Seroquel 25 mg as a first dose, then if tolerated would proceed with Seroquel 25 mg as the initial dosage at 17:00.  If IM is required, Geodon can be used at 10 mg IM b.i.d. p.r.n. for acute tranquilization, then the standing dose for resolving the episodes could be 20 mg p.o. at 17:00.   After Mr. Tschetter's acute general medical episode, if there is still suspicion regarding other organic etiology, the following central nervous system tests could be added: Ammonia, RPR, I14 and folic acid.   Regarding the length of the regimen of antipsychotic, if an antipsychotic needs to be started it should be continued until Mr. Maday has been moved to the placement facility and settled. Then he can be tried off the antipsychotic. The undersigned did mention to his daughter that a number of skilled nursing facilities do have psychiatric follow-up on site.   Again, the daughter on the phone for informed consent was Power of [employer], phone number [phone].   ____________________________ Drue Stager. Houda Brau, MD jsw:cbb D: 12/23/2011 20:42:14 ET T: 12/24/2011  07:21:10 ET JOB#: [id number] Billie Ruddy MD ELECTRONICALLY SIGNED 12/29/2011 23:52

## 2014-10-29 NOTE — Consult Note (Signed)
Referring Physician:  Demetrios Loll :   Reason for Consult:  Admit Date: 22-Dec-2011   Chief Complaint: ams   History of Present Illness:  History of Present Illness:   81 yo RHD M presents to ER secondary to confusion for 3 days.  Pt was admitted and noted to have an urinary tract infection and mild renal insuffiency.  While inpatient, pt had a seizure and was intubated.  He was placed on fosphenytoin and no more seizures.  His baseline is that he lives alone and pays all of his bills and does all ADL's.  ROS:   General denies complaints    HEENT no complaints    Lungs no complaints    Cardiac no complaints    GI no complaints    GU no complaints    Musculoskeletal no complaints    Extremities no complaints    Skin no complaints    Neuro no complaints    Endocrine no complaints    Psych anxiety   Past Medical/Surgical Hx:  GI Bleed:   Pneumonia:   Enlarged Prostate:   Anemia:   anxiety:   Past Medical/ Surgical Hx:   Past Medical History as above, depression, HTN    Past Surgical History as above   Home Medications: Medication Instructions Last Modified Date/Time  metoprolol succinate 25 mg oral tablet, extended release 1  orally 2 times a day  16-Jun-13 12:38  Flomax 0.4 mg oral capsule 1  orally once a day  16-Jun-13 12:38  sertraline 100 mg oral tablet 1.5 tab(s) orally once a day 16-Jun-13 12:38  alprazolam 0.5 mg oral tablet 1 tab(s) orally 3 times a day 16-Jun-13 12:38  multivitamin  orally once a day 16-Jun-13 12:38  iron tablet   once a day 16-Jun-13 12:38   Allergies:  No Known Allergies:   Social/Family History:  Employment Status: retired   Lives With: alone   Living Arrangements: house   Social History: smokes, no illicits, no alcohol   Family History: n/c   Vital Signs: **Vital Signs.:   17-Jun-13 10:00   Vital Signs Type Routine   Temperature Temperature (F) 98.9   Celsius 37.1   Temperature Source oral   Pulse Pulse 72    Respirations Respirations 17   Systolic BP Systolic BP 761   Diastolic BP (mmHg) Diastolic BP (mmHg) 67   Mean BP 94   Pulse Ox % Pulse Ox % 95   Pulse Ox Activity Level  At rest   Oxygen Delivery 6L   Pulse Ox Heart Rate 72   Physical Exam:  General: alert, no acute distress, normal weight   HEENT: normocephalic, sclera nonicteric, oropharynx clear   Neck: supple, no JVD, no bruits   Chest: CTA B, no wheezes, good movement   Cardiac: RRR, no murmurs, no edema, 2+ pulses   Extremities: no C/C/E, FROM   Neurologic Exam:  Mental Status: intubated, sedation held, follows all commands quickly, eyes open to voice, GCS 10T   Cranial Nerves: PERRLA, EOMI, nl VF, nl corneal, nl cough   Motor Exam: 5-/5 B normal, tone, no tremor   Deep Tendon Reflexes: 2+/4 B, plantars downgoing B, no Hoffman   Sensory Exam: responses to pain bilateral   Coordination: FTN normal B   Gait: untestable   Lab Results: Thyroid:  15-Jun-13 06:36    Thyroid Stimulating Hormone 0.930 (0.45-4.50 (International Unit)  ----------------------- Pregnant patients have  different reference  ranges for TSH:  - - - - - - - - - -  Pregnant, first trimetser:  0.36 - 2.50 uIU/mL)  Hepatic:  15-Jun-13 06:36    Bilirubin, Total 0.2   Alkaline Phosphatase 63   SGPT (ALT) 18 (12-78 NOTE: NEW REFERENCE RANGE 05/30/2011)   SGOT (AST) 30   Total Protein, Serum 8.2   Albumin, Serum  3.0  Routine Micro:  15-Jun-13 07:33    Specimen Source CC    09:51    Micro Text Report BLOOD CULTURE   COMMENT                   NO GROWTH IN 48 HOURS   ANTIBIOTIC                        Culture Comment NO GROWTH IN 48 HOURS  Result(s) reported on 22 Dec 2011 at 07:16AM.  Routine Chem:  15-Jun-13 07:33    Result Comment BENZODIAZEPINE - RESULTS VERIFIED BY REPEAT TESTING.  Result(s) reported on 20 Dec 2011 at 08:52AM.  16-Jun-13 04:12    Cholesterol, Serum 123   Triglycerides, Serum 122   HDL (INHOUSE)  36   VLDL  Cholesterol Calculated 24   LDL Cholesterol Calculated 63 (Result(s) reported on 21 Dec 2011 at 04:57AM.)   Magnesium, Serum 1.9 (1.8-2.4 THERAPEUTIC RANGE: 4-7 mg/dL TOXIC: > 10 mg/dL  -----------------------)  17-Jun-13 04:56    Glucose, Serum 91   BUN 18   Creatinine (comp)  1.36   Sodium, Serum 144   Potassium, Serum 3.5   Chloride, Serum  114   CO2, Serum 24   Calcium (Total), Serum  7.9   Anion Gap  6   Osmolality (calc) 288   eGFR (African American)  54   eGFR (Non-African American)  46 (eGFR values <39m/min/1.73 m2 may be an indication of chronic kidney disease (CKD). Calculated eGFR is useful in patients with stable renal function. The eGFR calculation will not be reliable in acutely ill patients when serum creatinine is changing rapidly. It is not useful in  patients on dialysis. The eGFR calculation may not be applicable to patients at the low and high extremes of body sizes, pregnant women, and vegetarians.)  Urine Drugs:  [id number]-32067:12   Tricyclic Antidepressant, Ur Qual (comp) NEGATIVE (Result(s) reported on 20 Dec 2011 at 08:52AM.)   Amphetamines, Urine Qual. NEGATIVE   MDMA, Urine Qual. NEGATIVE   Cocaine Metabolite, Urine Qual. NEGATIVE   Opiate, Urine qual NEGATIVE   Phencyclidine, Urine Qual. NEGATIVE   Cannabinoid, Urine Qual. NEGATIVE   Barbiturates, Urine Qual. NEGATIVE   Benzodiazepine, Urine Qual. POSITIVE (----------------- The URINE DRUG SCREEN provides only a preliminary, unconfirmed analytical test result and should not be used for non-medical  purposes.  Clinical consideration and professional judgment should be  applied to any positive drug screen result due to possible interfering substances.  A more specific alternate chemical method must be used in order to obtain a confirmed analytical result.  Gas chromatography/mass spectrometry (GC/MS) is the preferred confirmatory method.)   Methadone, Urine Qual. NEGATIVE  Cardiac:  15-Jun-13  06:36    Troponin I 0.02 (0.00-0.05 0.05 ng/mL or less: NEGATIVE  Repeat testing in 3-6 hrs  if clinically indicated. >0.05 ng/mL: POTENTIAL  MYOCARDIAL INJURY. Repeat  testing in 3-6 hrs if  clinically indicated. NOTE: An increase or decrease  of 30% or more on serial  testing suggests a  clinically important change)  Routine UA:  15-Jun-13 07:33    Color (UA) Yellow   Clarity (UA)  Hazy   Glucose (UA) Negative   Bilirubin (UA) Negative   Ketones (UA) Negative   Specific Gravity (UA) 1.016   Blood (UA) 2+   pH (UA) 5.0   Protein (UA) Negative   Nitrite (UA) Negative   Leukocyte Esterase (UA) 1+ (Result(s) reported on 20 Dec 2011 at 08:09AM.)   RBC (UA) 76 /HPF   WBC (UA) 18 /HPF   Bacteria (UA) NONE SEEN   Epithelial Cells (UA) <1 /HPF   Mucous (UA) PRESENT (Result(s) reported on 20 Dec 2011 at 08:09AM.)  Routine Hem:  17-Jun-13 04:56    WBC (CBC) 7.0   RBC (CBC)  3.52   Hemoglobin (CBC)  11.5   Hematocrit (CBC)  34.3   Platelet Count (CBC)  144   MCV 97   MCH 32.5   MCHC 33.4   RDW  14.6   Neutrophil % 82.0   Lymphocyte % 9.0   Monocyte % 7.8   Eosinophil % 0.9   Basophil % 0.3   Neutrophil # 5.7   Lymphocyte #  0.6   Monocyte # 0.5   Eosinophil # 0.1   Basophil # 0.0 (Result(s) reported on 22 Dec 2011 at 05:26AM.)   Radiology Results: CT:    15-Jun-13 05:44, CT Head Without Contrast   CT Head Without Contrast    REASON FOR EXAM:    ams  COMMENTS:       PROCEDURE: CT  - CT HEAD WITHOUT CONTRAST  - Dec 20 2011  5:44AM     RESULT: Axial noncontrast CT scanning was performed through the brain   with reconstructions at 5 mm intervals and slice thicknesses.    There is mild age-appropriate diffuse cerebral and cerebellar atrophy.   The ventricles are normal in size and position. There is no intracranial   hemorrhage nor intracranial mass effect. There is no evidence of an   evolving ischemic infarction. The cerebellum and brainstem are normal in    density. At bone window settings I do not see evidence of an acute skull   fracture. There may be a retention cyst or polyps in the maxillary   sinuses and in the right frontal sinus.  IMPRESSION:  I see no acuteintracranial abnormality. There are   age-related changes present.    A preliminary report was sent to the emergency department at the   conclusion of the study.          Verified By: DAVID A. Martinique, M.D., MD    16-Jun-13 21:40, CT Head Without Contrast   CT Head Without Contrast    REASON FOR EXAM:    seizure, r/o bleed  COMMENTS:       PROCEDURE: CT  - CT HEAD WITHOUT CONTRAST  - Dec 21 2011  9:40PM     RESULT: Axial noncontrast CT scanning was performed through the brain   with reconstructions at 5 mm intervals and slice thicknesses. Comparison   is made to the study of 20 December 2011.    There is mild diffuse cerebral and cerebellar atrophy. The ventricles are   normal in size and position. There is no intracranial hemorrhage nor   intracranial mass effect. There is no evidence of an evolving ischemic   infarction. I see no abnormal intracranial calcifications. The cerebellum   and brainstem are normal in density.    The patient is intubated. There is no evidence of an acute skull   fracture. The observed portions of the paranasal sinuses exhibit  retention cysts or polyps in the maxillary sinuses.    IMPRESSION:   1. There has been interval intubation of the trachea since the earlier   study.  2. I do not see evidence of an intracranial hemorrhage nor other acute  intracranial abnormality.     Dictation Site: 5          Verified By: DAVID A. Martinique, M.D., MD   Impression/Recommendations:  Recommendations:   labs reviewed show mild UTI and mild CKI/AKI scan personally reviewed by me and does not show any hemorrhage or new stroke, moderate atrophy and minimal white matter disease d/w referring physician at lenght   New onset seizure-  etiology is  unclear and could be idiopathic, doubt that mild UTI caused this.  This could have been provoked by Xanax withdrawal.  Serotonin syndrome can predispose him to seizures as well but he does not have the classic signs such as high fever. Alerted mental status-  this was the chief presentation that occured 3 days before seizure and the cause is not known.  This could have been small complex partial seizures, encephalopathy, etc.  We do not believe that UTI and mild renal dysfunction was enough to cause this.  As stated above, there is no clear cause of this so he needs a little more evaluation Urinary tract infection-  treated Depression/Anxiety-  somewhat controlled continue fosphenytoin 182m q8h, will check level and change to PO tomorrow MRI of brain w/o contrast to look for cause of AMS agree with EEG continue to hold Xanax and Zoloft pt is now extubated so should be able to get more history tomorrow AM will follow  Electronic Signatures: SJamison Neighbor(MD)  (Signed 17-Jun-13 10:30)  Authored: REFERRING PHYSICIAN, Consult, History of Present Illness, Review of Systems, PAST MEDICAL/SURGICAL HISTORY, HOME MEDICATIONS, ALLERGIES, Social/Family History, NURSING VITAL SIGNS, Physical Exam-, LAB RESULTS, RADIOLOGY RESULTS, Recommendations   Last Updated: 17-Jun-13 10:30 by SJamison Neighbor(MD)

## 2014-10-29 NOTE — Discharge Summary (Signed)
PATIENT NAME:  Jack Stevens, WIX MR#:  [id number] DATE OF BIRTH:  08-02-1921  DATE OF ADMISSION:  12/20/2011 DATE OF DISCHARGE:  12/24/2011  PRIMARY CARE PHYSICIAN: Dr. Clemmie Krill   CONSULTING PHYSICIANS:  1. Dr. Tamala Julian, neurology.  2. Psychiatry, Dr. Felizardo Hoffmann.  3. Pulmonary, Dr. Mortimer Fries.  4. Palliative care, Dr. Ermalinda Memos.   DISCHARGE DIAGNOSES:  1. Altered mental status, possibly due to urinary tract infection, dehydration.  2. Urinary tract infection.  3. Dehydration and chronic kidney disease, stage III.  4. Seizure with temporary respiratory failure.  5. Hypertension.  6. Anemia.  PROCEDURE: Intubation and ventilation.   CONDITION: Stable.   HOME MEDICATIONS:  1. Lopressor 25 mg p.o. b.i.d.  2. Flomax 0.4 mg p.o. daily.  3. Sertraline 100 mg 1.5 tablets p.o. daily.  4. Multivitamin 1 tablet p.o. daily. 5. Iron tablet 1 tablet p.o. daily.   ADDITIONAL MEDICATIONS:  1. Augmentin 875 mg p.o. b.i.d. for three days.  2. Lisinopril 10 mg p.o. daily.  3. Aspirin 81 mg p.o. daily.   MEDICATION TO STOP: Alprazolam 0.5 mg p.o. t.i.d.   DIET: Low sodium diet.   ACTIVITY: As tolerated.   FOLLOW-UP CARE:  1. Follow up with PCP, Dr. Clemmie Krill, within one week.  2. Follow up with Dr. Felizardo Hoffmann, psychiatry, within 1 to 2 weeks.   REASON FOR ADMISSION: Confusion and mental status change for three days.   HOSPITAL COURSE: Patient is an 81 year old Caucasian male with history of hypertension, anxiety, benign prostatic hypertrophy, chronic kidney disease, tobacco use, anemia and PTSD who presented to ED with:  1. Confusion and mental status change. Patient was noted to have confusion for three days and was noted to have a urinary tract infection in ED and was treated with Rocephin and admitted for urinary tract infection and dehydration. For detailed history and physical examination and labs please refer to the admission note dictated by me. After admission patient has been treated  with Rocephin. Blood culture, urine culture is negative. In addition, patient has dehydration with chronic kidney disease, has been treated with IV fluid support. Dehydration became better. On the second day after admission patient developed [place] seizure. To protect airway patient was intubated and placed on ventilation. Patient was treated with IV fosphenytoin and seizure protection. Dr. Tamala Julian, neurologist, evaluated the patient, recommend change to p.o. Dilantin after extubation. Dr. Tamala Julian suggested that the seizure only one episode was possibly induced by urinary tract infection or withdrawal of alprazolam. He recommended hold alprazolam but continue Zoloft. Psychiatrist, Dr. Rhona Raider, evaluated patient, recommended patient has delirium possibly due to general medical problem. Patient may have cognitive deficit but need follow up as outpatient. After extubation patient's mental status has become better. He is alert, awake, oriented in no acute distress but looks a little bit confused.  2. Hypertension. Patient's blood pressure was controlled initially but today patient's blood pressure increased to 190. In addition to Lopressor we will add lisinopril and give hydralazine IV p.r.n. If blood pressure is controlled patient will be discharged to nursing home today.  3. In addition, patient has anemia, which is stable.  4. In general patient is clinically stable. He will be discharged to nursing home if blood pressure is controlled.   Discussed the discharge plan with patient, nurse and case manager and Dr. Tamala Julian.   TIME SPENT: About 49 minutes.  ____________________________ Demetrios Loll, MD qc:cms D: 12/24/2011 10:31:37 ET T: 12/24/2011 10:46:40 ET JOB#: [id number]  cc: Demetrios Loll, MD, <Dictator> L.  Bernie Covey, MD  Demetrios Loll MD ELECTRONICALLY SIGNED 12/24/2011 19:24

## 2018-02-17 ENCOUNTER — Encounter: Payer: Self-pay | Admitting: Internal Medicine
